# Patient Record
Sex: Male | Born: 1965
Health system: Southern US, Community
[De-identification: ages and names within clinical notes are randomized; demographics above are authoritative.]

## PROBLEM LIST (undated history)

## (undated) DIAGNOSIS — I10 Essential (primary) hypertension: Secondary | ICD-10-CM

## (undated) DIAGNOSIS — E119 Type 2 diabetes mellitus without complications: Secondary | ICD-10-CM

## (undated) DIAGNOSIS — I4891 Unspecified atrial fibrillation: Secondary | ICD-10-CM

## (undated) DIAGNOSIS — E785 Hyperlipidemia, unspecified: Secondary | ICD-10-CM

## (undated) DIAGNOSIS — G4733 Obstructive sleep apnea (adult) (pediatric): Secondary | ICD-10-CM

## (undated) HISTORY — PX: TONSILLECTOMY AND ADENOIDECTOMY: SUR1326

## (undated) HISTORY — DX: Essential (primary) hypertension: I10

## (undated) HISTORY — DX: Obstructive sleep apnea (adult) (pediatric): G47.33

## (undated) HISTORY — PX: TOTAL KNEE ARTHROPLASTY: SHX125

## (undated) HISTORY — PX: APPENDECTOMY: SHX54

---

## 1992-04-29 HISTORY — PX: VASECTOMY: SHX75

## 2006-01-15 ENCOUNTER — Ambulatory Visit (HOSPITAL_BASED_OUTPATIENT_CLINIC_OR_DEPARTMENT_OTHER): Admission: RE | Admit: 2006-01-15 | Discharge: 2006-01-15 | Payer: Self-pay | Admitting: Orthopedic Surgery

## 2007-04-30 HISTORY — PX: PALATE / UVULA BIOPSY / EXCISION: SUR128

## 2009-05-05 ENCOUNTER — Ambulatory Visit: Payer: Self-pay | Admitting: Cardiovascular Disease

## 2009-05-23 ENCOUNTER — Telehealth: Payer: Self-pay | Admitting: Cardiovascular Disease

## 2009-05-29 ENCOUNTER — Ambulatory Visit: Payer: Self-pay | Admitting: Cardiovascular Disease

## 2010-05-29 NOTE — Progress Notes (Signed)
  Phone Note Outgoing Call   Call placed by: Rhett Bannister LPN Call placed to: Patient Summary of Call: LM with daughter...ultrasound of heart was normal per Dr. Zenia Resides.Marland KitchenMarland Kitchen

## 2010-09-11 NOTE — Assessment & Plan Note (Signed)
[facility] OFFICE NOTE   NAME:[name], [name]                          MRN:          [id number]  DATE:05/29/2009                            DOB:          Dec 14, 1965    PROBLEM LIST:  1. Hypertension.  2. Obstructive sleep apnea.  3. Family history of premature coronary artery disease.  4. History of tobacco use, now quit for 1 year.   INTERVAL HISTORY:  The patient states since his last visit, he has not  had anymore episodes of the fleeting chest pain and his arm discomfort.  He remains very active at work and tolerates the activity very well.  He  also denies any dyspnea on exertion.  He states during a stress test, he  exercised for as long as he could and stopped secondary to burning in  his legs and some dyspnea.  The patient states that he is very motivated  to lose weight as he has done so in the past and he has felt more  psychologically equipped since he has regained employment.   PHYSICAL EXAMINATION:  VITAL SIGNS:  His blood pressure is 133/79, his  pulse is 71, he is sating 98% on room air, and he weighs 289 pounds.  GENERAL:  He is in no acute distress.  HEENT:  Normocephalic, atraumatic.  NECK:  Supple.  HEART:  Regular rate and rhythm with distant heart sounds, but no  murmur.  LUNGS:  Clear bilaterally.  ABDOMEN:  Soft, nontender, but obese.  EXTREMITIES:  Without edema.  SKIN:  Warm and dry.  PSYCHIATRIC:  The patient is appropriate with normal levels of insight.   Review of the patient's echocardiogram dated January 18, his ejection  fraction 65-70%.  There was mild concentric left ventricular hypertrophy  and no significant valvular abnormalities.  Review of the patient's  stress test, the EKG portion of the stress test indicates that he  exercised for only a minute and half.  However, he states that it was  end-stage III at a Bruce protocol and greater than 7 METs.  I suspect  that  the time of exercise is a typo.  There were no EKG changes  suggestive of ischemia and he did not have any chest discomfort during  the test.  The nuclear portion of the stress test indicated an ejection  fraction of 49% and perhaps some ventricular dilatation.  However, the  echocardiogram showed a normal-sized ventricle.  Of note, the stress  test did not speak of transient ischemic dilatation, simply referred to  a somewhat dilated ventricle.  The nuclear portion also indicated that  there may be some ischemia apically.   EKG taken today in clinic independently interpreted by myself  demonstrates normal sinus rhythm with T-wave inversion in the anterior  and lateral leads.  There is borderline criteria for left ventricular  hypertrophy   ASSESSMENT:  45 year old white male with hypertension, family history of  premature coronary artery disease, and history of tobacco use who  appears to be asymptomatic from a cardiovascular standpoint.  His EKG  abnormalities may  be secondary to left ventricular hypertrophy, although  his stress test did note a low normal ejection fraction in the  echocardiogram which would be more accurate in this regard showed his EF  and left ventricular chamber size to be completely within normal limits.  The possible apical ischemia is likely to be false positive as there is  usually dropout near the apex and the patient's obese abdomen may be  contributing to this.  Most importantly the patient is not having any  symptoms currently.   PLAN:  We will need to obtain an old EKG to see if these T-wave  abnormalities are new or chronic.  I will also ask for the EKG portion  the stress test to be sent to the office for my review to clarify the  actual amount of time that he exercised into the Bruce protocol.  Should  the EKG changes be chronic and if he exercised a reasonable amount of  time, I feel that we can stop the ischemia evaluation now.  However, if  the  EKG changes are completely normal and his exercise capacity was  suboptimal, we would likely proceed with a left heart catheterization.  For now, he should continue on current antihypertensive therapy which is  showing his blood pressure very well.  He should also continue on baby  aspirin.  His hemoglobin A1c was elevated 6.4 and he is told that  exercise, dietary modification, and weight loss are necessary and would  likely correct his impaired fasting glucose.  We will contact the  patient later in a week once we have old EKGs and a copy of the stress  test.  He is given a refill for losartan today.      Arlee Muslim, MD  Electronically Signed    SGA/MedQ  DD: 05/29/2009  DT: 05/29/2009  Job #: [id number]

## 2010-09-11 NOTE — Letter (Signed)
May 05, 2009    Ann Held, M.D.  [address]  [place], [place]  [address]   RE:  RODRICUS, DEDRICK  MRN:  [id number]  /  DOB:  13-Sep-1965   Dear Dr. Nicki Reaper:   I saw Prentice Docker in clinic this morning.  As you know, he is a 45-year-  old gentleman with obstructive sleep apnea and hypertension, who on a  recent EKG had criteria consistent with left ventricular hypertrophy and  also had some ST-T wave abnormalities.  These are likely repolarization  abnormalities secondary to left ventricular hypertrophy.  The patient  conveys symptoms of chest discomfort which, albeit are atypical, warrant  an ischemia evaluation, especially in light of his cardiovascular risk  factors of tobacco use, hypertension, and family history of premature  coronary disease.   We will proceed with an exercise Cardiolite and a transthoracic  echocardiogram.  I have also started the patient on Maxzide 37.5/25 mg  today, in addition to losartan that you had put him on late last month.  His blood pressure was 158/99 today.   He was counseled at length regarding the need for lifestyle modification  and understands this and is willing to be an active participant in the  process.   Thank you for the referral of this patient and I look forward to  following him along with you.    Sincerely,      Arlee Muslim, MD  Electronically Signed    SGA/MedQ  DD: 05/05/2009  DT: 05/05/2009  Job #: [id number]

## 2010-09-11 NOTE — Assessment & Plan Note (Signed)
[facility] OFFICE NOTE   NAME:[name] [name], [name] [name]                          MRN:          [id number]  DATE:05/05/2009                            DOB:          [birth year]    CHIEF COMPLAINT:  Abnormal EKG.   HISTORY OF PRESENT ILLNESS:  [name] [name] is a 45 year old white male with  past medical history significant for obstructive sleep apnea,  hypertension, gastroesophageal reflux disease, who is presenting for  evaluation of some atypical chest discomfort and an abnormal EKG.  The  patient has had a cardiovascular workup about 3-5 years ago including a  stress test and an echo that was within normal limits.  Recently, his  primary care physician, Dr. Nicki Reaper, noted an EKG that indicated probable  left ventricular hypertrophy and abnormal T-waves in ST segments.  Today, the patient states that over the past few months he has had  occasional sharp, fleeting chest, and left arm discomfort.  There are no  associated symptoms and they are not necessarily related to exertion.  The patient also noticed increased dyspnea on exertion, however, he has  recently gained approximately 20 pounds.  He was laid off from work and  became depressed.  Since that time, he has regained employment and has  felt better psychologically over the past 4 months.  The patient also  states that if he stands up too quickly he feels pulsations throughout  his body and then sometimes gets lightheaded.   PAST MEDICAL HISTORY:  As above in the HPI.  It should be noted the  patient uses CPAP regularly for obstructive sleep apnea and has greatly  improved his symptoms.   SOCIAL HISTORY:  The patient stopped smoking in May after some  hyperplasia was noticed on his tongue.  He does not drink significant  amounts of alcohol.   FAMILY HISTORY:  He was told that his father had a myocardial infarction  at the age of 2 and subsequently had several other  heart attacks.   ALLERGIES:  No known drug allergies.   MEDICATIONS:  Aspirin 81 mg daily and losartan 100 mg daily both of  which were started at the end of December by Dr. Nicki Reaper.   REVIEW OF SYSTEMS:  As in the HPI.  The patient also endorses some  discoloration on his lower extremities and some occasional tingling in  his toes and feet.  He has arthritis in his knees, occasional reflux,  migraine headaches.  Other systems as in HPI, otherwise negative.   PHYSICAL EXAMINATION:  VITAL SIGNS:  Blood pressure in the right arm  158/99, left arm 155/91, pulse 69, weighs 288 pounds, and he is sating  96% on room air.  GENERAL:  No acute distress.  HEENT:  Normocephalic, atraumatic.  NECK:  Large but supple.  There is 2+ carotid pulses.  There are no  carotid bruits or JVD.  HEART:  Regular rate and rhythm without murmur, rub, or gallop.  LUNGS:  Clear bilaterally.  ABDOMEN:  Soft, nontender, nondistended.  EXTREMITIES:  Without edema.  SKIN:  Warm and  dry.  NEUROLOGIC:  Nonfocal.  MUSCULOSKELETAL:  5/5 bilateral upper and lower extremity strength.  Pulses 2+ bilateral, carotid, radial, and dorsalis pedis pulses.   EKG from today independently interrupted by myself indicates normal  sinus rhythm with borderline criteria for left ventricular hypertrophy  with diffuse T-wave inversions that are likely secondary to  repolarization abnormality.  However, ischemia cannot be ruled out.  Review of the patient's labs from April 20, 2009, total cholesterol  is 171, HDL 31, triglyceride 221, LDL 96, white count 8, hemoglobin 15,  hematocrit 45, platelet count 207, sodium 142, potassium 3.3, chloride  103, CO2 28, BUN 12, creatinine 0.8, AST 76, ALT 59, alk phos 86, and  albumin 4.3.   ASSESSMENT AND PLAN:  1. Abnormal EKG.  The ST and T-wave abnormalities are likely secondary      to left ventricular hypertrophy; however, ischemia remains a      possibility.  The patient is having   atypical chest discomfort that      is likely not from coronary artery disease.  However, it is      reasonable to proceed with an exercise Cardiolite.  This is also      reasonable as the patient has significant risk factors for coronary      artery disease and is expressing some interest in starting an      exercise program.  2. Dizziness.  This may be secondary to his blood pressure.  We will      proceed with an echocardiogram in order to evaluate his cardiac      structure and function is also be helpful in confirming left      ventricular hypertrophy.  3. Obesity.  The patient was counseled at length regarding the need      for weight loss.  He states in the past he was able to lose 30      pounds and is aware of things he must get out of his diet and      exercise he must do.  He will await the results of his exercise      Cardiolite before starting an exercise routine.  4. Lipids.  The above lipid profile was not exactly fasting as the      patient had a sugary drinks prior.  The fact that his fasting      glucose was elevated and triglycerides were elevated; however, it      does indicate some degree of prediabetes.  5. Hypokalemia.  The patient's potassium was 3.3, we will recheck a      BMP.  6. Hypertension.  The patient is on losartan 100 mg daily and is not      at goal.  We will start Maxzide 37.5/25 mg daily today and recheck      a BMP.  We will see the patient back in clinic after results of his      echo and stress test are obtained.      Arlee Muslim, MD  Electronically Signed    SGA/MedQ  DD: 05/05/2009  DT: 05/06/2009  Job #: [id number]

## 2010-09-14 NOTE — Letter (Signed)
June 02, 2009    Dr. Ann Held  [address]  [place], [place]  [address]   RE:  [name] Long, [name] Long  MRN:  [id number]  /  DOB:  01/23/1966   Dear Dr. Nicki Reaper:   I am writing to update you on the evaluation of your patient, [name] Long.  As you know, he is a 45 year old gentleman who presented with  atypical chest discomfort and an abnormal EKG.  The patient's  echocardiogram demonstrated a normal ejection fraction, normal left  ventricular chamber size, and mild concentric hypertrophy.  The patient  also had an exercise nuclear stress test which I reviewed.  He was able  to exercise to 7.5 METS without chest discomfort or EKG changes.  His  perfusion images showed no clear signs of ischemia.  I saw the patient  back in clinic and his blood pressure was improved with the addition of  Maxzide which I started at his first visit.  He will follow up with you  for continued risk factor management.   Please contact my office if I can be of further assistance.    Sincerely,      Arlee Muslim, MD  Electronically Signed    SGA/MedQ  DD: 06/02/2009  DT: 06/02/2009  Job #: [id number]

## 2010-09-14 NOTE — Op Note (Signed)
NAME:  LEBERT, VUKOVICH                 ACCOUNT NO.:  [id number]   MEDICAL RECORD NO.:  [id number]          PATIENT TYPE:  AMB   LOCATION:  [place]                          FACILITY:  [facility]   PHYSICIAN:  Estill Bamberg. Ronnie Derby, M.D. DATE OF BIRTH:  May 13, 1965   DATE OF PROCEDURE:  01/15/2006  DATE OF DISCHARGE:                                 OPERATIVE REPORT   SURGEON:  Lucey.   ASSISTANT:  None.   ANESTHESIA:  General.   PREOPERATIVE DIAGNOSIS:  Right knee osteoarthritis, medial meniscus tear.   POSTOPERATIVE DIAGNOSIS:  Right knee osteoarthritis, medial meniscus tear  plus loose bodies.   INDICATIONS FOR PROCEDURE:  The patient is a 45 year old with mechanical  symptoms, MRI evidence and radiograph evidence of degenerative changes.  Informed consent was obtained.   DESCRIPTION OF PROCEDURE:  The patient was taken to the operating room and  laid in a supine position.  Administered MAC anesthesia.  He was prepped and  draped in the usual sterile fashion.  Inferolateral and inferomedial portals  were created with a #11 blade, blunt trocar and cannula.  Diagnostic  arthroscopy revealed grade 3 and 4 chondromalacia to the depths of the  trochlea.  This was debrided and chondroplasty performed with a [employer].  The lateral side of the patella also had some grade 3  chondromalacia, so this was debrided as well.  In the medial compartment,  most of the medial femoral condyle was covered with grade 3 and some areas  with grade 4 chondromalacia, and this was debrided and chondroplasty  performed.  Medial meniscus actually looked pretty good.  Then went into the  notch.  The ACL and PCL were normal.  Then went into the lateral  compartment.  There an anterior horn meniscus tear.  This was debrided with  a [employer].  There was minimal chondromalacia in the lateral  compartment.  I then lavaged and closed with 4-0 nylon suture, dressed with  Xeroform dressing, sponge, sterile  Webril and Ace wrap.   COMPLICATIONS:  None.   DRAINS:  None.           ______________________________  Estill Bamberg. Ronnie Derby, M.D.     SDL/MEDQ  D:  01/15/2006  T:  01/15/2006  Job:  [id number]

## 2010-10-23 ENCOUNTER — Encounter: Payer: Self-pay | Admitting: Cardiovascular Disease

## 2011-01-03 ENCOUNTER — Encounter: Payer: Self-pay | Admitting: Cardiovascular Disease

## 2011-01-18 ENCOUNTER — Encounter: Payer: Self-pay | Admitting: Cardiovascular Disease

## 2011-02-27 ENCOUNTER — Encounter: Payer: Self-pay | Admitting: Cardiovascular Disease

## 2015-10-13 DIAGNOSIS — M1712 Unilateral primary osteoarthritis, left knee: Secondary | ICD-10-CM | POA: Diagnosis not present

## 2015-12-07 DIAGNOSIS — E119 Type 2 diabetes mellitus without complications: Secondary | ICD-10-CM | POA: Diagnosis not present

## 2015-12-07 DIAGNOSIS — L03032 Cellulitis of left toe: Secondary | ICD-10-CM | POA: Diagnosis not present

## 2016-01-02 DIAGNOSIS — R42 Dizziness and giddiness: Secondary | ICD-10-CM | POA: Diagnosis not present

## 2016-01-02 DIAGNOSIS — R531 Weakness: Secondary | ICD-10-CM | POA: Diagnosis not present

## 2016-01-02 DIAGNOSIS — R002 Palpitations: Secondary | ICD-10-CM | POA: Diagnosis not present

## 2016-01-02 DIAGNOSIS — I10 Essential (primary) hypertension: Secondary | ICD-10-CM | POA: Diagnosis not present

## 2016-01-08 DIAGNOSIS — Z6841 Body Mass Index (BMI) 40.0 and over, adult: Secondary | ICD-10-CM | POA: Diagnosis not present

## 2016-01-08 DIAGNOSIS — E876 Hypokalemia: Secondary | ICD-10-CM | POA: Diagnosis not present

## 2016-01-08 DIAGNOSIS — R9431 Abnormal electrocardiogram [ECG] [EKG]: Secondary | ICD-10-CM | POA: Diagnosis not present

## 2016-01-08 DIAGNOSIS — R002 Palpitations: Secondary | ICD-10-CM | POA: Diagnosis not present

## 2016-01-15 DIAGNOSIS — R002 Palpitations: Secondary | ICD-10-CM | POA: Diagnosis not present

## 2016-01-29 DIAGNOSIS — R9431 Abnormal electrocardiogram [ECG] [EKG]: Secondary | ICD-10-CM | POA: Diagnosis not present

## 2016-01-29 DIAGNOSIS — R931 Abnormal findings on diagnostic imaging of heart and coronary circulation: Secondary | ICD-10-CM | POA: Diagnosis not present

## 2016-01-29 DIAGNOSIS — R002 Palpitations: Secondary | ICD-10-CM | POA: Diagnosis not present

## 2016-01-30 DIAGNOSIS — R931 Abnormal findings on diagnostic imaging of heart and coronary circulation: Secondary | ICD-10-CM | POA: Diagnosis not present

## 2016-01-30 DIAGNOSIS — R9431 Abnormal electrocardiogram [ECG] [EKG]: Secondary | ICD-10-CM | POA: Diagnosis not present

## 2016-01-30 DIAGNOSIS — R002 Palpitations: Secondary | ICD-10-CM | POA: Diagnosis not present

## 2016-01-30 DIAGNOSIS — R079 Chest pain, unspecified: Secondary | ICD-10-CM | POA: Diagnosis not present

## 2016-02-08 DIAGNOSIS — E1165 Type 2 diabetes mellitus with hyperglycemia: Secondary | ICD-10-CM | POA: Diagnosis not present

## 2016-02-16 DIAGNOSIS — Z23 Encounter for immunization: Secondary | ICD-10-CM | POA: Diagnosis not present

## 2016-02-16 DIAGNOSIS — M1712 Unilateral primary osteoarthritis, left knee: Secondary | ICD-10-CM | POA: Diagnosis not present

## 2016-02-16 DIAGNOSIS — E1165 Type 2 diabetes mellitus with hyperglycemia: Secondary | ICD-10-CM | POA: Diagnosis not present

## 2016-02-26 DIAGNOSIS — J22 Unspecified acute lower respiratory infection: Secondary | ICD-10-CM | POA: Diagnosis not present

## 2016-03-11 DIAGNOSIS — J22 Unspecified acute lower respiratory infection: Secondary | ICD-10-CM | POA: Diagnosis not present

## 2016-07-11 DIAGNOSIS — M1712 Unilateral primary osteoarthritis, left knee: Secondary | ICD-10-CM | POA: Diagnosis not present

## 2016-10-17 DIAGNOSIS — H6121 Impacted cerumen, right ear: Secondary | ICD-10-CM | POA: Diagnosis not present

## 2016-10-17 DIAGNOSIS — E1165 Type 2 diabetes mellitus with hyperglycemia: Secondary | ICD-10-CM | POA: Diagnosis not present

## 2016-10-17 DIAGNOSIS — J0100 Acute maxillary sinusitis, unspecified: Secondary | ICD-10-CM | POA: Diagnosis not present

## 2016-11-28 DIAGNOSIS — M1712 Unilateral primary osteoarthritis, left knee: Secondary | ICD-10-CM | POA: Diagnosis not present

## 2017-03-14 DIAGNOSIS — Z1331 Encounter for screening for depression: Secondary | ICD-10-CM | POA: Diagnosis not present

## 2017-03-14 DIAGNOSIS — E1165 Type 2 diabetes mellitus with hyperglycemia: Secondary | ICD-10-CM | POA: Diagnosis not present

## 2017-03-14 DIAGNOSIS — Z23 Encounter for immunization: Secondary | ICD-10-CM | POA: Diagnosis not present

## 2017-03-14 DIAGNOSIS — Z6841 Body Mass Index (BMI) 40.0 and over, adult: Secondary | ICD-10-CM | POA: Diagnosis not present

## 2017-03-14 DIAGNOSIS — Z1339 Encounter for screening examination for other mental health and behavioral disorders: Secondary | ICD-10-CM | POA: Diagnosis not present

## 2017-04-25 DIAGNOSIS — E1165 Type 2 diabetes mellitus with hyperglycemia: Secondary | ICD-10-CM | POA: Diagnosis not present

## 2017-04-28 DIAGNOSIS — Z0000 Encounter for general adult medical examination without abnormal findings: Secondary | ICD-10-CM | POA: Diagnosis not present

## 2017-04-28 DIAGNOSIS — Z1211 Encounter for screening for malignant neoplasm of colon: Secondary | ICD-10-CM | POA: Diagnosis not present

## 2017-04-28 DIAGNOSIS — Z6841 Body Mass Index (BMI) 40.0 and over, adult: Secondary | ICD-10-CM | POA: Diagnosis not present

## 2017-05-05 DIAGNOSIS — J0100 Acute maxillary sinusitis, unspecified: Secondary | ICD-10-CM | POA: Diagnosis not present

## 2017-05-05 DIAGNOSIS — Z6841 Body Mass Index (BMI) 40.0 and over, adult: Secondary | ICD-10-CM | POA: Diagnosis not present

## 2017-06-20 DIAGNOSIS — M1712 Unilateral primary osteoarthritis, left knee: Secondary | ICD-10-CM | POA: Diagnosis not present

## 2017-07-14 DIAGNOSIS — E1165 Type 2 diabetes mellitus with hyperglycemia: Secondary | ICD-10-CM | POA: Diagnosis not present

## 2017-07-23 DIAGNOSIS — E1165 Type 2 diabetes mellitus with hyperglycemia: Secondary | ICD-10-CM | POA: Diagnosis not present

## 2017-07-23 DIAGNOSIS — J0100 Acute maxillary sinusitis, unspecified: Secondary | ICD-10-CM | POA: Diagnosis not present

## 2017-07-23 DIAGNOSIS — Z6841 Body Mass Index (BMI) 40.0 and over, adult: Secondary | ICD-10-CM | POA: Diagnosis not present

## 2017-07-23 DIAGNOSIS — N529 Male erectile dysfunction, unspecified: Secondary | ICD-10-CM | POA: Diagnosis not present

## 2017-10-22 DIAGNOSIS — Z87891 Personal history of nicotine dependence: Secondary | ICD-10-CM | POA: Diagnosis not present

## 2017-10-22 DIAGNOSIS — R42 Dizziness and giddiness: Secondary | ICD-10-CM | POA: Diagnosis not present

## 2017-10-22 DIAGNOSIS — R202 Paresthesia of skin: Secondary | ICD-10-CM | POA: Diagnosis not present

## 2017-10-22 DIAGNOSIS — Z7984 Long term (current) use of oral hypoglycemic drugs: Secondary | ICD-10-CM | POA: Diagnosis not present

## 2017-10-22 DIAGNOSIS — R002 Palpitations: Secondary | ICD-10-CM | POA: Diagnosis not present

## 2017-10-22 DIAGNOSIS — G4733 Obstructive sleep apnea (adult) (pediatric): Secondary | ICD-10-CM | POA: Diagnosis not present

## 2017-10-22 DIAGNOSIS — R51 Headache: Secondary | ICD-10-CM | POA: Diagnosis not present

## 2017-10-22 DIAGNOSIS — Z79899 Other long term (current) drug therapy: Secondary | ICD-10-CM | POA: Diagnosis not present

## 2017-10-22 DIAGNOSIS — K219 Gastro-esophageal reflux disease without esophagitis: Secondary | ICD-10-CM | POA: Diagnosis not present

## 2017-10-22 DIAGNOSIS — I10 Essential (primary) hypertension: Secondary | ICD-10-CM | POA: Diagnosis not present

## 2017-10-22 DIAGNOSIS — R079 Chest pain, unspecified: Secondary | ICD-10-CM | POA: Diagnosis not present

## 2017-10-22 DIAGNOSIS — E119 Type 2 diabetes mellitus without complications: Secondary | ICD-10-CM | POA: Diagnosis not present

## 2017-10-23 DIAGNOSIS — I10 Essential (primary) hypertension: Secondary | ICD-10-CM | POA: Diagnosis not present

## 2017-10-23 DIAGNOSIS — R002 Palpitations: Secondary | ICD-10-CM | POA: Diagnosis not present

## 2017-10-23 DIAGNOSIS — I160 Hypertensive urgency: Secondary | ICD-10-CM | POA: Diagnosis not present

## 2017-10-23 DIAGNOSIS — E119 Type 2 diabetes mellitus without complications: Secondary | ICD-10-CM | POA: Diagnosis not present

## 2017-10-23 DIAGNOSIS — G4733 Obstructive sleep apnea (adult) (pediatric): Secondary | ICD-10-CM | POA: Diagnosis not present

## 2017-10-24 DIAGNOSIS — R748 Abnormal levels of other serum enzymes: Secondary | ICD-10-CM | POA: Diagnosis not present

## 2017-10-24 DIAGNOSIS — I10 Essential (primary) hypertension: Secondary | ICD-10-CM | POA: Diagnosis not present

## 2017-10-24 DIAGNOSIS — G4733 Obstructive sleep apnea (adult) (pediatric): Secondary | ICD-10-CM | POA: Diagnosis not present

## 2017-10-24 DIAGNOSIS — E002 Congenital iodine-deficiency syndrome, mixed type: Secondary | ICD-10-CM | POA: Diagnosis not present

## 2017-10-24 DIAGNOSIS — I160 Hypertensive urgency: Secondary | ICD-10-CM | POA: Diagnosis not present

## 2017-10-24 DIAGNOSIS — R002 Palpitations: Secondary | ICD-10-CM | POA: Diagnosis not present

## 2017-10-24 DIAGNOSIS — Z9989 Dependence on other enabling machines and devices: Secondary | ICD-10-CM | POA: Diagnosis not present

## 2017-10-31 DIAGNOSIS — R252 Cramp and spasm: Secondary | ICD-10-CM | POA: Diagnosis not present

## 2017-10-31 DIAGNOSIS — Z6841 Body Mass Index (BMI) 40.0 and over, adult: Secondary | ICD-10-CM | POA: Diagnosis not present

## 2017-10-31 DIAGNOSIS — E119 Type 2 diabetes mellitus without complications: Secondary | ICD-10-CM | POA: Diagnosis not present

## 2017-10-31 DIAGNOSIS — I10 Essential (primary) hypertension: Secondary | ICD-10-CM | POA: Diagnosis not present

## 2017-11-06 DIAGNOSIS — Z6841 Body Mass Index (BMI) 40.0 and over, adult: Secondary | ICD-10-CM | POA: Diagnosis not present

## 2017-11-06 DIAGNOSIS — Z23 Encounter for immunization: Secondary | ICD-10-CM | POA: Diagnosis not present

## 2017-11-06 DIAGNOSIS — S81802A Unspecified open wound, left lower leg, initial encounter: Secondary | ICD-10-CM | POA: Diagnosis not present

## 2017-12-08 DIAGNOSIS — M1712 Unilateral primary osteoarthritis, left knee: Secondary | ICD-10-CM | POA: Diagnosis not present

## 2018-02-09 DIAGNOSIS — Z6841 Body Mass Index (BMI) 40.0 and over, adult: Secondary | ICD-10-CM | POA: Diagnosis not present

## 2018-02-09 DIAGNOSIS — L03119 Cellulitis of unspecified part of limb: Secondary | ICD-10-CM | POA: Diagnosis not present

## 2018-04-27 DIAGNOSIS — Z13220 Encounter for screening for lipoid disorders: Secondary | ICD-10-CM | POA: Diagnosis not present

## 2018-04-27 DIAGNOSIS — J40 Bronchitis, not specified as acute or chronic: Secondary | ICD-10-CM | POA: Diagnosis not present

## 2018-04-27 DIAGNOSIS — Z0000 Encounter for general adult medical examination without abnormal findings: Secondary | ICD-10-CM | POA: Diagnosis not present

## 2018-04-27 DIAGNOSIS — J329 Chronic sinusitis, unspecified: Secondary | ICD-10-CM | POA: Diagnosis not present

## 2018-04-27 DIAGNOSIS — Z125 Encounter for screening for malignant neoplasm of prostate: Secondary | ICD-10-CM | POA: Diagnosis not present

## 2018-04-27 DIAGNOSIS — Z131 Encounter for screening for diabetes mellitus: Secondary | ICD-10-CM | POA: Diagnosis not present

## 2018-05-12 DIAGNOSIS — M1712 Unilateral primary osteoarthritis, left knee: Secondary | ICD-10-CM | POA: Diagnosis not present

## 2018-06-03 DIAGNOSIS — J40 Bronchitis, not specified as acute or chronic: Secondary | ICD-10-CM | POA: Diagnosis not present

## 2018-06-03 DIAGNOSIS — J329 Chronic sinusitis, unspecified: Secondary | ICD-10-CM | POA: Diagnosis not present

## 2018-06-03 DIAGNOSIS — S81802A Unspecified open wound, left lower leg, initial encounter: Secondary | ICD-10-CM | POA: Diagnosis not present

## 2018-06-03 DIAGNOSIS — R05 Cough: Secondary | ICD-10-CM | POA: Diagnosis not present

## 2018-06-03 DIAGNOSIS — E1165 Type 2 diabetes mellitus with hyperglycemia: Secondary | ICD-10-CM | POA: Diagnosis not present

## 2018-07-27 DIAGNOSIS — R509 Fever, unspecified: Secondary | ICD-10-CM | POA: Diagnosis not present

## 2018-07-29 ENCOUNTER — Encounter (HOSPITAL_COMMUNITY): Payer: Self-pay | Admitting: Internal Medicine

## 2018-07-29 ENCOUNTER — Other Ambulatory Visit: Payer: Self-pay

## 2018-07-29 ENCOUNTER — Inpatient Hospital Stay (HOSPITAL_COMMUNITY)
Admission: AD | Admit: 2018-07-29 | Discharge: 2018-08-28 | DRG: 207 | Disposition: E | Payer: BLUE CROSS/BLUE SHIELD | Source: Other Acute Inpatient Hospital | Attending: Pulmonary Disease | Admitting: Pulmonary Disease

## 2018-07-29 DIAGNOSIS — R918 Other nonspecific abnormal finding of lung field: Secondary | ICD-10-CM | POA: Diagnosis not present

## 2018-07-29 DIAGNOSIS — J80 Acute respiratory distress syndrome: Secondary | ICD-10-CM | POA: Diagnosis not present

## 2018-07-29 DIAGNOSIS — G9341 Metabolic encephalopathy: Secondary | ICD-10-CM | POA: Diagnosis not present

## 2018-07-29 DIAGNOSIS — G4733 Obstructive sleep apnea (adult) (pediatric): Secondary | ICD-10-CM | POA: Diagnosis present

## 2018-07-29 DIAGNOSIS — L8990 Pressure ulcer of unspecified site, unspecified stage: Secondary | ICD-10-CM

## 2018-07-29 DIAGNOSIS — D62 Acute posthemorrhagic anemia: Secondary | ICD-10-CM | POA: Diagnosis not present

## 2018-07-29 DIAGNOSIS — R509 Fever, unspecified: Secondary | ICD-10-CM | POA: Diagnosis not present

## 2018-07-29 DIAGNOSIS — R1030 Lower abdominal pain, unspecified: Secondary | ICD-10-CM | POA: Diagnosis not present

## 2018-07-29 DIAGNOSIS — E874 Mixed disorder of acid-base balance: Secondary | ICD-10-CM | POA: Diagnosis not present

## 2018-07-29 DIAGNOSIS — R402363 Coma scale, best motor response, obeys commands, at hospital admission: Secondary | ICD-10-CM | POA: Diagnosis present

## 2018-07-29 DIAGNOSIS — D6859 Other primary thrombophilia: Secondary | ICD-10-CM | POA: Diagnosis not present

## 2018-07-29 DIAGNOSIS — K760 Fatty (change of) liver, not elsewhere classified: Secondary | ICD-10-CM | POA: Diagnosis present

## 2018-07-29 DIAGNOSIS — I10 Essential (primary) hypertension: Secondary | ICD-10-CM | POA: Diagnosis not present

## 2018-07-29 DIAGNOSIS — E1122 Type 2 diabetes mellitus with diabetic chronic kidney disease: Secondary | ICD-10-CM | POA: Diagnosis present

## 2018-07-29 DIAGNOSIS — I1311 Hypertensive heart and chronic kidney disease without heart failure, with stage 5 chronic kidney disease, or end stage renal disease: Secondary | ICD-10-CM | POA: Diagnosis present

## 2018-07-29 DIAGNOSIS — D696 Thrombocytopenia, unspecified: Secondary | ICD-10-CM | POA: Diagnosis present

## 2018-07-29 DIAGNOSIS — J069 Acute upper respiratory infection, unspecified: Secondary | ICD-10-CM | POA: Diagnosis not present

## 2018-07-29 DIAGNOSIS — Z87891 Personal history of nicotine dependence: Secondary | ICD-10-CM

## 2018-07-29 DIAGNOSIS — Z96653 Presence of artificial knee joint, bilateral: Secondary | ICD-10-CM | POA: Diagnosis present

## 2018-07-29 DIAGNOSIS — A4189 Other specified sepsis: Secondary | ICD-10-CM | POA: Diagnosis not present

## 2018-07-29 DIAGNOSIS — J9600 Acute respiratory failure, unspecified whether with hypoxia or hypercapnia: Secondary | ICD-10-CM

## 2018-07-29 DIAGNOSIS — L89150 Pressure ulcer of sacral region, unstageable: Secondary | ICD-10-CM | POA: Diagnosis not present

## 2018-07-29 DIAGNOSIS — Z7982 Long term (current) use of aspirin: Secondary | ICD-10-CM

## 2018-07-29 DIAGNOSIS — M1990 Unspecified osteoarthritis, unspecified site: Secondary | ICD-10-CM | POA: Diagnosis not present

## 2018-07-29 DIAGNOSIS — E8770 Fluid overload, unspecified: Secondary | ICD-10-CM | POA: Diagnosis not present

## 2018-07-29 DIAGNOSIS — N179 Acute kidney failure, unspecified: Secondary | ICD-10-CM | POA: Diagnosis not present

## 2018-07-29 DIAGNOSIS — Z515 Encounter for palliative care: Secondary | ICD-10-CM | POA: Diagnosis not present

## 2018-07-29 DIAGNOSIS — D703 Neutropenia due to infection: Secondary | ICD-10-CM | POA: Diagnosis not present

## 2018-07-29 DIAGNOSIS — E785 Hyperlipidemia, unspecified: Secondary | ICD-10-CM | POA: Diagnosis present

## 2018-07-29 DIAGNOSIS — Z66 Do not resuscitate: Secondary | ICD-10-CM | POA: Diagnosis not present

## 2018-07-29 DIAGNOSIS — Z978 Presence of other specified devices: Secondary | ICD-10-CM

## 2018-07-29 DIAGNOSIS — K659 Peritonitis, unspecified: Secondary | ICD-10-CM | POA: Diagnosis not present

## 2018-07-29 DIAGNOSIS — Z6841 Body Mass Index (BMI) 40.0 and over, adult: Secondary | ICD-10-CM

## 2018-07-29 DIAGNOSIS — R402133 Coma scale, eyes open, to sound, at hospital admission: Secondary | ICD-10-CM | POA: Diagnosis present

## 2018-07-29 DIAGNOSIS — K5732 Diverticulitis of large intestine without perforation or abscess without bleeding: Secondary | ICD-10-CM | POA: Diagnosis not present

## 2018-07-29 DIAGNOSIS — E872 Acidosis: Secondary | ICD-10-CM | POA: Diagnosis not present

## 2018-07-29 DIAGNOSIS — N186 End stage renal disease: Secondary | ICD-10-CM | POA: Diagnosis present

## 2018-07-29 DIAGNOSIS — R05 Cough: Secondary | ICD-10-CM | POA: Diagnosis not present

## 2018-07-29 DIAGNOSIS — R161 Splenomegaly, not elsewhere classified: Secondary | ICD-10-CM | POA: Diagnosis present

## 2018-07-29 DIAGNOSIS — T82818A Embolism of vascular prosthetic devices, implants and grafts, initial encounter: Secondary | ICD-10-CM | POA: Diagnosis not present

## 2018-07-29 DIAGNOSIS — N170 Acute kidney failure with tubular necrosis: Secondary | ICD-10-CM | POA: Diagnosis not present

## 2018-07-29 DIAGNOSIS — R140 Abdominal distension (gaseous): Secondary | ICD-10-CM

## 2018-07-29 DIAGNOSIS — Z809 Family history of malignant neoplasm, unspecified: Secondary | ICD-10-CM

## 2018-07-29 DIAGNOSIS — R001 Bradycardia, unspecified: Secondary | ICD-10-CM | POA: Diagnosis not present

## 2018-07-29 DIAGNOSIS — E119 Type 2 diabetes mellitus without complications: Secondary | ICD-10-CM

## 2018-07-29 DIAGNOSIS — R579 Shock, unspecified: Secondary | ICD-10-CM | POA: Diagnosis not present

## 2018-07-29 DIAGNOSIS — Z8249 Family history of ischemic heart disease and other diseases of the circulatory system: Secondary | ICD-10-CM

## 2018-07-29 DIAGNOSIS — R6521 Severe sepsis with septic shock: Secondary | ICD-10-CM | POA: Diagnosis not present

## 2018-07-29 DIAGNOSIS — R0603 Acute respiratory distress: Secondary | ICD-10-CM

## 2018-07-29 DIAGNOSIS — J9601 Acute respiratory failure with hypoxia: Secondary | ICD-10-CM

## 2018-07-29 DIAGNOSIS — J189 Pneumonia, unspecified organism: Secondary | ICD-10-CM

## 2018-07-29 DIAGNOSIS — J9690 Respiratory failure, unspecified, unspecified whether with hypoxia or hypercapnia: Secondary | ICD-10-CM

## 2018-07-29 DIAGNOSIS — K5649 Other impaction of intestine: Secondary | ICD-10-CM

## 2018-07-29 DIAGNOSIS — A419 Sepsis, unspecified organism: Secondary | ICD-10-CM | POA: Diagnosis not present

## 2018-07-29 DIAGNOSIS — K5720 Diverticulitis of large intestine with perforation and abscess without bleeding: Secondary | ICD-10-CM | POA: Diagnosis present

## 2018-07-29 DIAGNOSIS — D709 Neutropenia, unspecified: Secondary | ICD-10-CM | POA: Diagnosis present

## 2018-07-29 DIAGNOSIS — E876 Hypokalemia: Secondary | ICD-10-CM | POA: Diagnosis not present

## 2018-07-29 DIAGNOSIS — J1289 Other viral pneumonia: Secondary | ICD-10-CM | POA: Diagnosis not present

## 2018-07-29 DIAGNOSIS — I480 Paroxysmal atrial fibrillation: Secondary | ICD-10-CM | POA: Diagnosis present

## 2018-07-29 DIAGNOSIS — E1129 Type 2 diabetes mellitus with other diabetic kidney complication: Secondary | ICD-10-CM | POA: Diagnosis not present

## 2018-07-29 DIAGNOSIS — Y832 Surgical operation with anastomosis, bypass or graft as the cause of abnormal reaction of the patient, or of later complication, without mention of misadventure at the time of the procedure: Secondary | ICD-10-CM | POA: Diagnosis not present

## 2018-07-29 DIAGNOSIS — Z452 Encounter for adjustment and management of vascular access device: Secondary | ICD-10-CM

## 2018-07-29 DIAGNOSIS — Z9911 Dependence on respirator [ventilator] status: Secondary | ICD-10-CM

## 2018-07-29 DIAGNOSIS — R34 Anuria and oliguria: Secondary | ICD-10-CM | POA: Diagnosis not present

## 2018-07-29 DIAGNOSIS — J1281 Pneumonia due to SARS-associated coronavirus: Secondary | ICD-10-CM | POA: Diagnosis not present

## 2018-07-29 DIAGNOSIS — R402233 Coma scale, best verbal response, inappropriate words, at hospital admission: Secondary | ICD-10-CM | POA: Diagnosis present

## 2018-07-29 DIAGNOSIS — E875 Hyperkalemia: Secondary | ICD-10-CM | POA: Diagnosis not present

## 2018-07-29 DIAGNOSIS — Z01818 Encounter for other preprocedural examination: Secondary | ICD-10-CM

## 2018-07-29 HISTORY — DX: Hyperlipidemia, unspecified: E78.5

## 2018-07-29 HISTORY — DX: Unspecified atrial fibrillation: I48.91

## 2018-07-29 HISTORY — DX: Type 2 diabetes mellitus without complications: E11.9

## 2018-07-29 LAB — GLUCOSE, CAPILLARY: Glucose-Capillary: 132 mg/dL — ABNORMAL HIGH (ref 70–99)

## 2018-07-29 MED ORDER — ALBUTEROL SULFATE HFA 108 (90 BASE) MCG/ACT IN AERS: 2.0000 | INHALATION_SPRAY | RESPIRATORY_TRACT | Status: DC | PRN

## 2018-07-29 MED ORDER — ACETAMINOPHEN 325 MG PO TABS
650.0000 mg | ORAL_TABLET | Freq: Four times a day (QID) | ORAL | Status: DC | PRN
  Administered 2018-07-30 (×2): 650 mg via ORAL
  Filled 2018-07-29 (×2): qty 2

## 2018-07-29 MED ORDER — ENOXAPARIN SODIUM 40 MG/0.4ML ~~LOC~~ SOLN: 40.0000 mg | Freq: Every day | SUBCUTANEOUS | Status: DC

## 2018-07-29 MED ORDER — SODIUM CHLORIDE 0.9 % IV SOLN
INTRAVENOUS | Status: DC
  Administered 2018-07-30: 03:00:00 via INTRAVENOUS

## 2018-07-29 MED ORDER — ONDANSETRON HCL 4 MG/2ML IJ SOLN
4.0000 mg | Freq: Four times a day (QID) | INTRAMUSCULAR | Status: DC | PRN
  Administered 2018-07-30 (×2): 4 mg via INTRAVENOUS
  Filled 2018-07-29 (×2): qty 2

## 2018-07-29 MED ORDER — MORPHINE SULFATE (PF) 4 MG/ML IV SOLN
4.0000 mg | INTRAVENOUS | Status: DC | PRN
  Administered 2018-07-30 (×3): 4 mg via INTRAVENOUS
  Filled 2018-07-29 (×3): qty 1

## 2018-07-29 MED ORDER — INSULIN ASPART 100 UNIT/ML ~~LOC~~ SOLN
0.0000 [IU] | SUBCUTANEOUS | Status: DC
  Administered 2018-07-30 (×3): 1 [IU] via SUBCUTANEOUS
  Administered 2018-07-31: 2 [IU] via SUBCUTANEOUS
  Administered 2018-07-31: 1 [IU] via SUBCUTANEOUS
  Administered 2018-07-31 (×2): 2 [IU] via SUBCUTANEOUS
  Administered 2018-08-01 (×3): 1 [IU] via SUBCUTANEOUS
  Administered 2018-08-02: 01:00:00 2 [IU] via SUBCUTANEOUS
  Administered 2018-08-02: 1 [IU] via SUBCUTANEOUS
  Administered 2018-08-02: 12:00:00 2 [IU] via SUBCUTANEOUS
  Administered 2018-08-02: 21:00:00 1 [IU] via SUBCUTANEOUS
  Administered 2018-08-02: 08:00:00 2 [IU] via SUBCUTANEOUS
  Administered 2018-08-02 (×2): 1 [IU] via SUBCUTANEOUS
  Administered 2018-08-03: 20:00:00 2 [IU] via SUBCUTANEOUS
  Administered 2018-08-03 (×2): 1 [IU] via SUBCUTANEOUS
  Administered 2018-08-03: 2 [IU] via SUBCUTANEOUS
  Administered 2018-08-03 – 2018-08-04 (×2): 1 [IU] via SUBCUTANEOUS
  Administered 2018-08-04: 3 [IU] via SUBCUTANEOUS
  Administered 2018-08-04: 20:00:00 1 [IU] via SUBCUTANEOUS
  Administered 2018-08-04: 2 [IU] via SUBCUTANEOUS
  Administered 2018-08-04: 3 [IU] via SUBCUTANEOUS
  Administered 2018-08-04 – 2018-08-07 (×10): 1 [IU] via SUBCUTANEOUS
  Administered 2018-08-07: 2 [IU] via SUBCUTANEOUS
  Administered 2018-08-07 – 2018-08-08 (×2): 1 [IU] via SUBCUTANEOUS
  Administered 2018-08-08: 2 [IU] via SUBCUTANEOUS
  Administered 2018-08-08 – 2018-08-09 (×6): 1 [IU] via SUBCUTANEOUS
  Administered 2018-08-09 (×3): 2 [IU] via SUBCUTANEOUS
  Administered 2018-08-10 (×2): 1 [IU] via SUBCUTANEOUS
  Administered 2018-08-10 (×2): 2 [IU] via SUBCUTANEOUS
  Administered 2018-08-10: 1 [IU] via SUBCUTANEOUS
  Administered 2018-08-11 (×2): 2 [IU] via SUBCUTANEOUS
  Administered 2018-08-11 (×2): 1 [IU] via SUBCUTANEOUS
  Administered 2018-08-11: 13:00:00 2 [IU] via SUBCUTANEOUS
  Administered 2018-08-11 – 2018-08-12 (×3): 1 [IU] via SUBCUTANEOUS
  Administered 2018-08-12 (×2): 2 [IU] via SUBCUTANEOUS
  Administered 2018-08-12 (×2): 3 [IU] via SUBCUTANEOUS
  Administered 2018-08-13: 1 [IU] via SUBCUTANEOUS
  Administered 2018-08-13 (×2): 2 [IU] via SUBCUTANEOUS
  Administered 2018-08-13: 20:00:00 3 [IU] via SUBCUTANEOUS
  Administered 2018-08-13: 2 [IU] via SUBCUTANEOUS
  Administered 2018-08-14 (×2): 3 [IU] via SUBCUTANEOUS
  Administered 2018-08-14: 2 [IU] via SUBCUTANEOUS
  Administered 2018-08-14: 3 [IU] via SUBCUTANEOUS
  Administered 2018-08-14: 08:00:00 2 [IU] via SUBCUTANEOUS
  Administered 2018-08-14: 3 [IU] via SUBCUTANEOUS
  Administered 2018-08-14 – 2018-08-15 (×2): 2 [IU] via SUBCUTANEOUS
  Administered 2018-08-15: 3 [IU] via SUBCUTANEOUS
  Administered 2018-08-15 (×3): 2 [IU] via SUBCUTANEOUS
  Administered 2018-08-15 – 2018-08-16 (×2): 3 [IU] via SUBCUTANEOUS
  Administered 2018-08-16: 1 [IU] via SUBCUTANEOUS
  Administered 2018-08-16: 2 [IU] via SUBCUTANEOUS
  Administered 2018-08-16: 3 [IU] via SUBCUTANEOUS
  Administered 2018-08-16 (×2): 2 [IU] via SUBCUTANEOUS
  Administered 2018-08-17 (×4): 3 [IU] via SUBCUTANEOUS
  Administered 2018-08-17: 2 [IU] via SUBCUTANEOUS
  Administered 2018-08-17: 5 [IU] via SUBCUTANEOUS
  Administered 2018-08-18 (×4): 2 [IU] via SUBCUTANEOUS
  Administered 2018-08-18: 5 [IU] via SUBCUTANEOUS
  Administered 2018-08-18 – 2018-08-19 (×2): 3 [IU] via SUBCUTANEOUS
  Administered 2018-08-19: 5 [IU] via SUBCUTANEOUS
  Administered 2018-08-19 (×2): 2 [IU] via SUBCUTANEOUS
  Administered 2018-08-19 – 2018-08-20 (×4): 3 [IU] via SUBCUTANEOUS
  Administered 2018-08-20: 08:00:00 2 [IU] via SUBCUTANEOUS
  Administered 2018-08-20: 5 [IU] via SUBCUTANEOUS
  Administered 2018-08-20 (×2): 3 [IU] via SUBCUTANEOUS
  Administered 2018-08-21: 5 [IU] via SUBCUTANEOUS
  Administered 2018-08-21: 3 [IU] via SUBCUTANEOUS
  Administered 2018-08-21 (×2): 5 [IU] via SUBCUTANEOUS
  Administered 2018-08-21 – 2018-08-22 (×4): 3 [IU] via SUBCUTANEOUS
  Administered 2018-08-22: 12:00:00 7 [IU] via SUBCUTANEOUS
  Administered 2018-08-22: 08:00:00 5 [IU] via SUBCUTANEOUS

## 2018-07-29 MED ORDER — ONDANSETRON HCL 4 MG PO TABS: 4.0000 mg | ORAL_TABLET | Freq: Four times a day (QID) | ORAL | Status: DC | PRN

## 2018-07-29 NOTE — H&P (Signed)
History and Physical    [name] Long NID:[id number] DOB: 04/06/1966 DOA: 08/17/2018  PCP: Patient, No Pcp Per  Patient coming from: [facility] ED  I have personally briefly reviewed patient's old medical records in [place]  Chief Complaint: COVID-19, and abd pain  HPI: [name] Long is a 53 y.o. male with medical history significant of DM2, HTN, OSA on CPAP.  Patient has been having lower abd pain since Sunday.  Patient was experiencing fever, nausea, dry heaves, loss of appetite, diarrhea.  Tested for COVID-19 on Monday.  Trying tylenol for abd pain without much relief.  Of note, patient reports not taking any of his home PO meds for several days due to illness.   ED Course: CTA chest showed findings c/w "acute atypical infection.  In particular viral pneumonia (including COVID-19 should be considered..."  CT abd/pelvis showed: Perforated diverticulitis, localized extraluminal air, no abscess.  Hepatic steatosis, possible component of cirrhosis, and splenomegaly.  Splenomegaly and CTA chest findings are readily explained when during the ED visit, his PCP is called and it turns out that is COVID-19 is POSITIVE!  Transferred to [facility] because apparently [facility] isnt admitting anyone with possible COVID right now.  Procalcitonin 0.46, CRP elevated, WBC 3.2k.  lDH slightly elevated at 745, Trop 0.08 (their assay is positive > 0.40).   Review of Systems: As per HPI otherwise 10 point review of systems negative.   Past Medical History:  Diagnosis Date  . A-fib ([place])   . DM2 (diabetes mellitus, type 2) ([place])   . HTN (hypertension)   . OSA (obstructive sleep apnea)     Past Surgical History:  Procedure Laterality Date  . APPENDECTOMY  age 59  . PALATE / UVULA BIOPSY / EXCISION  2009  . TONSILLECTOMY AND ADENOIDECTOMY  age 29  . TOTAL KNEE ARTHROPLASTY  1996 / 2008   bilateral  . VASECTOMY  1994     reports that he quit smoking about 9 years ago. He does not have any smokeless tobacco history  on file. He reports current alcohol use. He reports that he does not use drugs.  No Known Allergies  Family History  Problem Relation Age of Onset  . Heart disease Other   . Heart disease Father   . Cancer Mother   . Heart disease Mother   . Hypertension Mother      Prior to Admission medications   Medication Sig Start Date End Date Taking? Authorizing Provider  aspirin 81 MG tablet Take 81 mg by mouth daily.      [provider]  fish oil-omega-3 fatty acids 1000 MG capsule Take 2 g by mouth daily.      [provider]  losartan (COZAAR) 100 MG tablet Take 100 mg by mouth daily.      [provider]  triamterene-hydrochlorothiazide (MAXZIDE-25) 37.5-25 MG per tablet Take 1 tablet by mouth daily.      [provider]    Physical Exam: Vitals:   08/08/2018 2147  BP: (!) 155/78  Pulse: (!) 57  Resp: (!) 21  Temp: 100.1 F (37.8 C)  TempSrc: Oral  SpO2: 93%  Weight: 133.8 kg  Height: 5\' 10"  (1.778 m)    Constitutional: NAD, calm, comfortable Eyes: PERRL, lids and conjunctivae normal ENMT: Mucous membranes are moist. Posterior pharynx clear of any exudate or lesions.Normal dentition.  Neck: normal, supple, no masses, no thyromegaly Respiratory: clear to auscultation bilaterally, no wheezing, no crackles. Normal respiratory effort. No accessory muscle use.  Cardiovascular: Regular rate and rhythm, no murmurs / rubs / gallops. No extremity edema. 2+ pedal pulses. No carotid bruits.  Abdomen: TTP LLQ Musculoskeletal: no clubbing / cyanosis. No joint deformity upper and lower extremities. Good ROM, no contractures. Normal muscle tone.  Skin: no rashes, lesions, ulcers. No induration Neurologic: CN 2-12 grossly intact. Sensation intact, DTR normal. Strength 5/5 in all 4.  Psychiatric: Normal judgment and insight. Alert and oriented x 3. Normal mood.    Labs on Admission: I have personally reviewed following labs and imaging studies  CBC: No  results for input(s): WBC, NEUTROABS, HGB, HCT, MCV, PLT in the last 168 hours. Basic Metabolic Panel: No results for input(s): NA, K, CL, CO2, GLUCOSE, BUN, CREATININE, CALCIUM, MG, PHOS in the last 168 hours. GFR: CrCl cannot be calculated (No successful lab value found.). Liver Function Tests: No results for input(s): AST, ALT, ALKPHOS, BILITOT, PROT, ALBUMIN in the last 168 hours. No results for input(s): LIPASE, AMYLASE in the last 168 hours. No results for input(s): AMMONIA in the last 168 hours. Coagulation Profile: No results for input(s): INR, PROTIME in the last 168 hours. Cardiac Enzymes: No results for input(s): CKTOTAL, CKMB, CKMBINDEX, TROPONINI in the last 168 hours. BNP (last 3 results) No results for input(s): PROBNP in the last 8760 hours. HbA1C: No results for input(s): HGBA1C in the last 72 hours. CBG: Recent Labs  Lab 08/18/2018 2300  GLUCAP 132*   Lipid Profile: No results for input(s): CHOL, HDL, LDLCALC, TRIG, CHOLHDL, LDLDIRECT in the last 72 hours. Thyroid Function Tests: No results for input(s): TSH, T4TOTAL, FREET4, T3FREE, THYROIDAB in the last 72 hours. Anemia Panel: No results for input(s): VITAMINB12, FOLATE, FERRITIN, TIBC, IRON, RETICCTPCT in the last 72 hours. Urine analysis: No results found for: COLORURINE, APPEARANCEUR, LABSPEC, PHURINE, GLUCOSEU, HGBUR, BILIRUBINUR, KETONESUR, PROTEINUR, UROBILINOGEN, NITRITE, LEUKOCYTESUR  Radiological Exams on Admission: No results found.  EKG: Independently reviewed.  Assessment/Plan Principal Problem:   Diverticulitis of colon with perforation Active Problems:   COVID-19 virus infection   HTN (hypertension)   DM2 (diabetes mellitus, type 2) (HCC)   Neutropenia (HCC)   Thrombocytopenia ([place])    1. Diverticulitis of colon with perf - localized air but no abscess 1. Sips and ice chips only 2. IVF: NS at 125 3. Denies any blood in stool (in contrast to the EDP diagnosis note). 4. Repeat CBC, CMP  in AM 5. Zosyn per pharm for now 6. Call gen surg in AM, but doubt this will be immediately surgical 2. COVID-19 - 1. PCP test came back positive today 2. Also has PNA findings on CT chest for this 3. As well as splenomegaly which can be associated 4. Daily CBC / CMP 5. IVF as above 6. Given ? History of A.Fib, use of other ABx meds, etc, as well as low grade lung disease for the moment (only needing 2L O2), will hold off on empiric hydroxychloroquine for now. 3. Neutropenia and thrombocytopenia - 1. Repeat CBC in AM 2. Neutropenia known to be seen with COVID 3. Thrombocytopenia - COVID vs ITP? Was 160 in June of 2019 4. HTN - 1. Resume home amlodipine 2. Will continue to hold home HCTZ and ARB for the moment (since he hasnt been taking these in several days anyhow). 5. DM2 -  1. Hold metformin 2. Sensitive scale SSI Q4H  DVT prophylaxis: SCDs due to thrombocytopenia Code Status: Full Family Communication: No family in room Disposition Plan: Home after admit Consults called: None Admission status:  Admit to inpatient  Severity of Illness: The appropriate patient status for this patient is INPATIENT. Inpatient status is judged to be reasonable and necessary in order to provide the required intensity of service to ensure the patient's safety. The patient's presenting symptoms, physical exam findings, and initial radiographic and laboratory data in the context of their chronic comorbidities is felt to place them at high risk for further clinical deterioration. Furthermore, it is not anticipated that the patient will be medically stable for discharge from the hospital within 2 midnights of admission. The following factors support the patient status of inpatient.   IP status for: 1) diverticulitis complicated by perforation 2) COVID-19 with new O2 requirement   * I certify that at the point of admission it is my clinical judgment that the patient will require inpatient hospital care  spanning beyond 2 midnights from the point of admission due to high intensity of service, high risk for further deterioration and high frequency of surveillance required.*    ,  M. DO [facility]  How to contact the [facility] Attending or Consulting provider [place] or covering provider during after hours [place], for this patient?  1. Check the care team in [facility] and look for a) attending/consulting [facility] provider listed and b) the [facility] team listed 2. Log into [email or web address]  Amion Physician Scheduling and messaging for groups and whole hospitals  On call and physician scheduling software for group practices, residents, hospitalists and other medical providers for call, clinic, rotation and shift schedules. OnCall [employer] is a hospital-wide system for scheduling doctors and paging doctors on call. EasyPlot is for scientific plotting and data analysis.  [email or web address]  and use [place]'s universal password to access. If you do not have the password, please contact the hospital operator.  3. Locate the [facility] provider you are looking for under [facility] and page to a number that you can be directly reached. 4. If you still have difficulty reaching the provider, please page the [facility] (Director on Call) for the Hospitalists listed on amion for assistance.  08/12/2018, 11:33 PM

## 2018-07-30 ENCOUNTER — Inpatient Hospital Stay (HOSPITAL_COMMUNITY): Payer: BLUE CROSS/BLUE SHIELD

## 2018-07-30 ENCOUNTER — Inpatient Hospital Stay: Payer: Self-pay

## 2018-07-30 ENCOUNTER — Encounter (HOSPITAL_COMMUNITY): Payer: Self-pay

## 2018-07-30 DIAGNOSIS — D696 Thrombocytopenia, unspecified: Secondary | ICD-10-CM | POA: Diagnosis present

## 2018-07-30 DIAGNOSIS — J069 Acute upper respiratory infection, unspecified: Secondary | ICD-10-CM

## 2018-07-30 DIAGNOSIS — J80 Acute respiratory distress syndrome: Secondary | ICD-10-CM

## 2018-07-30 DIAGNOSIS — D709 Neutropenia, unspecified: Secondary | ICD-10-CM | POA: Diagnosis present

## 2018-07-30 LAB — BLOOD GAS, ARTERIAL
Acid-base deficit: 4.9 mmol/L — ABNORMAL HIGH (ref 0.0–2.0)
Acid-base deficit: 4.3 mmol/L — ABNORMAL HIGH (ref 0.0–2.0)
Bicarbonate: 24.4 mmol/L (ref 20.0–28.0)
Bicarbonate: 25.1 mmol/L (ref 20.0–28.0)
Drawn by: 295031
Drawn by: 308601
FIO2: 100
FIO2: 100
MECHVT: 440 mL
MECHVT: 440 mL
O2 Saturation: 91.7 %
O2 Saturation: 98.7 %
PEEP: 15 cmH2O
PEEP: 15 cmH2O
Patient temperature: 98.6
Patient temperature: 97.8
RATE: 30 resp/min
RATE: 30 resp/min
pCO2 arterial: 62.6 mmHg — ABNORMAL HIGH (ref 32.0–48.0)
pCO2 arterial: 62.8 mmHg — ABNORMAL HIGH (ref 32.0–48.0)
pH, Arterial: 7.215 — ABNORMAL LOW (ref 7.350–7.450)
pH, Arterial: 7.223 — ABNORMAL LOW (ref 7.350–7.450)
pO2, Arterial: 77.1 mmHg — ABNORMAL LOW (ref 83.0–108.0)
pO2, Arterial: 178 mmHg — ABNORMAL HIGH (ref 83.0–108.0)

## 2018-07-30 LAB — HEPATIC FUNCTION PANEL
ALT: 34 U/L (ref 0–44)
AST: 60 U/L — ABNORMAL HIGH (ref 15–41)
Albumin: 2.9 g/dL — ABNORMAL LOW (ref 3.5–5.0)
Alkaline Phosphatase: 57 U/L (ref 38–126)
Bilirubin, Direct: 0.6 mg/dL — ABNORMAL HIGH (ref 0.0–0.2)
Indirect Bilirubin: 0.6 mg/dL (ref 0.3–0.9)
Total Bilirubin: 1.2 mg/dL (ref 0.3–1.2)
Total Protein: 6.3 g/dL — ABNORMAL LOW (ref 6.5–8.1)

## 2018-07-30 LAB — GLUCOSE, CAPILLARY
Glucose-Capillary: 125 mg/dL — ABNORMAL HIGH (ref 70–99)
Glucose-Capillary: 96 mg/dL (ref 70–99)
Glucose-Capillary: 105 mg/dL — ABNORMAL HIGH (ref 70–99)
Glucose-Capillary: 149 mg/dL — ABNORMAL HIGH (ref 70–99)
Glucose-Capillary: 147 mg/dL — ABNORMAL HIGH (ref 70–99)

## 2018-07-30 LAB — COMPREHENSIVE METABOLIC PANEL
ALT: 36 U/L (ref 0–44)
AST: 61 U/L — ABNORMAL HIGH (ref 15–41)
Albumin: 3.2 g/dL — ABNORMAL LOW (ref 3.5–5.0)
Alkaline Phosphatase: 56 U/L (ref 38–126)
Anion gap: 12 (ref 5–15)
BUN: 18 mg/dL (ref 6–20)
CO2: 23 mmol/L (ref 22–32)
Calcium: 8.1 mg/dL — ABNORMAL LOW (ref 8.9–10.3)
Chloride: 102 mmol/L (ref 98–111)
Creatinine, Ser: 1.22 mg/dL (ref 0.61–1.24)
GFR calc Af Amer: >60 mL/min (ref >60)
GFR calc non Af Amer: >60 mL/min (ref >60)
Glucose, Bld: 124 mg/dL — ABNORMAL HIGH (ref 70–99)
Potassium: 3.2 mmol/L — ABNORMAL LOW (ref 3.5–5.1)
Sodium: 137 mmol/L (ref 135–145)
Total Bilirubin: 1.2 mg/dL (ref 0.3–1.2)
Total Protein: 6.8 g/dL (ref 6.5–8.1)

## 2018-07-30 LAB — CBC
HCT: 51 % (ref 39.0–52.0)
Hemoglobin: 16.5 g/dL (ref 13.0–17.0)
MCH: 28.6 pg (ref 26.0–34.0)
MCHC: 32.4 g/dL (ref 30.0–36.0)
MCV: 88.5 fL (ref 80.0–100.0)
Platelets: 70 10*3/uL — ABNORMAL LOW (ref 150–400)
RBC: 5.76 MIL/uL (ref 4.22–5.81)
RDW: 15.6 % — ABNORMAL HIGH (ref 11.5–15.5)
WBC: 2.5 10*3/uL — ABNORMAL LOW (ref 4.0–10.5)
nRBC: 0 % (ref 0.0–0.2)

## 2018-07-30 LAB — MAGNESIUM: Magnesium: 2.1 mg/dL (ref 1.7–2.4)

## 2018-07-30 LAB — TRIGLYCERIDES: Triglycerides: 363 mg/dL — ABNORMAL HIGH (ref <150)

## 2018-07-30 LAB — PHOSPHORUS: Phosphorus: 5.6 mg/dL — ABNORMAL HIGH (ref 2.5–4.6)

## 2018-07-30 LAB — MRSA PCR SCREENING: MRSA by PCR: NEGATIVE

## 2018-07-30 MED ORDER — VECURONIUM BOLUS VIA INFUSION
20.0000 mg | Freq: Once | INTRAVENOUS | Status: AC
  Administered 2018-07-30: 20 mg via INTRAVENOUS

## 2018-07-30 MED ORDER — POTASSIUM CHLORIDE 20 MEQ/15ML (10%) PO SOLN
40.0000 meq | Freq: Once | ORAL | Status: AC
  Administered 2018-07-30: 17:00:00 40 meq via ORAL
  Filled 2018-07-30: qty 30

## 2018-07-30 MED ORDER — FENTANYL BOLUS VIA INFUSION
50.0000 ug | INTRAVENOUS | Status: DC | PRN
  Filled 2018-07-30: qty 50

## 2018-07-30 MED ORDER — AZITHROMYCIN 200 MG/5ML PO SUSR
500.0000 mg | Freq: Every day | ORAL | Status: AC
  Administered 2018-07-30 – 2018-08-03 (×5): 500 mg via ORAL
  Filled 2018-07-30 (×6): qty 12.5

## 2018-07-30 MED ORDER — HYDROXYCHLOROQUINE SULFATE 200 MG PO TABS: 200.0000 mg | ORAL_TABLET | Freq: Two times a day (BID) | ORAL | Status: DC

## 2018-07-30 MED ORDER — PANTOPRAZOLE SODIUM 40 MG IV SOLR
40.0000 mg | INTRAVENOUS | Status: DC
  Administered 2018-07-30 – 2018-08-21 (×23): 40 mg via INTRAVENOUS
  Filled 2018-07-30 (×23): qty 40

## 2018-07-30 MED ORDER — FENTANYL CITRATE (PF) 100 MCG/2ML IJ SOLN: 100.0000 ug | INTRAMUSCULAR | Status: DC | PRN

## 2018-07-30 MED ORDER — ONDANSETRON HCL 4 MG/2ML IJ SOLN: 4.0000 mg | Freq: Four times a day (QID) | INTRAMUSCULAR | Status: DC | PRN

## 2018-07-30 MED ORDER — SODIUM CHLORIDE 0.9% FLUSH
10.0000 mL | INTRAVENOUS | Status: DC | PRN
  Administered 2018-08-03: 20 mL
  Administered 2018-08-04 (×2): 10 mL
  Administered 2018-08-08: 20 mL
  Filled 2018-07-30 (×4): qty 40

## 2018-07-30 MED ORDER — HYDROXYCHLOROQUINE SULFATE 200 MG PO TABS
200.0000 mg | ORAL_TABLET | Freq: Two times a day (BID) | ORAL | Status: AC
  Administered 2018-07-30 – 2018-08-03 (×8): 200 mg
  Filled 2018-07-30 (×8): qty 1

## 2018-07-30 MED ORDER — FENTANYL 2500MCG IN NS 250ML (10MCG/ML) PREMIX INFUSION
INTRAVENOUS | Status: AC
  Filled 2018-07-30: qty 250

## 2018-07-30 MED ORDER — PROPOFOL 1000 MG/100ML IV EMUL
0.0000 ug/kg/min | INTRAVENOUS | Status: AC
  Administered 2018-07-30: 13:00:00 5 ug/kg/min via INTRAVENOUS
  Administered 2018-07-31 (×3): 30 ug/kg/min via INTRAVENOUS
  Administered 2018-08-01: 06:00:00 25 ug/kg/min via INTRAVENOUS
  Administered 2018-08-02: 22:00:00 30 ug/kg/min via INTRAVENOUS
  Administered 2018-08-02: 18:00:00 25 ug/kg/min via INTRAVENOUS
  Administered 2018-08-02 – 2018-08-03 (×4): 30 ug/kg/min via INTRAVENOUS
  Filled 2018-07-30 (×3): qty 100
  Filled 2018-07-30: qty 200
  Filled 2018-07-30 (×2): qty 100
  Filled 2018-07-30: qty 200
  Filled 2018-07-30 (×2): qty 100
  Filled 2018-07-30: qty 200
  Filled 2018-07-30 (×13): qty 100

## 2018-07-30 MED ORDER — MIDAZOLAM HCL 2 MG/2ML IJ SOLN
2.0000 mg | INTRAMUSCULAR | Status: DC | PRN
  Administered 2018-07-30: 21:00:00 2 mg via INTRAVENOUS
  Filled 2018-07-30 (×2): qty 2

## 2018-07-30 MED ORDER — PIPERACILLIN-TAZOBACTAM 3.375 G IVPB 30 MIN
3.3750 g | INTRAVENOUS | Status: AC
  Administered 2018-07-30: 03:00:00 3.375 g via INTRAVENOUS
  Filled 2018-07-30 (×2): qty 50

## 2018-07-30 MED ORDER — ARTIFICIAL TEARS OPHTHALMIC OINT
1.0000 "application " | TOPICAL_OINTMENT | Freq: Three times a day (TID) | OPHTHALMIC | Status: DC
  Administered 2018-07-31 – 2018-08-03 (×11): 1 via OPHTHALMIC
  Filled 2018-07-30 (×2): qty 3.5

## 2018-07-30 MED ORDER — HYDROXYCHLOROQUINE SULFATE 200 MG PO TABS: 400.0000 mg | ORAL_TABLET | Freq: Two times a day (BID) | ORAL | Status: DC

## 2018-07-30 MED ORDER — FENTANYL CITRATE (PF) 100 MCG/2ML IJ SOLN
INTRAMUSCULAR | Status: AC
  Administered 2018-07-30: 14:00:00
  Filled 2018-07-30: qty 4

## 2018-07-30 MED ORDER — LIP MEDEX EX OINT: 1.0000 "application " | TOPICAL_OINTMENT | CUTANEOUS | Status: DC | PRN

## 2018-07-30 MED ORDER — HEPARIN SODIUM (PORCINE) 5000 UNIT/ML IJ SOLN: 5000.0000 [IU] | Freq: Three times a day (TID) | INTRAMUSCULAR | Status: DC

## 2018-07-30 MED ORDER — ENOXAPARIN SODIUM 40 MG/0.4ML ~~LOC~~ SOLN
40.0000 mg | Freq: Every day | SUBCUTANEOUS | Status: DC
  Administered 2018-07-30 – 2018-08-07 (×9): 40 mg via SUBCUTANEOUS
  Filled 2018-07-30 (×8): qty 0.4

## 2018-07-30 MED ORDER — CHLORHEXIDINE GLUCONATE CLOTH 2 % EX PADS
6.0000 | MEDICATED_PAD | Freq: Every day | CUTANEOUS | Status: DC
  Administered 2018-07-30 – 2018-08-08 (×10): 6 via TOPICAL

## 2018-07-30 MED ORDER — AMLODIPINE BESYLATE 10 MG PO TABS
10.0000 mg | ORAL_TABLET | Freq: Every day | ORAL | Status: DC
  Administered 2018-07-30: 10:00:00 10 mg via ORAL
  Filled 2018-07-30: qty 1

## 2018-07-30 MED ORDER — HYDRALAZINE HCL 20 MG/ML IJ SOLN
10.0000 mg | Freq: Four times a day (QID) | INTRAMUSCULAR | Status: DC | PRN
  Administered 2018-07-30 – 2018-08-12 (×3): 10 mg via INTRAVENOUS
  Filled 2018-07-30 (×6): qty 1

## 2018-07-30 MED ORDER — AZITHROMYCIN 250 MG PO TABS: 500.0000 mg | ORAL_TABLET | Freq: Every day | ORAL | Status: DC

## 2018-07-30 MED ORDER — AMLODIPINE BESYLATE 10 MG PO TABS
10.0000 mg | ORAL_TABLET | Freq: Every day | ORAL | Status: DC
  Administered 2018-07-30 – 2018-08-05 (×3): 10 mg
  Filled 2018-07-30 (×7): qty 1

## 2018-07-30 MED ORDER — CHLORHEXIDINE GLUCONATE CLOTH 2 % EX PADS
6.0000 | MEDICATED_PAD | Freq: Every day | CUTANEOUS | Status: DC
  Administered 2018-07-30 – 2018-08-12 (×14): 6 via TOPICAL

## 2018-07-30 MED ORDER — SODIUM CHLORIDE 0.9% FLUSH
10.0000 mL | Freq: Two times a day (BID) | INTRAVENOUS | Status: DC
  Administered 2018-07-30 – 2018-07-31 (×3): 10 mL
  Administered 2018-08-01: 30 mL
  Administered 2018-08-01 – 2018-08-03 (×5): 10 mL
  Administered 2018-08-04: 11:00:00 30 mL
  Administered 2018-08-04 – 2018-08-06 (×4): 10 mL
  Administered 2018-08-07: 40 mL
  Administered 2018-08-07: 09:00:00 10 mL
  Administered 2018-08-08: 40 mL
  Administered 2018-08-08: 10 mL
  Administered 2018-08-09: 10:00:00 20 mL
  Administered 2018-08-10 – 2018-08-11 (×3): 10 mL
  Administered 2018-08-12: 20 mL
  Administered 2018-08-12 – 2018-08-22 (×13): 10 mL

## 2018-07-30 MED ORDER — FENTANYL CITRATE (PF) 100 MCG/2ML IJ SOLN: 100.0000 ug | Freq: Once | INTRAMUSCULAR | Status: DC | PRN

## 2018-07-30 MED ORDER — PANTOPRAZOLE SODIUM 40 MG PO PACK
40.0000 mg | PACK | Freq: Every day | ORAL | Status: DC
  Filled 2018-07-30: qty 20

## 2018-07-30 MED ORDER — PIPERACILLIN-TAZOBACTAM 3.375 G IVPB
3.3750 g | Freq: Three times a day (TID) | INTRAVENOUS | Status: DC
  Administered 2018-07-30 – 2018-08-02 (×10): 3.375 g via INTRAVENOUS
  Filled 2018-07-30 (×10): qty 50

## 2018-07-30 MED ORDER — ORAL CARE MOUTH RINSE
15.0000 mL | Freq: Two times a day (BID) | OROMUCOSAL | Status: DC
  Administered 2018-07-30: 09:00:00 15 mL via OROMUCOSAL

## 2018-07-30 MED ORDER — PANTOPRAZOLE SODIUM 40 MG PO TBEC: 40.0000 mg | DELAYED_RELEASE_TABLET | Freq: Every day | ORAL | Status: DC

## 2018-07-30 MED ORDER — VECURONIUM BROMIDE 10 MG IV SOLR
20.0000 mg | Freq: Once | INTRAVENOUS | Status: AC
  Administered 2018-07-30: 20 mg via INTRAVENOUS

## 2018-07-30 MED ORDER — FENTANYL CITRATE (PF) 100 MCG/2ML IJ SOLN
100.0000 ug | INTRAMUSCULAR | Status: DC | PRN
  Administered 2018-07-30: 100 ug via INTRAVENOUS
  Filled 2018-07-30: qty 2

## 2018-07-30 MED ORDER — VECURONIUM BROMIDE 10 MG IV SOLR: 0.8000 ug/kg/min | INTRAVENOUS | Status: DC

## 2018-07-30 MED ORDER — VITAL HIGH PROTEIN PO LIQD
1000.0000 mL | ORAL | Status: DC
  Administered 2018-07-30: 17:00:00 1000 mL

## 2018-07-30 MED ORDER — FENTANYL CITRATE (PF) 100 MCG/2ML IJ SOLN
100.0000 ug | Freq: Once | INTRAMUSCULAR | Status: AC
  Administered 2018-07-30: 100 ug via INTRAVENOUS

## 2018-07-30 MED ORDER — VECURONIUM BROMIDE 10 MG IV SOLR: 40.0000 mg | Freq: Once | INTRAVENOUS | Status: DC

## 2018-07-30 MED ORDER — ONDANSETRON HCL 4 MG PO TABS: 4.0000 mg | ORAL_TABLET | Freq: Four times a day (QID) | ORAL | Status: DC | PRN

## 2018-07-30 MED ORDER — HYDROXYCHLOROQUINE SULFATE 200 MG PO TABS
400.0000 mg | ORAL_TABLET | Freq: Two times a day (BID) | ORAL | Status: AC
  Administered 2018-07-30: 21:00:00 400 mg
  Filled 2018-07-30 (×2): qty 2

## 2018-07-30 MED ORDER — SODIUM CHLORIDE 0.9 % IV SOLN
INTRAVENOUS | Status: DC | PRN
  Administered 2018-08-08 – 2018-08-09 (×2): via INTRA_ARTERIAL

## 2018-07-30 MED ORDER — PROMETHAZINE HCL 25 MG/ML IJ SOLN
12.5000 mg | Freq: Once | INTRAMUSCULAR | Status: AC
  Administered 2018-07-30: 12.5 mg via INTRAVENOUS
  Filled 2018-07-30: qty 1

## 2018-07-30 MED ORDER — FENTANYL 2500MCG IN NS 250ML (10MCG/ML) PREMIX INFUSION
25.0000 ug/h | INTRAVENOUS | Status: DC
  Administered 2018-07-30: 25 ug/h via INTRAVENOUS
  Administered 2018-07-31 – 2018-08-01 (×3): 400 ug/h via INTRAVENOUS
  Filled 2018-07-30 (×10): qty 250

## 2018-07-30 MED ORDER — VECURONIUM BROMIDE 10 MG IV SOLR
0.8000 ug/kg/min | INTRAVENOUS | Status: DC
  Administered 2018-07-30: 1 ug/kg/min via INTRAVENOUS
  Administered 2018-07-31: 0.503 ug/kg/min via INTRAVENOUS
  Filled 2018-07-30 (×4): qty 100

## 2018-07-30 MED ORDER — LIP MEDEX EX OINT
TOPICAL_OINTMENT | CUTANEOUS | Status: AC
  Administered 2018-07-30: 10:00:00
  Filled 2018-07-30: qty 7

## 2018-07-30 MED ORDER — PRO-STAT SUGAR FREE PO LIQD
30.0000 mL | Freq: Two times a day (BID) | ORAL | Status: DC
  Filled 2018-07-30 (×2): qty 30

## 2018-07-30 MED ORDER — FENTANYL CITRATE (PF) 100 MCG/2ML IJ SOLN
50.0000 ug | Freq: Once | INTRAMUSCULAR | Status: AC
  Administered 2018-07-30: 50 ug via INTRAVENOUS

## 2018-07-30 MED ORDER — ACETAMINOPHEN 160 MG/5ML PO SOLN
650.0000 mg | Freq: Four times a day (QID) | ORAL | Status: DC | PRN
  Administered 2018-07-30 – 2018-08-08 (×3): 650 mg via ORAL
  Filled 2018-07-30 (×6): qty 20.3

## 2018-07-30 MED ORDER — MIDAZOLAM HCL 2 MG/2ML IJ SOLN
INTRAMUSCULAR | Status: AC
  Filled 2018-07-30: qty 4

## 2018-07-30 MED ORDER — MIDAZOLAM HCL 2 MG/2ML IJ SOLN
2.0000 mg | INTRAMUSCULAR | Status: AC | PRN
  Administered 2018-07-30 (×3): 2 mg via INTRAVENOUS
  Filled 2018-07-30 (×2): qty 2

## 2018-07-30 NOTE — Progress Notes (Signed)
Pharmacy Antibiotic Note  [name] Long is a 53 y.o. male admitted on 08/09/2018 with intra-abdominal infection.  Pharmacy has been consulted for zosyn dosing. COVID-19 positive (tested outpt)  Plan: Zosyn 3.375g IV q8h (4 hour infusion).  F/u scr/cultures  Height: 5\' 10"  (177.8 cm) Weight: 294 lb 15.6 oz (133.8 kg) IBW/kg (Calculated) : 73  Temp (24hrs), Avg:100.1 F (37.8 C), Min:100.1 F (37.8 C), Max:100.1 F (37.8 C)  No results for input(s): WBC, CREATININE, LATICACIDVEN, VANCOTROUGH, VANCOPEAK, VANCORANDOM, GENTTROUGH, GENTPEAK, GENTRANDOM, TOBRATROUGH, TOBRAPEAK, TOBRARND, AMIKACINPEAK, AMIKACINTROU, AMIKACIN in the last 168 hours.  CrCl cannot be calculated (No successful lab value found.).    No Known Allergies  Antimicrobials this admission: 4/2 zosyn >>    >>   Dose adjustments this admission:   Microbiology results:  BCx:   UCx:    Sputum:    MRSA PCR:   Thank you for allowing pharmacy to be a part of this patient's care.  Dorrene German 07/30/2018 12:48 AM

## 2018-07-30 NOTE — Progress Notes (Signed)
MD Loanne Drilling ordered for patient to receive initial 20mg  bolus of IV vecuronium to prone patient. Patient was a difficult intubation earlier and was still moving after when rocuronium was given. Patient was asynchronous with ventilator early during the day and vecuronium 20mg  was used. During patient prone procedure, patient required additional 20mg  of vecuronium. Patient is in ARDS, and positive for Covid-19.

## 2018-07-30 NOTE — Progress Notes (Signed)
NAME:  [name] Long, MRN:  [id number], DOB:  Oct 30, 1965, LOS: 1 ADMISSION DATE:  08/02/2018, CONSULTATION DATE:  07/30/2018 REFERRING MD:  07/30/2018, CHIEF COMPLAINT:   Brief History   53 y/o male admitted on 4/1 with abdominal pain which had been persistent for several days.  COVID POSITIVE.  PCCM consulted 4/2 for worsening hypoxemia.    He was noted to be COVID positive by a PCP check earlier in the week.  He went to [facility] with abdominal pain, noted to have diverticulitis, was trasnferred to [facility] for further evaluation.    History of present illness   53 y/o male admitted on 4/1 with abdominal pain which had been persistent for several days.  COVID POSITIVE.  PCCM consulted 4/2 for worsening hypoxemia.    He was noted to be COVID positive by a PCP check earlier in the week.  He went to [facility] with abdominal pain, noted to have diverticulitis, was trasnferred to [facility] for further evaluation.  Overnight he has developed worsening hypoxemia and dyspnea.    His wife is a Marine scientist and noted symptoms for several days.   Past Medical History  DM2, HTN, Former smoker, OSA  Significant Hospital Events   4/1 admission for diverticulitis 4/2 PCCM consulted for worsening dyspnea, hypoxemia  Consults:  4/2 PCCM  Procedures:  4/2 ETT >   Significant Diagnostic Tests:    Micro Data:  3/30 COVID 19 test positive (outside PCP)  Antimicrobials:  4/1 zosyn >   Interim history/subjective:  As above  Objective   Blood pressure (!) 167/93, pulse (!) 112, temperature 99.8 F (37.7 C), temperature source Oral, resp. rate 19, height 5\' 10"  (1.778 m), weight 132.5 kg, SpO2 90 %.        Intake/Output Summary (Last 24 hours) at 07/30/2018 1156 Last data filed at 07/30/2018 1151 Gross per 24 hour  Intake 346.17 ml  Output 525 ml  Net -178.83 ml   Filed Weights   08/20/2018 2147 07/30/18 0226  Weight: 133.8 kg 132.5 kg    Examination:  General:  Resting comfortably in bed  HENT: NCAT OP clear PULM: CTA B, normal effort CV: RRR, no mgr GI: BS+, soft, nontender MSK: normal bulk and tone Neuro: awake, alert, no distress, MAEW   Resolved Hospital Problem list     Assessment & Plan:  Diverticulitis: > continue zosyn  Acute respiratory failure with hypoxemia: rapidly progressive overnight > continue full vent support > VAP prevention > PAD protocol > ARDS protocol for mechanical ventilation  SARS COV Pneumonia > start plaquenil > start azithromycin > can't do tocilizumab given  > not at risk for TB given occupation (discussed with wife)  DM2 > SSI q4  OSA > intubate   Best practice:  Diet: start tube feeding Pain/Anxiety/Delirium protocol (if indicated): yes, RASS -2 VAP protocol (if indicated): yes DVT prophylaxis: start sub q heparin GI prophylaxis: pantoprazole Glucose control: SSI Mobility: bed rest Code Status: full Family Communication: updated wife by phone Disposition:   Labs   CBC: Recent Labs  Lab 07/30/18 0247  WBC 2.5*  HGB 16.5  HCT 51.0  MCV 88.5  PLT 70*    Basic Metabolic Panel: Recent Labs  Lab 07/30/18 0247  NA 137  K 3.2*  CL 102  CO2 23  GLUCOSE 124*  BUN 18  CREATININE 1.22  CALCIUM 8.1*   GFR: Estimated Creatinine Clearance: 97 mL/min (by C-G formula based on SCr of 1.22 mg/dL). Recent Labs  Lab 07/30/18  0247  WBC 2.5*    Liver Function Tests: Recent Labs  Lab 07/30/18 0247  AST 61*  ALT 36  ALKPHOS 56  BILITOT 1.2  PROT 6.8  ALBUMIN 3.2*   No results for input(s): LIPASE, AMYLASE in the last 168 hours. No results for input(s): AMMONIA in the last 168 hours.  ABG No results found for: PHART, PCO2ART, PO2ART, HCO3, TCO2, ACIDBASEDEF, O2SAT   Coagulation Profile: No results for input(s): INR, PROTIME in the last 168 hours.  Cardiac Enzymes: No results for input(s): CKTOTAL, CKMB, CKMBINDEX, TROPONINI in the last 168 hours.  HbA1C: No results found for: HGBA1C  CBG:  Recent Labs  Lab 07/30/2018 2300 07/30/18 0342 07/30/18 0815  GLUCAP 132* 125* 96    Review of Systems:   Cannot obtain due to dyspnea  Past Medical History  He,  has a past medical history of A-fib ([place]), DM2 (diabetes mellitus, type 2) ([place]), HLD (hyperlipidemia), HTN (hypertension), and OSA (obstructive sleep apnea).   Surgical History    Past Surgical History:  Procedure Laterality Date  . APPENDECTOMY  age 10  . PALATE / UVULA BIOPSY / EXCISION  2009  . TONSILLECTOMY AND ADENOIDECTOMY  age 50  . TOTAL KNEE ARTHROPLASTY  1996 / 2008   bilateral  . Thermal History   reports that he quit smoking about 9 years ago. His smoking use included cigarettes. He has never used smokeless tobacco. He reports current alcohol use. He reports that he does not use drugs.   Family History   His family history includes Cancer in his mother; Heart disease in his father, mother, and another family member; Hypertension in his mother.   Allergies No Known Allergies   Home Medications  Prior to Admission medications   Medication Sig Start Date End Date Taking? Authorizing Provider  aspirin 81 MG tablet Take 81 mg by mouth daily.     Yes [provider]  atorvastatin (LIPITOR) 40 MG tablet Take 40 mg by mouth daily. 10/24/17  Yes [provider]  buPROPion (WELLBUTRIN SR) 200 MG 12 hr tablet Take 200 mg by mouth 2 (two) times daily.   Yes [provider]  co-enzyme Q-10 30 MG capsule Take 30 mg by mouth daily.   Yes [provider]  empagliflozin (JARDIANCE) 25 MG TABS tablet Take 25 mg by mouth daily.   Yes [provider]  Glucos-Chond-Hyal Ac-Ca Fructo (MOVE FREE JOINT HEALTH ADVANCE PO) Take 1 tablet by mouth daily.   Yes [provider]  metFORMIN (GLUCOPHAGE-XR) 500 MG 24 hr tablet Take 1,000 mg by mouth daily.   Yes [provider]  metoprolol tartrate (LOPRESSOR) 25 MG tablet Take 25 mg by mouth 2 (two)  times daily.   Yes [provider]  spironolactone (ALDACTONE) 25 MG tablet Take 25 mg by mouth daily. 11/22/15  Yes [provider]     Critical care time: 35 minutes    Roselie Awkward, MD [place] P[facility]M Pager: [phone] Cell: [phone] If no response, call [phone]

## 2018-07-30 NOTE — Assessment & Plan Note (Signed)
COVID positive

## 2018-07-30 NOTE — Progress Notes (Signed)
Called to bedside by charge RN. Worsening oxygenation. Increased PEEP with immediate improvement of sats to 88-89%. Given trial of vecuronium. Will continue to reassess to consider if prolonged course of paralytics indicated +/- proning.  Rodman Pickle, M.D. [facility] Pulmonary/Critical Care Medicine Pager: [phone] After hours pager: [phone]

## 2018-07-30 NOTE — Progress Notes (Signed)
Cpap not placed on pt due to positive COVID19.

## 2018-07-30 NOTE — Progress Notes (Signed)
O2 requirements have increased from 7L HFNC to 10L HFNC this shift. O2 Sats now 91-92% 10L HFNC. MD Broadus John and pulmonology NP aware. Pt able to speak in sentences and appears comfortable despite O2 requirements. Pt says SOB is increasing some, but denies chest tightness. BP also very high- MD aware. Stated she will place new orders. Will continue to monitor closely and follow all new orders.

## 2018-07-30 NOTE — Procedures (Signed)
Intubation Procedure Note Nikalas Bramel [id number] June 24, 1965  Procedure: Intubation Indications: Respiratory insufficiency  Procedure Details Consent: Risks of procedure as well as the alternatives and risks of each were explained to the (patient/caregiver).  Consent for procedure obtained. Time Out: Verified patient identification, verified procedure, site/side was marked, verified correct patient position, special equipment/implants available, medications/allergies/relevent history reviewed, required imaging and test results available.  Performed  Drugs Etomidate 20mg  IV, versed 2mg  IV, fentanyl 46mcg IV Rocuronium 100mg  IV DL x 1 with GS4 blade Grade 1 view 7.5 ET tube passed through cords under direct visualization Placement confirmed with bilateral breath sounds, positive EtCO2 change and smoke in tube   Evaluation Hemodynamic Status: BP stable throughout; O2 sats: stable throughout and transiently fell during during procedure Patient's Current Condition: stable Complications: No apparent complications Patient did tolerate procedure well. Chest X-ray ordered to verify placement.  CXR: pending.   Simonne Maffucci 07/30/2018

## 2018-07-30 NOTE — Progress Notes (Signed)
Peripherally Inserted Central Catheter/Midline Placement  The IV Nurse has discussed with the patient and/or persons authorized to consent for the patient, the purpose of this procedure and the potential benefits and risks involved with this procedure.  The benefits include less needle sticks, lab draws from the catheter, and the patient may be discharged home with the catheter. Risks include, but not limited to, infection, bleeding, blood clot (thrombus formation), and puncture of an artery; nerve damage and irregular heartbeat and possibility to perform a PICC exchange if needed/ordered by physician.  Alternatives to this procedure were also discussed.  Bard Power PICC patient education guide, fact sheet on infection prevention and patient information card has been provided to patient /or left at bedside.    PICC/Midline Placement Documentation  PICC Triple Lumen 79/15/05 PICC Basilic 45 cm (Active)  Indication for Insertion or Continuance of Line Prolonged intravenous therapies 07/30/2018  5:00 PM  Site Assessment Clean;Dry;Intact 07/30/2018  5:00 PM  Lumen #1 Status Flushed;Blood return noted 07/30/2018  5:00 PM  Lumen #2 Status Flushed;Blood return noted 07/30/2018  5:00 PM  Lumen #3 Status Flushed;Blood return noted 07/30/2018  5:00 PM  Dressing Type Transparent 07/30/2018  5:00 PM  Dressing Status Clean;Dry;Intact;Antimicrobial disc in place 07/30/2018  5:00 PM  Dressing Change Due 08/06/18 07/30/2018  5:00 PM       Jule Economy Horton 07/30/2018, 5:10 PM

## 2018-07-30 NOTE — Progress Notes (Signed)
PROGRESS NOTE    [name] Long  ZOX:[id number] DOB: Jan 05, 1966 DOA: 08/14/2018 PCP: Patient, No Pcp Per  Brief Narrative: 53 year old male history of type 2 diabetes mellitus, hypertension, OSA on C Pap, was tested for covid on Monday through his PCP's office, due to recurrent fevers, in addition on Sundays for having lower abdominal pain associated with nausea, dry heaves and some diarrhea. -He presented to [facility] emergency room yesterday had a CTA chest performed which was consistent with viral pneumonia, also had a CT abdomen pelvis performed which noted perforated diverticulitis with localized extraluminal air and no abscess He was noted to have mild hypoxemia on admission last eye which unfortunately has worsened  Assessment & Plan:   Covid 19 with acute hypoxic respiratory failure -currently on 10 L high flow oxygen with O2 sats around 90%, if worsens on this will transition to nonrebreather mask and then to mechanical ventilation -CT chest from outside hospital noted viral pneumonia last night -[facility] M consulted this am -patient has a positive Covid 19 PCR results from Monday prior to admission -discussed CODE STATUS with the patient again he wishes to be full code  Acute diverticulitis with perforation,  -CT from [facility] noted localized extraluminal air, no abscess -Discussed with general surgery Dr.Newman, recommendations for bowel rest for few days, agreed with IV Zosyn, will need repeat imaging down the road. -Recommended to touch base with general surgery before initiating enteral feeds -if he worsens with enlarging abscess may need a percutaneous drain  Neutropenia/thrombocytopenia -Likely secondary to code and this sepsis from diverticulitis is likely contributing to worsening thrombocytopenia  Diabetes mellitus -Hold metformin, sliding scale insulin every 4 hours for now  Obesity  OSA -Avoid C Pap due to risk of aerosolization  DVT prophylaxis: SCDs due to  thrombocytopenia Code Status: full code Family Communication:no family at bedside Disposition Plan: remain in ICU  Consultants:   PCCM  Discussed with general surgery Dr. Lucia Gaskins   Procedures:   Antimicrobials:    Subjective: -mild lower abdominal discomfort and nausea -Also reports dry cough, slight worsening shortness of breath compared to last night  Objective: Vitals:   07/30/18 0800 07/30/18 0842 07/30/18 0900 07/30/18 1020  BP: (!) 165/101  (!) 193/79   Pulse: 98  (!) 54   Resp: 19  (!) 31 (!) 24  Temp:    (!) 100.9 F (38.3 C)  TempSrc:    Oral  SpO2: 91% 92% 96% 91%  Weight:      Height:        Intake/Output Summary (Last 24 hours) at 07/30/2018 1114 Last data filed at 07/30/2018 0654 Gross per 24 hour  Intake 346.17 ml  Output 225 ml  Net 121.17 ml   Filed Weights   08/25/2018 2147 07/30/18 0226  Weight: 133.8 kg 132.5 kg    Examination:  General exam: obese chronically ill appearing maleAppears calm and comfortable, no distress Respiratory system: good air movement, bronchial breath sounds throughout both lower lobes Cardiovascular system: S1 & S2 heard, RRR  Gastrointestinal system:Obese, soft, mildly distended, left lower quadrant is tender no rebound bowel sounds diminished Central nervous system: Alert and oriented. No focal neurological deficits. Extremities: no edema Skin: No rashes, lesions or ulcers Psychiatry: Judgement and insight appear normal. Mood & affect appropriate.     Data Reviewed:   CBC: Recent Labs  Lab 07/30/18 0247  WBC 2.5*  HGB 16.5  HCT 51.0  MCV 88.5  PLT 70*   Basic Metabolic Panel: Recent Labs  Lab 07/30/18 0247  NA 137  K 3.2*  CL 102  CO2 23  GLUCOSE 124*  BUN 18  CREATININE 1.22  CALCIUM 8.1*   GFR: Estimated Creatinine Clearance: 97 mL/min (by C-G formula based on SCr of 1.22 mg/dL). Liver Function Tests: Recent Labs  Lab 07/30/18 0247  AST 61*  ALT 36  ALKPHOS 56  BILITOT 1.2  PROT 6.8   ALBUMIN 3.2*   No results for input(s): LIPASE, AMYLASE in the last 168 hours. No results for input(s): AMMONIA in the last 168 hours. Coagulation Profile: No results for input(s): INR, PROTIME in the last 168 hours. Cardiac Enzymes: No results for input(s): CKTOTAL, CKMB, CKMBINDEX, TROPONINI in the last 168 hours. BNP (last 3 results) No results for input(s): PROBNP in the last 8760 hours. HbA1C: No results for input(s): HGBA1C in the last 72 hours. CBG: Recent Labs  Lab 08/19/2018 2300  GLUCAP 132*   Lipid Profile: No results for input(s): CHOL, HDL, LDLCALC, TRIG, CHOLHDL, LDLDIRECT in the last 72 hours. Thyroid Function Tests: No results for input(s): TSH, T4TOTAL, FREET4, T3FREE, THYROIDAB in the last 72 hours. Anemia Panel: No results for input(s): VITAMINB12, FOLATE, FERRITIN, TIBC, IRON, RETICCTPCT in the last 72 hours. Urine analysis: No results found for: COLORURINE, APPEARANCEUR, LABSPEC, PHURINE, GLUCOSEU, HGBUR, BILIRUBINUR, KETONESUR, PROTEINUR, UROBILINOGEN, NITRITE, LEUKOCYTESUR Sepsis Labs: @LABRCNTIP (procalcitonin:4,lacticidven:4)  ) Recent Results (from the past 240 hour(s))  MRSA PCR Screening     Status: None   Collection Time: 07/30/18  2:30 AM  Result Value Ref Range Status   MRSA by PCR NEGATIVE NEGATIVE Final    Comment:        The GeneXpert MRSA Assay (FDA approved for NASAL specimens only), is one component of a comprehensive MRSA colonization surveillance program. It is not intended to diagnose MRSA infection nor to guide or monitor treatment for MRSA infections. Performed at [facility], [place] [address] [place], [place] [address]          Radiology Studies: No results found.      Scheduled Meds: . amLODipine  10 mg Oral Daily  . insulin aspart  0-9 Units Subcutaneous Q4H  . mouth rinse  15 mL Mouth Rinse BID   Continuous Infusions: . sodium chloride 10 mL/hr at 07/30/18 0914  . piperacillin-tazobactam  (ZOSYN)  IV 3.375 g (07/30/18 0939)     LOS: 1 day    Time spent: 38min    Domenic Polite, MD [facility]  07/30/2018, 11:14 AM

## 2018-07-30 NOTE — Progress Notes (Signed)
Nocturnal CPAP discontinued due to positive results for novel coronavirus.

## 2018-07-31 LAB — GLUCOSE, CAPILLARY
Glucose-Capillary: 109 mg/dL — ABNORMAL HIGH (ref 70–99)
Glucose-Capillary: 125 mg/dL — ABNORMAL HIGH (ref 70–99)
Glucose-Capillary: 170 mg/dL — ABNORMAL HIGH (ref 70–99)
Glucose-Capillary: 181 mg/dL — ABNORMAL HIGH (ref 70–99)
Glucose-Capillary: 187 mg/dL — ABNORMAL HIGH (ref 70–99)
Glucose-Capillary: 58 mg/dL — ABNORMAL LOW (ref 70–99)

## 2018-07-31 LAB — BASIC METABOLIC PANEL
Anion gap: 12 (ref 5–15)
BUN: 31 mg/dL — ABNORMAL HIGH (ref 6–20)
CO2: 23 mmol/L (ref 22–32)
Calcium: 7.9 mg/dL — ABNORMAL LOW (ref 8.9–10.3)
Chloride: 104 mmol/L (ref 98–111)
Creatinine, Ser: 2.27 mg/dL — ABNORMAL HIGH (ref 0.61–1.24)
GFR calc Af Amer: 37 mL/min — ABNORMAL LOW (ref >60)
GFR calc non Af Amer: 32 mL/min — ABNORMAL LOW (ref >60)
Glucose, Bld: 155 mg/dL — ABNORMAL HIGH (ref 70–99)
Potassium: 4.1 mmol/L (ref 3.5–5.1)
Sodium: 139 mmol/L (ref 135–145)

## 2018-07-31 LAB — BLOOD GAS, ARTERIAL
Acid-base deficit: 4.8 mmol/L — ABNORMAL HIGH (ref 0.0–2.0)
Bicarbonate: 24.4 mmol/L (ref 20.0–28.0)
Drawn by: 257881
FIO2: 50
MECHVT: 440 mL
O2 Saturation: 93.4 %
PEEP: 15 cmH2O
Patient temperature: 37
RATE: 30 resp/min
pCO2 arterial: 63.3 mmHg — ABNORMAL HIGH (ref 32.0–48.0)
pH, Arterial: 7.21 — ABNORMAL LOW (ref 7.350–7.450)
pO2, Arterial: 75.7 mmHg — ABNORMAL LOW (ref 83.0–108.0)

## 2018-07-31 LAB — TRIGLYCERIDES: Triglycerides: 552 mg/dL — ABNORMAL HIGH (ref <150)

## 2018-07-31 LAB — PHOSPHORUS: Phosphorus: 6.6 mg/dL — ABNORMAL HIGH (ref 2.5–4.6)

## 2018-07-31 LAB — MAGNESIUM: Magnesium: 2.5 mg/dL — ABNORMAL HIGH (ref 1.7–2.4)

## 2018-07-31 MED ORDER — DEXTROSE 50 % IV SOLN
12.5000 g | INTRAVENOUS | Status: AC
  Administered 2018-07-31: 20:00:00 12.5 g via INTRAVENOUS

## 2018-07-31 MED ORDER — ORAL CARE MOUTH RINSE
15.0000 mL | OROMUCOSAL | Status: DC
  Administered 2018-07-31 – 2018-08-22 (×209): 15 mL via OROMUCOSAL

## 2018-07-31 MED ORDER — VITAL HIGH PROTEIN PO LIQD: 1000.0000 mL | ORAL | Status: DC

## 2018-07-31 MED ORDER — CHLORHEXIDINE GLUCONATE 0.12% ORAL RINSE (MEDLINE KIT)
15.0000 mL | Freq: Two times a day (BID) | OROMUCOSAL | Status: DC
  Administered 2018-07-31 – 2018-08-22 (×43): 15 mL via OROMUCOSAL

## 2018-07-31 MED ORDER — PRO-STAT SUGAR FREE PO LIQD: 60.0000 mL | Freq: Every day | ORAL | Status: DC

## 2018-07-31 NOTE — Progress Notes (Addendum)
Initial Nutrition Assessment  RD working remotely.   DOCUMENTATION CODES:   Morbid obesity  INTERVENTION:  - if feasible, recommend small bore post-pyloric tube placement given patient being proned. - will adjust TF regimen: Vital High Protein @ 15 ml/hr with 60 ml prostat x5/day (10 packets/day) - this regimen + kcal from current propofol rate will provide 2041 kcal (1360 kcal without propofol), 181 grams protein (96% estimated need), 788 mg Phos, and 301 ml free water.  - will continue to monitor propofol rate and changes and adjust TF regimen as needed.  - free water flush, if desired, to be per MD/NP.    NUTRITION DIAGNOSIS:   Inadequate oral intake related to inability to eat as evidenced by NPO status.  GOAL:   Provide needs based on ASPEN/SCCM guidelines  MONITOR:   Vent status, TF tolerance, Weight trends, Labs  REASON FOR ASSESSMENT:   Ventilator, Consult Enteral/tube feeding initiation and management  ASSESSMENT:   53 year-old male admitted on 4/1 with abdominal pain which had been persistent for several days; noted to have diverticulitis at [facility] and was transferred to [facility] for further evaluation. Patient found to be COVID POSITIVE at PCP office earlier in the week. PCCM consulted 4/2 for worsening hypoxemia.   Patient was intubated yesterday at ~1:20 PM. No NGT/OGT indicated on LDA avatar, but per CXR yesterday at 3:30 PM, "nasal/orogastric tube passes below the diaphragm into the stomach and below the included field of view." Order currently in place per TF protocol: Vital High Protein @ 40 ml/hr with 30 ml prostat BID. This regimen provides 1160 kcal, 114 grams of protein, 868 mg Phos, and 802 ml free water.  Per review of RN flow sheet, weaning propofol down d/t hypertriglyceridemia. Patient is being proned. Patient flipped back to supine today at 5:45 AM. Will continue to monitor positioning.   Per Dr. Anastasia Pall note yesterday: COVID-19  positive, ARDS, OSA. Notes indicate patient was a difficult intubation.   Per chart review, current weight is 295 lb and weight on 10/24/17 at [facility] was 293 lb. No other recent weight information available in the chart.    Patient is currently intubated on ventilator support MV: 13 L/min as of 8 AM Temp (24hrs), Avg:99.6 F (37.6 C), Min:98.2 F (36.8 C), Max:100.9 F (38.3 C) Propofol: 23.9 ml/hr (as of 8:17 AM; 631 kcal)  Medications reviewed; sliding scale novolog, 40 mEq KCl per OGT x1 dose 4/2. Labs reviewed; BUN: 31 mg/dl, creatinine: 2.27 mg/dl, Ca: 7.9 mg/dl, Phos: 6.6 mg/dl, Mg: 2.5 mg/dl, GFR: 32 ml/min, triglycerides @ 552 mg/dl. IVF; NS @ 75 ml/hr. Drips; fentanyl @ 400 mcg/hr, propofol @ 30 mcg/kg/min.     NUTRITION - FOCUSED PHYSICAL EXAM:  Unable to complete at this time.   Diet Order:   Diet Order            Diet NPO time specified Except for: Ice Chips, Sips with Meds  Diet effective now              EDUCATION NEEDS:   No education needs have been identified at this time  Skin:  Skin Assessment: Reviewed RN Assessment  Last BM:  4/1  Height:   Ht Readings from Last 1 Encounters:  07/30/18 5\' 10"  (1.778 m)    Weight:   Wt Readings from Last 1 Encounters:  07/31/18 135.1 kg    Ideal Body Weight:  75.45 kg  BMI:  Body mass index is 42.74 kg/m.  Estimated Nutritional  Needs:   Kcal:  4158-3094 kcal  Protein:  >/= 189 grams  Fluid:  >/= 2.2 L/day     Jarome Matin, MS, RD, LDN, [facility] Inpatient Clinical Dietitian Pager # [phone] After hours/weekend pager # [phone]

## 2018-07-31 NOTE — Progress Notes (Signed)
Hypoglycemic Event  CBG: 58  Treatment: 12.5g d50  Symptoms: none  Follow-up CBG: Time:2040 CBG Result:178  Possible Reasons for Event :npo  Comments/MD notified:protocol    Amo Kuffour, Trilby Drummer

## 2018-07-31 NOTE — Progress Notes (Signed)
NUTRITION NOTE RD working remotely.   Received secure chat from RN. TF not to be infusing d/t perforated diverticulitis. Will d/c order for TF and will continue to monitor for plan concerning nutrition support.     Jarome Matin, MS, RD, LDN, [facility] Inpatient Clinical Dietitian Pager # [phone] After hours/weekend pager # [phone]

## 2018-07-31 NOTE — Progress Notes (Addendum)
NAME:  [name] Long, MRN:  [id number], DOB:  Apr 21, 1966, LOS: 2 ADMISSION DATE:  08/10/2018, CONSULTATION DATE:  07/30/2018 REFERRING MD:  07/30/2018, CHIEF COMPLAINT:   Brief History   53 y/o male admitted on 4/1 with abdominal pain which had been persistent for several days.  COVID POSITIVE.  PCCM consulted 4/2 for worsening hypoxemia.    He was noted to be COVID positive by a PCP check earlier in the week.  He went to [facility] with abdominal pain, noted to have diverticulitis, was trasnferred to [facility] for further evaluation.    Past Medical History  DM2, HTN, Former smoker, OSA  Significant Hospital Events   4/1 admission for diverticulitis 4/2 PCCM consulted for worsening dyspnea, hypoxemia 4/2 Proned overnight 4/3 Proned overnight for 2nd night  Consults:  4/2 PCCM  Procedures:  4/2 ETT >   Significant Diagnostic Tests:    Micro Data:  3/30 COVID 19 test positive (outside PCP)  Antimicrobials:  4/1 zosyn >   Interim history/subjective:  Tolerated proning overnight. FIO2 reduced to 50%  Objective   Blood pressure (!) 110/45, pulse (!) 106, temperature 98.9 F (37.2 C), temperature source Oral, resp. rate (!) 30, height 5\' 10"  (1.778 m), weight 135.1 kg, SpO2 94 %.    Vent Mode: PRVC FiO2 (%):  [50 %-100 %] 50 % Set Rate:  [30 bmp] 30 bmp Vt Set:  [440 mL] 440 mL PEEP:  [12 cmH20-15 cmH20] 12 cmH20 Plateau Pressure:  [23 cmH20-28 cmH20] 23 cmH20   Intake/Output Summary (Last 24 hours) at 07/31/2018 1909 Last data filed at 07/31/2018 1800 Gross per 24 hour  Intake 2330.2 ml  Output 745 ml  Net 1585.2 ml   Filed Weights   08/01/2018 2147 07/30/18 0226 07/31/18 0300  Weight: 133.8 kg 132.5 kg 135.1 kg   Physical Exam: General: Critically ill-appearing, no acute distress HENT: [place], AT, ETT in place Eyes: EOMI, no scleral icterus Respiratory: Diminished breath sounds, vented breathing, non-labored Cardiovascular: RRR, -M/R/G, no JVD GI:  soft, nontender  Extremities:-Edema,-tenderness Neuro: Sedated Skin: Intact, no rashes or bruising  Resolved Hospital Problem list     Assessment & Plan:  Diverticulitis: > continue Zosyn  Acute respiratory failure with hypoxemia: rapidly progressive overnight > Continue full vent support > VAP prevention > PAD protocol > ARDS protocol for mechanical ventilation > Paralyzed and proned on 4/2. Will prone again this evening  SARS COV Pneumonia > Continue plaquenil > Continue azithromycin > Unable to give tocilizumab due to diverticulitis > not at risk for TB given occupation (discussed with wife)  DM2 > SSI q4  OSA > on mechanical vent   Best practice:  Diet:  tube feeding Pain/Anxiety/Delirium protocol (if indicated): yes, RASS -4 VAP protocol (if indicated): yes DVT prophylaxis: sub q heparin GI prophylaxis: pantoprazole Glucose control: SSI Mobility: bed rest Code Status: full Family Communication: Called and updated wife on 4/3 Disposition: ICU  Labs   CBC: Recent Labs  Lab 07/30/18 0247  WBC 2.5*  HGB 16.5  HCT 51.0  MCV 88.5  PLT 70*    Basic Metabolic Panel: Recent Labs  Lab 07/30/18 0247 07/30/18 1845 07/31/18 0333  NA 137  --  139  K 3.2*  --  4.1  CL 102  --  104  CO2 23  --  23  GLUCOSE 124*  --  155*  BUN 18  --  31*  CREATININE 1.22  --  2.27*  CALCIUM 8.1*  --  7.9*  MG  --  2.1 2.5*  PHOS  --  5.6* 6.6*   GFR: Estimated Creatinine Clearance: 52.7 mL/min (A) (by C-G formula based on SCr of 2.27 mg/dL (H)). Recent Labs  Lab 07/30/18 0247  WBC 2.5*    Liver Function Tests: Recent Labs  Lab 07/30/18 0247 07/30/18 1845  AST 61* 60*  ALT 36 34  ALKPHOS 56 57  BILITOT 1.2 1.2  PROT 6.8 6.3*  ALBUMIN 3.2* 2.9*   No results for input(s): LIPASE, AMYLASE in the last 168 hours. No results for input(s): AMMONIA in the last 168 hours.  ABG    Component Value Date/Time   PHART 7.210 (L) 07/31/2018 0755   PCO2ART 63.3 (H) 07/31/2018  0755   PO2ART 75.7 (L) 07/31/2018 0755   HCO3 24.4 07/31/2018 0755   ACIDBASEDEF 4.8 (H) 07/31/2018 0755   O2SAT 93.4 07/31/2018 0755     Coagulation Profile: No results for input(s): INR, PROTIME in the last 168 hours.  Cardiac Enzymes: No results for input(s): CKTOTAL, CKMB, CKMBINDEX, TROPONINI in the last 168 hours.  HbA1C: No results found for: HGBA1C  CBG: Recent Labs  Lab 07/31/18 0011 07/31/18 0255 07/31/18 0914 07/31/18 1334 07/31/18 1703  GLUCAP 181* 170* 187* 125* 109*    Critical care time: 33 minutes    Rodman Pickle, M.D. [facility] Pulmonary/Critical Care Medicine Pager: [phone] After hours pager: [phone]

## 2018-07-31 NOTE — Progress Notes (Signed)
Pt. placed back to supine position at ~ 0545 after FI02 being able to be weaned down to 64% with no complications.

## 2018-07-31 NOTE — Progress Notes (Signed)
Patient proned without complication. Tolerated well. RT will continue to monitor patient.

## 2018-08-01 LAB — GLUCOSE, CAPILLARY
Glucose-Capillary: 105 mg/dL — ABNORMAL HIGH (ref 70–99)
Glucose-Capillary: 112 mg/dL — ABNORMAL HIGH (ref 70–99)
Glucose-Capillary: 116 mg/dL — ABNORMAL HIGH (ref 70–99)
Glucose-Capillary: 121 mg/dL — ABNORMAL HIGH (ref 70–99)
Glucose-Capillary: 126 mg/dL — ABNORMAL HIGH (ref 70–99)
Glucose-Capillary: 127 mg/dL — ABNORMAL HIGH (ref 70–99)
Glucose-Capillary: 178 mg/dL — ABNORMAL HIGH (ref 70–99)

## 2018-08-01 LAB — BLOOD GAS, ARTERIAL
Acid-base deficit: 7.8 mmol/L — ABNORMAL HIGH (ref 0.0–2.0)
Acid-base deficit: 8.4 mmol/L — ABNORMAL HIGH (ref 0.0–2.0)
Bicarbonate: 21.9 mmol/L (ref 20.0–28.0)
Bicarbonate: 22 mmol/L (ref 20.0–28.0)
FIO2: 0.7
FIO2: 70
MECHVT: 440 mL
MECHVT: 440 mL
O2 Saturation: 96.8 %
O2 Saturation: 95.5 %
PEEP: 15 cmH2O
PEEP: 15 cmH2O
Patient temperature: 100.5
Patient temperature: 98.6
RATE: 30 resp/min
RATE: 30 resp/min
pCO2 arterial: 66.9 mmHg (ref 32.0–48.0)
pCO2 arterial: 66.6 mmHg (ref 32.0–48.0)
pH, Arterial: 7.149 — CL (ref 7.350–7.450)
pH, Arterial: 7.146 — CL (ref 7.350–7.450)
pO2, Arterial: 105 mmHg (ref 83.0–108.0)
pO2, Arterial: 89.2 mmHg (ref 83.0–108.0)

## 2018-08-01 LAB — CBC
HCT: 47.6 % (ref 39.0–52.0)
Hemoglobin: 14.8 g/dL (ref 13.0–17.0)
MCH: 29.3 pg (ref 26.0–34.0)
MCHC: 31.1 g/dL (ref 30.0–36.0)
MCV: 94.3 fL (ref 80.0–100.0)
Platelets: 123 10*3/uL — ABNORMAL LOW (ref 150–400)
RBC: 5.05 MIL/uL (ref 4.22–5.81)
RDW: 17.1 % — ABNORMAL HIGH (ref 11.5–15.5)
WBC: 3 10*3/uL — ABNORMAL LOW (ref 4.0–10.5)
nRBC: 0 % (ref 0.0–0.2)

## 2018-08-01 LAB — TRIGLYCERIDES: Triglycerides: 639 mg/dL — ABNORMAL HIGH (ref <150)

## 2018-08-01 LAB — HEPATITIS PANEL, ACUTE
HCV Ab: <0.1 s/co ratio (ref 0.0–0.9)
Hep A IgM: NEGATIVE
Hep B C IgM: NEGATIVE
Hepatitis B Surface Ag: NEGATIVE

## 2018-08-01 LAB — BASIC METABOLIC PANEL
Anion gap: 12 (ref 5–15)
BUN: 54 mg/dL — ABNORMAL HIGH (ref 6–20)
CO2: 22 mmol/L (ref 22–32)
Calcium: 7.6 mg/dL — ABNORMAL LOW (ref 8.9–10.3)
Chloride: 106 mmol/L (ref 98–111)
Creatinine, Ser: 3.64 mg/dL — ABNORMAL HIGH (ref 0.61–1.24)
GFR calc Af Amer: 21 mL/min — ABNORMAL LOW (ref >60)
GFR calc non Af Amer: 18 mL/min — ABNORMAL LOW (ref >60)
Glucose, Bld: 122 mg/dL — ABNORMAL HIGH (ref 70–99)
Potassium: 4.4 mmol/L (ref 3.5–5.1)
Sodium: 140 mmol/L (ref 135–145)

## 2018-08-01 LAB — PHOSPHORUS: Phosphorus: 6.3 mg/dL — ABNORMAL HIGH (ref 2.5–4.6)

## 2018-08-01 LAB — MAGNESIUM: Magnesium: 2.5 mg/dL — ABNORMAL HIGH (ref 1.7–2.4)

## 2018-08-01 NOTE — Progress Notes (Signed)
NAME:  [name] Long, MRN:  [id number], DOB:  07/30/1965, LOS: 3 ADMISSION DATE:  08/12/2018, CONSULTATION DATE:  07/30/2018 REFERRING MD:  07/30/2018, CHIEF COMPLAINT:   Brief History   53 y/o male admitted on 4/1 with abdominal pain which had been persistent for several days.  COVID POSITIVE.  PCCM consulted 4/2 for worsening hypoxemia.    He was noted to be COVID positive by a PCP check earlier in the week.  He went to [facility] with abdominal pain, noted to have diverticulitis, was trasnferred to [facility] for further evaluation.    Past Medical History  DM2, HTN, Former smoker, OSA  Significant Hospital Events   4/1 admission for diverticulitis 4/2 PCCM consulted for worsening dyspnea, hypoxemia 4/2 Proned  4/3 Proned   Consults:  4/2 PCCM  Procedures:  4/2 ETT >   Significant Diagnostic Tests:    Micro Data:  3/30 COVID 19 test positive (outside PCP)  Antimicrobials:  4/1 zosyn >   Interim history/subjective:  Proned overnight  Objective   Blood pressure (!) 110/45, pulse (!) 110, temperature (!) 100.5 F (38.1 C), temperature source Rectal, resp. rate (!) 30, height 5\' 10"  (1.778 m), weight (!) 137 kg, SpO2 95 %.    Vent Mode: PRVC FiO2 (%):  [50 %-100 %] 80 % Set Rate:  [30 bmp] 30 bmp Vt Set:  [440 mL] 440 mL PEEP:  [12 cmH20-15 cmH20] 15 cmH20 Plateau Pressure:  [23 cmH20-26 cmH20] 26 cmH20   Intake/Output Summary (Last 24 hours) at 08/01/2018 0850 Last data filed at 08/01/2018 0700 Gross per 24 hour  Intake 3715.41 ml  Output 725 ml  Net 2990.41 ml   Filed Weights   07/30/18 0226 07/31/18 0300 08/01/18 0600  Weight: 132.5 kg 135.1 kg (!) 137 kg   Physical Exam: General: Critically ill-appearing, facial edema HENT: [place], AT, ETT in place Respiratory: Vented breath sounds, non-labored Cardiovascular: RRR, no JVD GI: soft, nontender Extremities:-Edema,-tenderness Neuro: Sedated Skin: Intact, no rashes or bruising GU: Foley in place  Resolved Hospital  Problem list     Assessment & Plan:  Diverticulitis: > continue Zosyn  Acute respiratory failure with hypoxemia: rapidly progressive overnight > Continue full vent support > VAP prevention > PAD protocol > ARDS protocol for mechanical ventilation > Paralyzed and proned since 4/2 > ABG now  SARS COV Pneumonia > Continue plaquenil > Continue azithromycin > Check QTC > Unable to give tocilizumab due to diverticulitis > not at risk for TB given occupation (discussed with wife)  DM2 > SSI q4  OSA > on mechanical vent   Best practice:  Diet:  tube feeding Pain/Anxiety/Delirium protocol (if indicated): yes, RASS -4 VAP protocol (if indicated): yes DVT prophylaxis: sub q heparin GI prophylaxis: pantoprazole Glucose control: SSI Mobility: bed rest Code Status: full Family Communication: RN will contact wife on 4/4 Disposition: ICU  Labs   CBC: Recent Labs  Lab 07/30/18 0247 08/01/18 0430  WBC 2.5* 3.0*  HGB 16.5 14.8  HCT 51.0 47.6  MCV 88.5 94.3  PLT 70* 123*    Basic Metabolic Panel: Recent Labs  Lab 07/30/18 0247 07/30/18 1845 07/31/18 0333 08/01/18 0430  NA 137  --  139 140  K 3.2*  --  4.1 4.4  CL 102  --  104 106  CO2 23  --  23 22  GLUCOSE 124*  --  155* 122*  BUN 18  --  31* 54*  CREATININE 1.22  --  2.27* 3.64*  CALCIUM 8.1*  --  7.9* 7.6*  MG  --  2.1 2.5* 2.5*  PHOS  --  5.6* 6.6* 6.3*   GFR: Estimated Creatinine Clearance: 33.1 mL/min (A) (by C-G formula based on SCr of 3.64 mg/dL (H)). Recent Labs  Lab 07/30/18 0247 08/01/18 0430  WBC 2.5* 3.0*    Liver Function Tests: Recent Labs  Lab 07/30/18 0247 07/30/18 1845  AST 61* 60*  ALT 36 34  ALKPHOS 56 57  BILITOT 1.2 1.2  PROT 6.8 6.3*  ALBUMIN 3.2* 2.9*   No results for input(s): LIPASE, AMYLASE in the last 168 hours. No results for input(s): AMMONIA in the last 168 hours.  ABG    Component Value Date/Time   PHART 7.210 (L) 07/31/2018 0755   PCO2ART 63.3 (H)  07/31/2018 0755   PO2ART 75.7 (L) 07/31/2018 0755   HCO3 24.4 07/31/2018 0755   ACIDBASEDEF 4.8 (H) 07/31/2018 0755   O2SAT 93.4 07/31/2018 0755     Coagulation Profile: No results for input(s): INR, PROTIME in the last 168 hours.  Cardiac Enzymes: No results for input(s): CKTOTAL, CKMB, CKMBINDEX, TROPONINI in the last 168 hours.  HbA1C: No results found for: HGBA1C  CBG: Recent Labs  Lab 07/31/18 1703 07/31/18 2011 07/31/18 2046 08/01/18 0017 08/01/18 0357  GLUCAP 109* 58* 178* 105* 112*    Critical care time: 41 minutes    Rodman Pickle, M.D. [facility] Pulmonary/Critical Care Medicine Pager: [phone] After hours pager: [phone]

## 2018-08-02 ENCOUNTER — Encounter (HOSPITAL_COMMUNITY): Payer: Self-pay | Admitting: Nephrology

## 2018-08-02 ENCOUNTER — Inpatient Hospital Stay (HOSPITAL_COMMUNITY): Payer: BLUE CROSS/BLUE SHIELD

## 2018-08-02 LAB — BASIC METABOLIC PANEL
Anion gap: 13 (ref 5–15)
BUN: 77 mg/dL — ABNORMAL HIGH (ref 6–20)
CO2: 19 mmol/L — ABNORMAL LOW (ref 22–32)
Calcium: 7.6 mg/dL — ABNORMAL LOW (ref 8.9–10.3)
Chloride: 105 mmol/L (ref 98–111)
Creatinine, Ser: 6.02 mg/dL — ABNORMAL HIGH (ref 0.61–1.24)
GFR calc Af Amer: 11 mL/min — ABNORMAL LOW (ref >60)
GFR calc non Af Amer: 10 mL/min — ABNORMAL LOW (ref >60)
Glucose, Bld: 162 mg/dL — ABNORMAL HIGH (ref 70–99)
Potassium: 5.3 mmol/L — ABNORMAL HIGH (ref 3.5–5.1)
Sodium: 137 mmol/L (ref 135–145)

## 2018-08-02 LAB — BLOOD GAS, ARTERIAL
Acid-base deficit: 9.2 mmol/L — ABNORMAL HIGH (ref 0.0–2.0)
Acid-base deficit: 10 mmol/L — ABNORMAL HIGH (ref 0.0–2.0)
Acid-base deficit: 10 mmol/L — ABNORMAL HIGH (ref 0.0–2.0)
Bicarbonate: 20.2 mmol/L (ref 20.0–28.0)
Bicarbonate: 18.8 mmol/L — ABNORMAL LOW (ref 20.0–28.0)
Bicarbonate: 18.7 mmol/L — ABNORMAL LOW (ref 20.0–28.0)
Drawn by: 257701
Drawn by: 257701
Drawn by: 270211
FIO2: 60
FIO2: 50
FIO2: 50
MECHVT: 510 mL
MECHVT: 510 mL
MECHVT: 510 mL
O2 Saturation: 97 %
O2 Saturation: 97.1 %
O2 Saturation: 97.4 %
PEEP: 15 cmH2O
PEEP: 15 cmH2O
PEEP: 15 cmH2O
Patient temperature: 99
Patient temperature: 99
Patient temperature: 98.6
RATE: 35 resp/min
RATE: 35 resp/min
RATE: 35 resp/min
pCO2 arterial: 57.5 mmHg — ABNORMAL HIGH (ref 32.0–48.0)
pCO2 arterial: 52.6 mmHg — ABNORMAL HIGH (ref 32.0–48.0)
pCO2 arterial: 51.6 mmHg — ABNORMAL HIGH (ref 32.0–48.0)
pH, Arterial: 7.173 — CL (ref 7.350–7.450)
pH, Arterial: 7.181 — CL (ref 7.350–7.450)
pH, Arterial: 7.183 — CL (ref 7.350–7.450)
pO2, Arterial: 104 mmHg (ref 83.0–108.0)
pO2, Arterial: 109 mmHg — ABNORMAL HIGH (ref 83.0–108.0)
pO2, Arterial: 114 mmHg — ABNORMAL HIGH (ref 83.0–108.0)

## 2018-08-02 LAB — URINALYSIS, ROUTINE W REFLEX MICROSCOPIC
Bilirubin Urine: NEGATIVE
Glucose, UA: NEGATIVE mg/dL
Ketones, ur: NEGATIVE mg/dL
Leukocytes,Ua: NEGATIVE
Nitrite: NEGATIVE
Protein, ur: 30 mg/dL — AB
Specific Gravity, Urine: 1.021 (ref 1.005–1.030)
pH: 5 (ref 5.0–8.0)

## 2018-08-02 LAB — RENAL FUNCTION PANEL
Albumin: 2.2 g/dL — ABNORMAL LOW (ref 3.5–5.0)
Anion gap: 16 — ABNORMAL HIGH (ref 5–15)
BUN: 86 mg/dL — ABNORMAL HIGH (ref 6–20)
CO2: 18 mmol/L — ABNORMAL LOW (ref 22–32)
Calcium: 7.5 mg/dL — ABNORMAL LOW (ref 8.9–10.3)
Chloride: 104 mmol/L (ref 98–111)
Creatinine, Ser: 7.21 mg/dL — ABNORMAL HIGH (ref 0.61–1.24)
GFR calc Af Amer: 9 mL/min — ABNORMAL LOW (ref >60)
GFR calc non Af Amer: 8 mL/min — ABNORMAL LOW (ref >60)
Glucose, Bld: 125 mg/dL — ABNORMAL HIGH (ref 70–99)
Phosphorus: 9.3 mg/dL — ABNORMAL HIGH (ref 2.5–4.6)
Potassium: 5.3 mmol/L — ABNORMAL HIGH (ref 3.5–5.1)
Sodium: 138 mmol/L (ref 135–145)

## 2018-08-02 LAB — GLUCOSE, CAPILLARY
Glucose-Capillary: 126 mg/dL — ABNORMAL HIGH (ref 70–99)
Glucose-Capillary: 139 mg/dL — ABNORMAL HIGH (ref 70–99)
Glucose-Capillary: 152 mg/dL — ABNORMAL HIGH (ref 70–99)
Glucose-Capillary: 152 mg/dL — ABNORMAL HIGH (ref 70–99)
Glucose-Capillary: 165 mg/dL — ABNORMAL HIGH (ref 70–99)

## 2018-08-02 LAB — SODIUM, URINE, RANDOM: Sodium, Ur: 17 mmol/L

## 2018-08-02 LAB — CREATININE, URINE, RANDOM: Creatinine, Urine: 197.03 mg/dL

## 2018-08-02 MED ORDER — SODIUM BICARBONATE 8.4 % IV SOLN
INTRAVENOUS | Status: DC
  Administered 2018-08-02: 16:00:00 via INTRAVENOUS
  Filled 2018-08-02: qty 150

## 2018-08-02 MED ORDER — PRISMASOL BGK 4/2.5 32-4-2.5 MEQ/L REPLACEMENT SOLN
Status: DC
  Filled 2018-08-02 (×2): qty 5000

## 2018-08-02 MED ORDER — SODIUM CHLORIDE 0.9 % IV SOLN
25.0000 ug/h | INTRAVENOUS | Status: DC
  Administered 2018-08-03 (×2): 400 ug/h via INTRAVENOUS
  Filled 2018-08-02 (×3): qty 50

## 2018-08-02 MED ORDER — HEPARIN SODIUM (PORCINE) 1000 UNIT/ML IJ SOLN
2000.0000 [IU] | Freq: Once | INTRAMUSCULAR | Status: DC
  Filled 2018-08-02: qty 2

## 2018-08-02 MED ORDER — SODIUM CHLORIDE 0.9 % FOR CRRT
INTRAVENOUS_CENTRAL | Status: DC | PRN
  Filled 2018-08-02: qty 1000

## 2018-08-02 MED ORDER — ALTEPLASE 2 MG IJ SOLR
2.0000 mg | Freq: Once | INTRAMUSCULAR | Status: AC | PRN
  Administered 2018-08-03: 2 mg
  Filled 2018-08-02: qty 2

## 2018-08-02 MED ORDER — HEPARIN SODIUM (PORCINE) 1000 UNIT/ML DIALYSIS
1000.0000 [IU] | INTRAMUSCULAR | Status: DC | PRN
  Filled 2018-08-02: qty 6

## 2018-08-02 MED ORDER — PRISMASOL BGK 4/2.5 32-4-2.5 MEQ/L IV SOLN
INTRAVENOUS | Status: DC
  Administered 2018-08-02 – 2018-08-04 (×10): via INTRAVENOUS_CENTRAL
  Filled 2018-08-02 (×21): qty 5000

## 2018-08-02 MED ORDER — PIPERACILLIN-TAZOBACTAM IN DEX 2-0.25 GM/50ML IV SOLN
2.2500 g | Freq: Three times a day (TID) | INTRAVENOUS | Status: DC
  Administered 2018-08-02 – 2018-08-03 (×2): 2.25 g via INTRAVENOUS
  Filled 2018-08-02 (×5): qty 50

## 2018-08-02 MED ORDER — PRISMASOL BGK 4/2.5 32-4-2.5 MEQ/L REPLACEMENT SOLN
Status: DC
  Administered 2018-08-03: 06:00:00 via INTRAVENOUS_CENTRAL
  Filled 2018-08-02 (×5): qty 5000

## 2018-08-02 NOTE — Progress Notes (Signed)
NAME:  [name] Long, MRN:  [id number], DOB:  [birth year], LOS: 4 ADMISSION DATE:  08/05/2018, CONSULTATION DATE:  07/30/2018 REFERRING MD:  07/30/2018, CHIEF COMPLAINT:   Brief History   53 y/o male admitted on 4/1 with abdominal pain which had been persistent for several days.  COVID POSITIVE.  PCCM consulted 4/2 for worsening hypoxemia.    He was noted to be COVID positive by a PCP check earlier in the week.  He went to [facility] with abdominal pain, noted to have diverticulitis, was trasnferred to [facility] for further evaluation.    Past Medical History  DM2, HTN, Former smoker, OSA  Significant Hospital Events   4/1 admission for diverticulitis 4/2 PCCM consulted for worsening dyspnea, hypoxemia 4/2 Proned  4/3 Proned   Consults:  4/2 PCCM  Procedures:  4/2 ETT >   Significant Diagnostic Tests:    Micro Data:  3/30 COVID 19 test positive (outside PCP)  Antimicrobials:  4/1 zosyn >   Interim history/subjective:  Improved oxygenation however ABG with respiratory and metabolic acidosis. Poor UOP.  Objective   Blood pressure (!) 117/50, pulse (!) 108, temperature 100.2 F (37.9 C), temperature source Rectal, resp. rate (!) 35, height 5\' 10"  (1.778 m), weight (!) 141.4 kg, SpO2 97 %.    Vent Mode: PRVC FiO2 (%):  [50 %-70 %] 50 % Set Rate:  [30 bmp-35 bmp] 35 bmp Vt Set:  [440 mL-510 mL] 510 mL PEEP:  [15 cmH20] 15 cmH20 Plateau Pressure:  [26 cmH20-32 cmH20] 32 cmH20   Intake/Output Summary (Last 24 hours) at 08/02/2018 1248 Last data filed at 08/02/2018 1200 Gross per 24 hour  Intake 1363.25 ml  Output 400 ml  Net 963.25 ml   Filed Weights   07/31/18 0300 08/01/18 0600 08/02/18 0423  Weight: 135.1 kg (!) 137 kg (!) 141.4 kg   Physical Exam: General: Critically ill-appearing, morbidly obese, facial edema HENT: [place], AT, ETT in place Respiratory: Non-labored, vented breath sounds Cardiovascular: RRR, -M/R/G, no JVD GI: soft, nontender  Extremities:-Edema,-tenderness Neuro: Sedated GU: Foley in place   Resolved Hospital Problem list     Assessment & Plan:  Diverticulitis: > continue Zosyn  Acute respiratory failure with hypoxemia: rapidly progressive overnight > Full vent support > VAP prevention > PAD protocol > ARDS protocol for mechanical ventilation > Paralyzed and proned since 4/2 > Recheck ABG at 1 and 4 hours post-supine and 1 hour post-prone > Titrate PEEP and FIO2 for goal SpO2 55-80 > Zosyn x 7 days (end 4/7) > Unable to diurese due to renal failure. Plan for CRRT  SARS COV Pneumonia > Continue plaquenil (end date 4/6) > Continue azithromycin (end date 4/6) > Check QTC. Today 0.49 > Unable to give tocilizumab due to diverticulitis > not at risk for TB given occupation (discussed with wife)  AKI with metabolic acidosis > Nephrology consulted  > Plan for CRRT for acidosis and volume removal  DM2 > SSI q4  OSA > on mechanical vent   Best practice:  Diet:  tube feeding Pain/Anxiety/Delirium protocol (if indicated): yes, RASS -4 VAP protocol (if indicated): yes DVT prophylaxis: sub q heparin GI prophylaxis: pantoprazole Glucose control: SSI Mobility: bed rest Code Status: full Family Communication: RN will contact wife on 4/4 Disposition: ICU  Labs   CBC: Recent Labs  Lab 07/30/18 0247 08/01/18 0430  WBC 2.5* 3.0*  HGB 16.5 14.8  HCT 51.0 47.6  MCV 88.5 94.3  PLT 70* 123*    Basic Metabolic Panel: Recent  Labs  Lab 07/30/18 0247 07/30/18 1845 07/31/18 0333 08/01/18 0430 08/02/18 0400  NA 137  --  139 140 137  K 3.2*  --  4.1 4.4 5.3*  CL 102  --  104 106 105  CO2 23  --  23 22 19*  GLUCOSE 124*  --  155* 122* 162*  BUN 18  --  31* 54* 77*  CREATININE 1.22  --  2.27* 3.64* 6.02*  CALCIUM 8.1*  --  7.9* 7.6* 7.6*  MG  --  2.1 2.5* 2.5*  --   PHOS  --  5.6* 6.6* 6.3*  --    GFR: Estimated Creatinine Clearance: 20.4 mL/min (A) (by C-G formula based on SCr of 6.02  mg/dL (H)). Recent Labs  Lab 07/30/18 0247 08/01/18 0430  WBC 2.5* 3.0*    Liver Function Tests: Recent Labs  Lab 07/30/18 0247 07/30/18 1845  AST 61* 60*  ALT 36 34  ALKPHOS 56 57  BILITOT 1.2 1.2  PROT 6.8 6.3*  ALBUMIN 3.2* 2.9*   No results for input(s): LIPASE, AMYLASE in the last 168 hours. No results for input(s): AMMONIA in the last 168 hours.  ABG    Component Value Date/Time   PHART 7.181 (LL) 08/02/2018 1200   PCO2ART 52.6 (H) 08/02/2018 1200   PO2ART 109 (H) 08/02/2018 1200   HCO3 18.8 (L) 08/02/2018 1200   ACIDBASEDEF 10.0 (H) 08/02/2018 1200   O2SAT 97.1 08/02/2018 1200     Coagulation Profile: No results for input(s): INR, PROTIME in the last 168 hours.  Cardiac Enzymes: No results for input(s): CKTOTAL, CKMB, CKMBINDEX, TROPONINI in the last 168 hours.  HbA1C: No results found for: HGBA1C  CBG: Recent Labs  Lab 08/01/18 2152 08/02/18 0043 08/02/18 0412 08/02/18 0824 08/02/18 1146  GLUCAP 127* 165* 139* 152* 152*    Critical care time: 45 min    Rodman Pickle, M.D. [facility] Pulmonary/Critical Care Medicine Pager: [phone] After hours pager: [phone]

## 2018-08-02 NOTE — Progress Notes (Signed)
Pharmacy Antibiotic Note  [name] Long is a 53 y.o. male with diverticulitis of colon with perforation and positive COVID-19 currently on zosyn for intra-abdominal infection.  Today, 08/02/2018: - Tmax 99.6, wbc low on 4/4 - scr increased from 3.64 to 6.02 (crcl~14 N)   Plan: - change zosyn to 2.25 gm IV q8h (infuse over 30 min) - monitor renal function  __________________________________________  Height: 5\' 10"  (177.8 cm) Weight: (!) 311 lb 11.7 oz (141.4 kg) IBW/kg (Calculated) : 73  Temp (24hrs), Avg:98.4 F (36.9 C), Min:97.6 F (36.4 C), Max:99 F (37.2 C)  Recent Labs  Lab 07/30/18 0247 07/31/18 0333 08/01/18 0430 08/02/18 0400  WBC 2.5*  --  3.0*  --   CREATININE 1.22 2.27* 3.64* 6.02*    Estimated Creatinine Clearance: 20.4 mL/min (A) (by C-G formula based on SCr of 6.02 mg/dL (H)).    No Known Allergies  Antimicrobials this admission: 4/2 zosyn >>  4/2 azithromycin >> 4/2  HCQ >> (4/6)  Dose adjustments this admission: --  Microbiology results: COVID + per H and P note on 4/1 while outpt   4/2 MRSA PCR: negative  4/2 hepatitis panel: neg   Thank you for allowing pharmacy to be a part of this patient's care.  Lynelle Doctor 08/02/2018 11:07 AM

## 2018-08-02 NOTE — Consult Note (Signed)
Renal Service Consult Note [facility]  Abbott Jasinski 08/02/2018 Sol Blazing Requesting Physician:  Dr Loanne Drilling  Reason for Consult:  Acute renal failure HPI: The patient is a 53 y.o. year-old with hx of DM2, HTN, OSA on CPAP presented to ED on 4/1 at [facility] w/ lower abd pain for 3 days. In ED CTA chest showed bilat infiltrates c/w atypical PNA, CTA abd / pelvis showed perforated diverticulitis, no abscess and localized air, poss cirrhosis component w/ splenomegaly. PCP had tested patient on 3/30 for COVID and it returned positive while pt was in ED. Pt was transferred to [facility] and admitted to ICU.  Later on 4/2 the pt's O2 requirement increased rapidly and he required intubation. CCM started ARDS protocol, plaquenil, azithromycin for SARS-2 COV pna.  He was also proned on 4/2. Renal function has worsened over the past 4 days with creat 1.2 >> 6.0 today.  Pt has resp acidosis w/o much metabolic compensation due to renal failure.  Asked to see for CRRT.    Patient got IV contrast with    ROS  n/a   Past Medical History  Past Medical History:  Diagnosis Date  . A-fib ([place])    ?  . DM2 (diabetes mellitus, type 2) ([place])   . HLD (hyperlipidemia)   . HTN (hypertension)   . OSA (obstructive sleep apnea)    Past Surgical History  Past Surgical History:  Procedure Laterality Date  . APPENDECTOMY  age 17  . PALATE / UVULA BIOPSY / EXCISION  2009  . TONSILLECTOMY AND ADENOIDECTOMY  age 61  . TOTAL KNEE ARTHROPLASTY  1996 / 2008   bilateral  . VASECTOMY  1994   Family History  Family History  Problem Relation Age of Onset  . Heart disease Other   . Heart disease Father   . Cancer Mother   . Heart disease Mother   . Hypertension Mother    Social History  reports that he quit smoking about 9 years ago. His smoking use included cigarettes. He has never used smokeless tobacco. He reports current alcohol use. He reports that he does not use drugs. Allergies No Known  Allergies Home medications Prior to Admission medications   Medication Sig Start Date End Date Taking? Authorizing Provider  aspirin 81 MG tablet Take 81 mg by mouth daily.     Yes [provider]  atorvastatin (LIPITOR) 40 MG tablet Take 40 mg by mouth daily. 10/24/17  Yes [provider]  buPROPion (WELLBUTRIN SR) 200 MG 12 hr tablet Take 200 mg by mouth 2 (two) times daily.   Yes [provider]  co-enzyme Q-10 30 MG capsule Take 30 mg by mouth daily.   Yes [provider]  empagliflozin (JARDIANCE) 25 MG TABS tablet Take 25 mg by mouth daily.   Yes [provider]  Glucos-Chond-Hyal Ac-Ca Fructo (MOVE FREE JOINT HEALTH ADVANCE PO) Take 1 tablet by mouth daily.   Yes [provider]  metFORMIN (GLUCOPHAGE-XR) 500 MG 24 hr tablet Take 1,000 mg by mouth daily.   Yes [provider]  metoprolol tartrate (LOPRESSOR) 25 MG tablet Take 25 mg by mouth 2 (two) times daily.   Yes [provider]  spironolactone (ALDACTONE) 25 MG tablet Take 25 mg by mouth daily. 11/22/15  Yes [provider]   Liver Function Tests Recent Labs  Lab 07/30/18 0247 07/30/18 1845  AST 61* 60*  ALT 36 34  ALKPHOS 56 57  BILITOT 1.2 1.2  PROT 6.8 6.3*  ALBUMIN 3.2* 2.9*   No results for input(s): LIPASE, AMYLASE in the last 168 hours. CBC Recent Labs  Lab 07/30/18 0247 08/01/18 0430  WBC 2.5* 3.0*  HGB 16.5 14.8  HCT 51.0 47.6  MCV 88.5 94.3  PLT 70* 364*   Basic Metabolic Panel Recent Labs  Lab 07/30/18 0247 07/30/18 1845 07/31/18 0333 08/01/18 0430 08/02/18 0400  NA 137  --  139 140 137  K 3.2*  --  4.1 4.4 5.3*  CL 102  --  104 106 105  CO2 23  --  23 22 19*  GLUCOSE 124*  --  155* 122* 162*  BUN 18  --  31* 54* 77*  CREATININE 1.22  --  2.27* 3.64* 6.02*  CALCIUM 8.1*  --  7.9* 7.6* 7.6*  PHOS  --  5.6* 6.6* 6.3*  --    Iron/TIBC/Ferritin/ %Sat No results found for: IRON, TIBC, FERRITIN,  IRONPCTSAT  Vitals:   08/02/18 0932 08/02/18 1000 08/02/18 1100 08/02/18 1200  BP:    (!) 117/50  Pulse:  (!) 107 (!) 106 (!) 108  Resp:  (!) 35 (!) 35 (!) 35  Temp:    100.2 F (37.9 C)  TempSrc:    Rectal  SpO2: 94% 97% 98% 97%  Weight:      Height:       Exam  Patient not examined directly given COVID-19 + status, utilizing exam of the primary team CCM.      Home meds:  - aspirin 81/ atorvastatin 40 qd  - metoprolol 25 bid/ spironolactone 25 qd  - metformin 1 gm daily/ empagliflozin 25 qd  - bupropion 200 bid  I/O since admit 7L in and 2L out UOP yest 400 cc  CXR 4/2 - bilat patchy pna  CTA chest (from [facility]) - patchy bilat infiltrates  UA pending   [facility] abd [facility] on 4/1 - R kidney visualized, no hydro noted, probably there is dye in the renal pelvis   Assessment: 1. Acute renal failure - check urine lytes/ UA. Suspect ATN possibly contrast -related and/or hypoperfusion from septic picture.  Worsening resp acidosis, unable to compensate well w/ AKI.  Asked to see for CRRT.  See orders.   2. SARS2 COV PNA - on vent, high support 3. DM2 4. Obesity/ OSA 5. Hx afib    Plan: 1. Will follow.       [facility] 08/02/2018, 3:14 PM

## 2018-08-03 LAB — RENAL FUNCTION PANEL
Albumin: 2.6 g/dL — ABNORMAL LOW (ref 3.5–5.0)
Albumin: 2.4 g/dL — ABNORMAL LOW (ref 3.5–5.0)
Anion gap: 17 — ABNORMAL HIGH (ref 5–15)
Anion gap: 16 — ABNORMAL HIGH (ref 5–15)
BUN: 68 mg/dL — ABNORMAL HIGH (ref 6–20)
BUN: 65 mg/dL — ABNORMAL HIGH (ref 6–20)
CO2: 16 mmol/L — ABNORMAL LOW (ref 22–32)
CO2: 20 mmol/L — ABNORMAL LOW (ref 22–32)
Calcium: 7.9 mg/dL — ABNORMAL LOW (ref 8.9–10.3)
Calcium: 8 mg/dL — ABNORMAL LOW (ref 8.9–10.3)
Chloride: 103 mmol/L (ref 98–111)
Chloride: 101 mmol/L (ref 98–111)
Creatinine, Ser: 5.95 mg/dL — ABNORMAL HIGH (ref 0.61–1.24)
Creatinine, Ser: 5.6 mg/dL — ABNORMAL HIGH (ref 0.61–1.24)
GFR calc Af Amer: 12 mL/min — ABNORMAL LOW (ref >60)
GFR calc Af Amer: 12 mL/min — ABNORMAL LOW (ref >60)
GFR calc non Af Amer: 10 mL/min — ABNORMAL LOW (ref >60)
GFR calc non Af Amer: 11 mL/min — ABNORMAL LOW (ref >60)
Glucose, Bld: 140 mg/dL — ABNORMAL HIGH (ref 70–99)
Glucose, Bld: 196 mg/dL — ABNORMAL HIGH (ref 70–99)
Phosphorus: 7.5 mg/dL — ABNORMAL HIGH (ref 2.5–4.6)
Phosphorus: 9.1 mg/dL — ABNORMAL HIGH (ref 2.5–4.6)
Potassium: 5.1 mmol/L (ref 3.5–5.1)
Potassium: 6 mmol/L — ABNORMAL HIGH (ref 3.5–5.1)
Sodium: 136 mmol/L (ref 135–145)
Sodium: 137 mmol/L (ref 135–145)

## 2018-08-03 LAB — GLUCOSE, CAPILLARY
Glucose-Capillary: 132 mg/dL — ABNORMAL HIGH (ref 70–99)
Glucose-Capillary: 136 mg/dL — ABNORMAL HIGH (ref 70–99)
Glucose-Capillary: 147 mg/dL — ABNORMAL HIGH (ref 70–99)
Glucose-Capillary: 160 mg/dL — ABNORMAL HIGH (ref 70–99)
Glucose-Capillary: 145 mg/dL — ABNORMAL HIGH (ref 70–99)
Glucose-Capillary: 172 mg/dL — ABNORMAL HIGH (ref 70–99)

## 2018-08-03 LAB — MAGNESIUM: Magnesium: 2.8 mg/dL — ABNORMAL HIGH (ref 1.7–2.4)

## 2018-08-03 LAB — APTT: aPTT: 31 seconds (ref 24–36)

## 2018-08-03 LAB — TRIGLYCERIDES: Triglycerides: 796 mg/dL — ABNORMAL HIGH (ref <150)

## 2018-08-03 LAB — BLOOD GAS, ARTERIAL

## 2018-08-03 MED ORDER — HEPARIN BOLUS VIA INFUSION (CRRT)
1000.0000 [IU] | INTRAVENOUS | Status: DC | PRN
  Filled 2018-08-03: qty 1000

## 2018-08-03 MED ORDER — DOCUSATE SODIUM 50 MG/5ML PO LIQD
100.0000 mg | Freq: Two times a day (BID) | ORAL | Status: DC | PRN
  Administered 2018-08-06 – 2018-08-08 (×2): 100 mg
  Filled 2018-08-03 (×2): qty 10

## 2018-08-03 MED ORDER — PIPERACILLIN-TAZOBACTAM IN DEX 2-0.25 GM/50ML IV SOLN
2.2500 g | Freq: Four times a day (QID) | INTRAVENOUS | Status: AC
  Administered 2018-08-03 – 2018-08-04 (×7): 2.25 g via INTRAVENOUS
  Filled 2018-08-03 (×8): qty 50

## 2018-08-03 MED ORDER — FENTANYL 2500MCG IN NS 250ML (10MCG/ML) PREMIX INFUSION
25.0000 ug/h | INTRAVENOUS | Status: DC
  Administered 2018-08-03: 18:00:00 400 ug/h via INTRAVENOUS
  Administered 2018-08-04: 175 ug/h via INTRAVENOUS
  Administered 2018-08-04: 13:00:00 375 ug/h via INTRAVENOUS
  Administered 2018-08-05: 275 ug/h via INTRAVENOUS
  Administered 2018-08-05: 07:00:00 200 ug/h via INTRAVENOUS
  Administered 2018-08-06 – 2018-08-07 (×4): 325 ug/h via INTRAVENOUS
  Administered 2018-08-08 (×3): 300 ug/h via INTRAVENOUS
  Administered 2018-08-09 (×2): 325 ug/h via INTRAVENOUS
  Administered 2018-08-10 (×2): 300 ug/h via INTRAVENOUS
  Administered 2018-08-10: 02:00:00 400 ug/h via INTRAVENOUS
  Administered 2018-08-11: 150 ug/h via INTRAVENOUS
  Administered 2018-08-11 – 2018-08-12 (×2): 300 ug/h via INTRAVENOUS
  Administered 2018-08-12: 05:00:00 250 ug/h via INTRAVENOUS
  Administered 2018-08-14: 200 ug/h via INTRAVENOUS
  Filled 2018-08-03 (×30): qty 250

## 2018-08-03 MED ORDER — SODIUM CHLORIDE 0.9 % IV SOLN
250.0000 [IU]/h | INTRAVENOUS | Status: DC
  Administered 2018-08-04 (×2): 1000 [IU]/h via INTRAVENOUS_CENTRAL
  Filled 2018-08-03 (×2): qty 2

## 2018-08-03 MED ORDER — HEPARIN SODIUM (PORCINE) 1000 UNIT/ML DIALYSIS: 1000.0000 [IU] | INTRAMUSCULAR | Status: DC | PRN

## 2018-08-03 MED ORDER — FENTANYL BOLUS VIA INFUSION
50.0000 ug | INTRAVENOUS | Status: DC | PRN
  Administered 2018-08-05 – 2018-08-16 (×5): 50 ug via INTRAVENOUS
  Filled 2018-08-03: qty 50

## 2018-08-03 MED ORDER — MIDAZOLAM BOLUS VIA INFUSION
1.0000 mg | INTRAVENOUS | Status: DC | PRN
  Administered 2018-08-06: 07:00:00 1 mg via INTRAVENOUS
  Administered 2018-08-11: 2 mg via INTRAVENOUS
  Administered 2018-08-12: 1 mg via INTRAVENOUS
  Administered 2018-08-13: 2 mg via INTRAVENOUS
  Filled 2018-08-03: qty 2

## 2018-08-03 MED ORDER — SODIUM CHLORIDE 0.9 % IV SOLN
0.0000 mg/h | INTRAVENOUS | Status: DC
  Administered 2018-08-03: 15:00:00 2 mg/h via INTRAVENOUS
  Administered 2018-08-04: 3 mg/h via INTRAVENOUS
  Filled 2018-08-03 (×3): qty 10

## 2018-08-03 NOTE — Progress Notes (Signed)
NAME:  [name] Long, MRN:  [id number], DOB:  Sep 19, 1965, LOS: 5 ADMISSION DATE:  08/11/2018, CONSULTATION DATE:  07/30/2018 REFERRING MD:  07/30/2018, CHIEF COMPLAINT:   Brief History   53 y/o male admitted on 4/1 with abdominal pain which had been persistent for several days.  COVID POSITIVE.  PCCM consulted 4/2 for worsening hypoxemia.   He was noted to be COVID positive by a PCP check earlier in the week.  He went to [facility] with abdominal pain, noted to have diverticulitis, was trasnferred to [facility] for further evaluation.    Past Medical History  DM2, HTN, Former smoker, OSA  [place] [facility] Events   4/01 admission for diverticulitis 4/02 PCCM consulted for worsening dyspnea, hypoxemia 4/02 Proned  4/03 Proned  4/05 Improved oxygenation however ABG with respiratory and metabolic acidosis. Poor UOP 4/06 Supine  Consults:  4/2 PCCM  Procedures:  4/2 ETT >   Significant Diagnostic Tests:    Micro Data:  3/30 COVID 19 test positive (outside PCP)  Antimicrobials:  Zosyn 4/1 >>   Interim history/subjective:  RN reports CVVHD continues, goal net even. Afebrile.  Supine.   Objective   Blood pressure 130/71, pulse 94, temperature (!) 96.1 F (35.6 C), temperature source Oral, resp. rate (!) 35, height 5\' 10"  (1.778 m), weight (!) 138.7 kg, SpO2 95 %.    Vent Mode: PRVC FiO2 (%):  [50 %] 50 % Set Rate:  [35 bmp] 35 bmp Vt Set:  [510 mL] 510 mL PEEP:  [15 cmH20] 15 cmH20 Plateau Pressure:  [29 cmH20-34 cmH20] 29 cmH20   Intake/Output Summary (Last 24 hours) at 08/03/2018 1031 Last data filed at 08/03/2018 0254 Gross per 24 hour  Intake 1416.4 ml  Output 1250 ml  Net 166.4 ml   Filed Weights   08/01/18 0600 08/02/18 0423 08/03/18 0412  Weight: (!) 137 kg (!) 141.4 kg (!) 138.7 kg   Physical Exam: General: critically ill appearing adult male lying in bed on vent  HEENT: MM pink/moist, ETT, thick yellow nasal discharge  Neuro: sedate  CV: s1s2 rrr, no m/r/g  PULM: even/non-labored, lungs bilaterally coarse  YH:CWCB, non-tender, bsx4 active  Extremities: warm/dry, trace generalized edema  Skin: no rashes or lesions  Resolved Hospital Problem list     Assessment & Plan:   Diverticulitis P: Continue zosyn, D6/7 (end 4/7)  Acute respiratory failure with hypoxemia  -paralytics + prone 4/2-4/5 P: ARDS protocol for low Vt ventilation  Change sedation to versed + fentanyl given hypertriglyceridemia  ABG now and in am  Follow PaFiO2 ratio, consider prone positioning if <150 Volume removal per CRRT RASS -2 to -3  SARS COV Pneumonia P: Continue plaquenil through 4/6  Azithro to stop 4/6  Not a candidate for tocilizumab with diverticulitis  AKI with metabolic acidosis  P: Appreciate Nephrology input  Continue CVVHD  Trend BMP / urinary output Replace electrolytes as indicated Avoid nephrotoxic agents, ensure adequate renal perfusion  DM2 P: SSI   OSA P: No intervention at this time, intubated Not likely to get NIV QHS given COVID positive status while inpatient    Best practice:  Diet:  tube feeding Pain/Anxiety/Delirium protocol (if indicated): yes, RASS -2 to -3 VAP protocol (if indicated): yes DVT prophylaxis: sub q heparin GI prophylaxis: pantoprazole Glucose control: SSI Mobility: bed rest Code Status: full Family Communication: Wife Butch Penny) updated via phone 4/6.  Reviewed concept of CPR in the event of decline / arrest and would not likely be in his best  interest.  Continue full code for now.  Disposition: ICU  Labs   CBC: Recent Labs  Lab 07/30/18 0247 08/01/18 0430  WBC 2.5* 3.0*  HGB 16.5 14.8  HCT 51.0 47.6  MCV 88.5 94.3  PLT 70* 123*    Basic Metabolic Panel: Recent Labs  Lab 07/30/18 1845 07/31/18 0333 08/01/18 0430 08/02/18 0400 08/02/18 1713 08/03/18 0417  NA  --  139 140 137 138 136  K  --  4.1 4.4 5.3* 5.3* 5.1  CL  --  104 106 105 104 103  CO2  --  23 22 19* 18* 16*  GLUCOSE   --  155* 122* 162* 125* 140*  BUN  --  31* 54* 77* 86* 68*  CREATININE  --  2.27* 3.64* 6.02* 7.21* 5.95*  CALCIUM  --  7.9* 7.6* 7.6* 7.5* 7.9*  MG 2.1 2.5* 2.5*  --   --  2.8*  PHOS 5.6* 6.6* 6.3*  --  9.3* 7.5*   GFR: Estimated Creatinine Clearance: 20.4 mL/min (A) (by C-G formula based on SCr of 5.95 mg/dL (H)). Recent Labs  Lab 07/30/18 0247 08/01/18 0430  WBC 2.5* 3.0*    Liver Function Tests: Recent Labs  Lab 07/30/18 0247 07/30/18 1845 08/02/18 1713 08/03/18 0417  AST 61* 60*  --   --   ALT 36 34  --   --   ALKPHOS 56 57  --   --   BILITOT 1.2 1.2  --   --   PROT 6.8 6.3*  --   --   ALBUMIN 3.2* 2.9* 2.2* 2.6*   No results for input(s): LIPASE, AMYLASE in the last 168 hours. No results for input(s): AMMONIA in the last 168 hours.  ABG    Component Value Date/Time   PHART 7.183 (LL) 08/02/2018 1630   PCO2ART 51.6 (H) 08/02/2018 1630   PO2ART 114 (H) 08/02/2018 1630   HCO3 18.7 (L) 08/02/2018 1630   ACIDBASEDEF 10.0 (H) 08/02/2018 1630   O2SAT 97.4 08/02/2018 1630     Coagulation Profile: No results for input(s): INR, PROTIME in the last 168 hours.  Cardiac Enzymes: No results for input(s): CKTOTAL, CKMB, CKMBINDEX, TROPONINI in the last 168 hours.  HbA1C: No results found for: HGBA1C  CBG: Recent Labs  Lab 08/02/18 1146 08/02/18 1849 08/02/18 2258 08/03/18 0359 08/03/18 0806  GLUCAP 152* 132* 126* 136* 147*    Critical care time: 40 minutes     Noe Gens, NP-C [facility] [facility] Pgr: [phone] or if no answer [phone] 08/03/2018, 10:31 AM

## 2018-08-03 NOTE — Progress Notes (Addendum)
Pharmacy Antibiotic Note  [name] Long is a 53 y.o. male with diverticulitis of colon with perforation and positive COVID-19 currently on zosyn for intra-abdominal infection.  Today, 08/03/2018: - Tmax 100.2, wbc low on 4/4 - CRRT started 4/5 PM  Plan: - change zosyn to 2.25 gm IV q6h (infuse over 30 min) for CRRT - last dose 4/7 PM - monitor renal function  __________________________________________  Height: 5\' 10"  (177.8 cm) Weight: (!) 305 lb 12.5 oz (138.7 kg) IBW/kg (Calculated) : 73  Temp (24hrs), Avg:98.2 F (36.8 C), Min:95.9 F (35.5 C), Max:100.3 F (37.9 C)  Recent Labs  Lab 07/30/18 0247 07/31/18 0333 08/01/18 0430 08/02/18 0400 08/02/18 1713 08/03/18 0417  WBC 2.5*  --  3.0*  --   --   --   CREATININE 1.22 2.27* 3.64* 6.02* 7.21* 5.95*    Estimated Creatinine Clearance: 20.4 mL/min (A) (by C-G formula based on SCr of 5.95 mg/dL (H)).    No Known Allergies  Antimicrobials this admission: 4/2 zosyn >>  4/2 azithromycin >> 4/2  HCQ >> (4/6)  Dose adjustments this admission: --  Microbiology results: COVID + per H and P note on 4/1 while outpt   4/2 MRSA PCR: negative  4/2 hepatitis panel: neg   Thank you for allowing pharmacy to be a part of this patient's care.  Peggyann Juba, PharmD, BCPS Pager: [phone] 08/03/2018 7:01 AM

## 2018-08-03 NOTE — Progress Notes (Signed)
Received call from ICU nurse for frequent CRRT filter clotting. Chart reviewed. -started IV heparin with close monitoring of ACT. -watch for any sign of bleeding and monitor cbc.

## 2018-08-03 NOTE — Progress Notes (Signed)
[facility] ROUNDING NOTE   Subjective:   This is a 53 year old gentleman diabetes hypertension obstructive sleep apnea on CPAP.  Admitted with  COVID-19.  He has been treated by CCM with Plaquenil azithromycin.  CRRT was initiated 08/02/2018 past be tolerating this well  Blood pressure 134/71 pulse 96 temperature 96.1 O2 sats 93% FiO2 50%  Chest x-ray showed improved aeration of the right lung with persistent pneumonia in the left lung 08/02/2018   CT abd from [facility] on 4/1 - R kidney visualized, no hydro noted, probably there is dye in the renal pelvis  Sodium 136 potassium 5.1 chloride 103 CO2 16 BUN 68 creatinine 5.95 glucose 140 calcium 7.9 phosphorus 7.5 magnesium 2.8 albumin 2.6.  Urinalysis showed 11-20 WBCs per high-powered film.  Urine sodium 17.  pH 7.18 PCO2 51 PO2 114 O2 sats 97.4  Zithromax 500 mg daily, Plaquenil 200 mg daily, Zosyn 2.25 mg every 6 hours, amlodipine 10 mg daily, Protonix 40 mg daily   Objective:  Vital signs in last 24 hours:  Temp:  [95.9 F (35.5 C)-100.2 F (37.9 C)] 96.1 F (35.6 C) (04/06 0800) Pulse Rate:  [84-108] 94 (04/06 0903) Resp:  [0-35] 35 (04/06 0903) BP: (111-148)/(50-97) 130/71 (04/06 0903) SpO2:  [93 %-98 %] 95 % (04/06 0903) Arterial Line BP: (93-133)/(52-72) 131/71 (04/06 0800) FiO2 (%):  [50 %] 50 % (04/06 0903) Weight:  [138.7 kg] 138.7 kg (04/06 0412)  Weight change: -2.7 kg Filed Weights   08/01/18 0600 08/02/18 0423 08/03/18 0412  Weight: (!) 137 kg (!) 141.4 kg (!) 138.7 kg    Intake/Output: I/O last 3 completed shifts: In: 2093 [I.V.:1896.3; NG/GT:90; IV Piggyback:106.7] Out: 3559 [Urine:435; Other:1077]   Intake/Output this shift:  Total I/O In: 83.9 [I.V.:63.9; NG/GT:20] Out: 138 [Other:138]  Patient not physically examined in room being positive for covid 19   Basic Metabolic Panel: Recent Labs  Lab 07/30/18 1845 07/31/18 0333 08/01/18 0430 08/02/18 0400 08/02/18 1713 08/03/18 0417  NA   --  139 140 137 138 136  K  --  4.1 4.4 5.3* 5.3* 5.1  CL  --  104 106 105 104 103  CO2  --  23 22 19* 18* 16*  GLUCOSE  --  155* 122* 162* 125* 140*  BUN  --  31* 54* 77* 86* 68*  CREATININE  --  2.27* 3.64* 6.02* 7.21* 5.95*  CALCIUM  --  7.9* 7.6* 7.6* 7.5* 7.9*  MG 2.1 2.5* 2.5*  --   --  2.8*  PHOS 5.6* 6.6* 6.3*  --  9.3* 7.5*    Liver Function Tests: Recent Labs  Lab 07/30/18 0247 07/30/18 1845 08/02/18 1713 08/03/18 0417  AST 61* 60*  --   --   ALT 36 34  --   --   ALKPHOS 56 57  --   --   BILITOT 1.2 1.2  --   --   PROT 6.8 6.3*  --   --   ALBUMIN 3.2* 2.9* 2.2* 2.6*   No results for input(s): LIPASE, AMYLASE in the last 168 hours. No results for input(s): AMMONIA in the last 168 hours.  CBC: Recent Labs  Lab 07/30/18 0247 08/01/18 0430  WBC 2.5* 3.0*  HGB 16.5 14.8  HCT 51.0 47.6  MCV 88.5 94.3  PLT 70* 123*    Cardiac Enzymes: No results for input(s): CKTOTAL, CKMB, CKMBINDEX, TROPONINI in the last 168 hours.  BNP: Invalid input(s): POCBNP  CBG: Recent Labs  Lab 08/02/18 1146  08/02/18 1849 08/02/18 2258 08/03/18 0359 08/03/18 0806  GLUCAP 152* 132* 126* 136* 147*    Microbiology: Results for orders placed or performed during the hospital encounter of 08/06/2018  MRSA PCR Screening     Status: None   Collection Time: 07/30/18  2:30 AM  Result Value Ref Range Status   MRSA by PCR NEGATIVE NEGATIVE Final    Comment:        The GeneXpert MRSA Assay (FDA approved for NASAL specimens only), is one component of a comprehensive MRSA colonization surveillance program. It is not intended to diagnose MRSA infection nor to guide or monitor treatment for MRSA infections. Performed at [facility], [place] [address] [place], [place] [address]     Coagulation Studies: No results for input(s): LABPROT, INR in the last 72 hours.  Urinalysis: Recent Labs    08/02/18 1837  COLORURINE AMBER*  LABSPEC 1.021  PHURINE 5.0   GLUCOSEU NEGATIVE  HGBUR SMALL*  BILIRUBINUR NEGATIVE  KETONESUR NEGATIVE  PROTEINUR 30*  NITRITE NEGATIVE  LEUKOCYTESUR NEGATIVE      Imaging: Dg Chest Port 1 View  Result Date: 08/02/2018 CLINICAL DATA:  Central line placement. EXAM: PORTABLE CHEST 1 VIEW COMPARISON:  07/30/2018 FINDINGS: Left internal jugular central line tip is in the SVC at the azygos level. Endotracheal tube tip 4 cm above the carina. Orogastric or nasogastric tube enters the abdomen. Right lung appears improved with less edema. There is persistent and possibly worsened pneumonia on the left, particularly in the lower lobe. IMPRESSION: Left internal jugular central line tip in the SVC at the azygos level. No pneumothorax. Endotracheal tube and nasogastric or orogastric tube satisfactory. Improved aeration of the right lung. Persistent and possibly worsened pneumonia on the left. Electronically Signed   By: Nelson Chimes M.D.   On: 08/02/2018 16:46     Medications:   .  prismasol BGK 4/2.5 400 mL/hr at 08/03/18 6045  .  prismasol BGK 4/2.5    . sodium chloride    . fentaNYL infusion INTRAVENOUS 400 mcg/hr (08/03/18 0700)  . piperacillin-tazobactam (ZOSYN)  IV    . prismasol BGK 4/2.5 2,000 mL/hr at 08/03/18 0651  . propofol (DIPRIVAN) infusion 30 mcg/kg/min (08/03/18 0700)  . vecuronium (NORCURON) infusion Stopped (08/02/18 1612)   . amLODipine  10 mg Per Tube Daily  . artificial tears  1 application Both Eyes W0J  . chlorhexidine gluconate (MEDLINE KIT)  15 mL Mouth Rinse BID  . Chlorhexidine Gluconate Cloth  6 each Topical Daily  . Chlorhexidine Gluconate Cloth  6 each Topical Daily  . enoxaparin (LOVENOX) injection  40 mg Subcutaneous QHS  . heparin  2,000 Units Intracatheter Once  . heparin  2,000 Units Intracatheter Once  . insulin aspart  0-9 Units Subcutaneous Q4H  . mouth rinse  15 mL Mouth Rinse 10 times per day  . pantoprazole (PROTONIX) IV  40 mg Intravenous Q24H  . sodium chloride flush  10-40  mL Intracatheter Q12H   Place/Maintain arterial line **AND** sodium chloride, acetaminophen (TYLENOL) oral liquid 160 mg/5 mL, albuterol, alteplase, fentaNYL, fentaNYL (SUBLIMAZE) injection, fentaNYL (SUBLIMAZE) injection, fentaNYL (SUBLIMAZE) injection, heparin, hydrALAZINE, lip balm, midazolam, ondansetron **OR** ondansetron (ZOFRAN) IV, sodium chloride, sodium chloride flush  Assessment/ Plan:   Acute respiratory failure.  Suspect ATN possibly contrast related.  Worsening respiratory acidosis and metabolic acidosis CRRT initiated  Pneumonia secondary to COVID-19 on ventilator with high support  Diabetes mellitus per primary team  Obesity and obstructive sleep apnea  History of  atrial fibrillation  Electrolytes appear to be stable  Acid-base.  Combined metabolic and respiratory acidosis  Volume continues to remove 50 cc an hour as tolerated     LOS: [place] '@TODAY' '@11' :00 AM

## 2018-08-03 NOTE — Progress Notes (Signed)
Filter clotted at approximately 1950. Day-shift RN had just replaced the filter at approximately 1800. Filter removed & replaced & dialysis ports flushed repeatedly w/ 32mL syringes of NS. Called MD for heparin orders to infuse through CRRT. While gathering I-stat & ACT supplies, attempted to begin pt again on CRRT to get the machine running. Heparin syringe arrived while starting up the machine. Before heparin syringe was added to CRRT, clots were discovered in the pt's dialysis port. It was also noted that the pt's SBP dropped from 120's to 60's when CRRT was restarted. CRRT stopped- SBP returned to 120's w/in 20mins. Ports were worked on [employer] used to pull back to remove possible clots. It was noted that the blue port had a larger clot removed vs the red port. New filter exchanged. Alteplase was instilled in both ports at approximately 2210. Per Alteplase orders, will remove from ports 2hrs later (@0010 ) and begin CRRT.  Vital signs currently stable & pt resting on sedation.

## 2018-08-04 LAB — GLUCOSE, CAPILLARY
Glucose-Capillary: 120 mg/dL — ABNORMAL HIGH (ref 70–99)
Glucose-Capillary: 130 mg/dL — ABNORMAL HIGH (ref 70–99)
Glucose-Capillary: 175 mg/dL — ABNORMAL HIGH (ref 70–99)
Glucose-Capillary: 203 mg/dL — ABNORMAL HIGH (ref 70–99)
Glucose-Capillary: 225 mg/dL — ABNORMAL HIGH (ref 70–99)

## 2018-08-04 LAB — RENAL FUNCTION PANEL
Albumin: 2.2 g/dL — ABNORMAL LOW (ref 3.5–5.0)
Albumin: 2.5 g/dL — ABNORMAL LOW (ref 3.5–5.0)
Anion gap: 15 (ref 5–15)
Anion gap: 14 (ref 5–15)
BUN: 64 mg/dL — ABNORMAL HIGH (ref 6–20)
BUN: 58 mg/dL — ABNORMAL HIGH (ref 6–20)
CO2: 21 mmol/L — ABNORMAL LOW (ref 22–32)
CO2: 21 mmol/L — ABNORMAL LOW (ref 22–32)
Calcium: 7.7 mg/dL — ABNORMAL LOW (ref 8.9–10.3)
Calcium: 7.8 mg/dL — ABNORMAL LOW (ref 8.9–10.3)
Chloride: 101 mmol/L (ref 98–111)
Chloride: 104 mmol/L (ref 98–111)
Creatinine, Ser: 5.51 mg/dL — ABNORMAL HIGH (ref 0.61–1.24)
Creatinine, Ser: 4.28 mg/dL — ABNORMAL HIGH (ref 0.61–1.24)
GFR calc Af Amer: 13 mL/min — ABNORMAL LOW (ref >60)
GFR calc Af Amer: 17 mL/min — ABNORMAL LOW (ref >60)
GFR calc non Af Amer: 11 mL/min — ABNORMAL LOW (ref >60)
GFR calc non Af Amer: 15 mL/min — ABNORMAL LOW (ref >60)
Glucose, Bld: 220 mg/dL — ABNORMAL HIGH (ref 70–99)
Glucose, Bld: 130 mg/dL — ABNORMAL HIGH (ref 70–99)
Phosphorus: 8.6 mg/dL — ABNORMAL HIGH (ref 2.5–4.6)
Phosphorus: 6.2 mg/dL — ABNORMAL HIGH (ref 2.5–4.6)
Potassium: 6.2 mmol/L — ABNORMAL HIGH (ref 3.5–5.1)
Potassium: 4.8 mmol/L (ref 3.5–5.1)
Sodium: 137 mmol/L (ref 135–145)
Sodium: 139 mmol/L (ref 135–145)

## 2018-08-04 LAB — POCT ACTIVATED CLOTTING TIME
Activated Clotting Time: 131 seconds
Activated Clotting Time: 147 seconds
Activated Clotting Time: 153 seconds
Activated Clotting Time: 158 seconds
Activated Clotting Time: 164 seconds
Activated Clotting Time: 169 seconds
Activated Clotting Time: 169 seconds
Activated Clotting Time: 175 seconds
Activated Clotting Time: 175 seconds
Activated Clotting Time: 164 seconds
Activated Clotting Time: 169 seconds
Activated Clotting Time: 169 seconds
Activated Clotting Time: 169 seconds
Activated Clotting Time: 175 seconds
Activated Clotting Time: 180 seconds
Activated Clotting Time: 180 seconds
Activated Clotting Time: 180 seconds
Activated Clotting Time: 180 seconds
Activated Clotting Time: 191 seconds
Activated Clotting Time: 191 seconds

## 2018-08-04 LAB — CBC
HCT: 43.8 % (ref 39.0–52.0)
Hemoglobin: 13.3 g/dL (ref 13.0–17.0)
MCH: 28.9 pg (ref 26.0–34.0)
MCHC: 30.4 g/dL (ref 30.0–36.0)
MCV: 95 fL (ref 80.0–100.0)
Platelets: 216 10*3/uL (ref 150–400)
RBC: 4.61 MIL/uL (ref 4.22–5.81)
RDW: 17.4 % — ABNORMAL HIGH (ref 11.5–15.5)
WBC: 11.9 10*3/uL — ABNORMAL HIGH (ref 4.0–10.5)
nRBC: 0.5 % — ABNORMAL HIGH (ref 0.0–0.2)

## 2018-08-04 LAB — BLOOD GAS, ARTERIAL
Acid-base deficit: 8.3 mmol/L — ABNORMAL HIGH (ref 0.0–2.0)
Acid-base deficit: 4.9 mmol/L — ABNORMAL HIGH (ref 0.0–2.0)
Bicarbonate: 20.9 mmol/L (ref 20.0–28.0)
Bicarbonate: 22.8 mmol/L (ref 20.0–28.0)
Drawn by: 4224641
Drawn by: 257881
FIO2: 45
FIO2: 45
MECHVT: 510 mL
MECHVT: 510 mL
O2 Saturation: 94.4 %
O2 Saturation: 94.1 %
PEEP: 15 cmH2O
PEEP: 8 cmH2O
Patient temperature: 96.8
Patient temperature: 98.6
RATE: 35 resp/min
RATE: 35 resp/min
pCO2 arterial: 57.4 mmHg — ABNORMAL HIGH (ref 32.0–48.0)
pCO2 arterial: 55.7 mmHg — ABNORMAL HIGH (ref 32.0–48.0)
pH, Arterial: 7.178 — CL (ref 7.350–7.450)
pH, Arterial: 7.236 — ABNORMAL LOW (ref 7.350–7.450)
pO2, Arterial: 70.1 mmHg — ABNORMAL LOW (ref 83.0–108.0)
pO2, Arterial: 73.6 mmHg — ABNORMAL LOW (ref 83.0–108.0)

## 2018-08-04 LAB — MAGNESIUM: Magnesium: 2.9 mg/dL — ABNORMAL HIGH (ref 1.7–2.4)

## 2018-08-04 LAB — APTT: aPTT: 35 seconds (ref 24–36)

## 2018-08-04 MED ORDER — OXYCODONE HCL 5 MG PO TABS
5.0000 mg | ORAL_TABLET | ORAL | Status: DC
  Administered 2018-08-04 – 2018-08-10 (×30): 5 mg
  Filled 2018-08-04 (×32): qty 1

## 2018-08-04 MED ORDER — ALTEPLASE 2 MG IJ SOLR
2.0000 mg | Freq: Once | INTRAMUSCULAR | Status: AC
  Administered 2018-08-04: 14:00:00 2 mg
  Filled 2018-08-04: qty 2

## 2018-08-04 MED ORDER — PRISMASOL BGK 0/2.5 32-2.5 MEQ/L REPLACEMENT SOLN
Status: DC
  Administered 2018-08-04 (×2): via INTRAVENOUS_CENTRAL
  Filled 2018-08-04 (×4): qty 5000

## 2018-08-04 MED ORDER — SODIUM BICARBONATE 8.4 % IV SOLN
100.0000 meq | Freq: Once | INTRAVENOUS | Status: AC
  Administered 2018-08-04: 06:00:00 100 meq via INTRAVENOUS

## 2018-08-04 MED ORDER — PRISMASOL BGK 0/2.5 32-2.5 MEQ/L IV SOLN
INTRAVENOUS | Status: DC
  Administered 2018-08-04 – 2018-08-05 (×8): via INTRAVENOUS_CENTRAL
  Filled 2018-08-04 (×15): qty 5000

## 2018-08-04 MED ORDER — HEPARIN BOLUS VIA INFUSION (CRRT)
1000.0000 [IU] | INTRAVENOUS | Status: DC | PRN
  Administered 2018-08-06 – 2018-08-07 (×3): 1000 [IU] via INTRAVENOUS_CENTRAL
  Filled 2018-08-04 (×2): qty 1000

## 2018-08-04 MED ORDER — SODIUM BICARBONATE 8.4 % IV SOLN
INTRAVENOUS | Status: AC
  Filled 2018-08-04: qty 100

## 2018-08-04 MED ORDER — SODIUM CHLORIDE 0.9 % FOR CRRT
INTRAVENOUS_CENTRAL | Status: DC | PRN
  Filled 2018-08-04: qty 1000

## 2018-08-04 MED ORDER — PHENYLEPHRINE HCL-NACL 10-0.9 MG/250ML-% IV SOLN
0.0000 ug/min | INTRAVENOUS | Status: DC
  Administered 2018-08-04 (×2): 20 ug/min via INTRAVENOUS
  Filled 2018-08-04 (×3): qty 250

## 2018-08-04 MED ORDER — MIDAZOLAM 50MG/50ML (1MG/ML) PREMIX INFUSION
0.0000 mg/h | INTRAVENOUS | Status: DC
  Administered 2018-08-04: 3 mg/h via INTRAVENOUS
  Administered 2018-08-05: 14:00:00 5 mg/h via INTRAVENOUS
  Administered 2018-08-05 – 2018-08-06 (×3): 6 mg/h via INTRAVENOUS
  Administered 2018-08-07: 5 mg/h via INTRAVENOUS
  Administered 2018-08-10 (×2): 4 mg/h via INTRAVENOUS
  Administered 2018-08-10 (×2): 2 mg/h via INTRAVENOUS
  Administered 2018-08-11 – 2018-08-14 (×4): 3 mg/h via INTRAVENOUS
  Filled 2018-08-04 (×14): qty 50

## 2018-08-04 MED ORDER — HEPARIN SODIUM (PORCINE) 1000 UNIT/ML DIALYSIS
1000.0000 [IU] | INTRAMUSCULAR | Status: DC | PRN
  Administered 2018-08-06: 3000 [IU] via INTRAVENOUS_CENTRAL
  Administered 2018-08-07: 1500 [IU] via INTRAVENOUS_CENTRAL
  Administered 2018-08-08: 3000 [IU] via INTRAVENOUS_CENTRAL
  Filled 2018-08-04: qty 6
  Filled 2018-08-04: qty 3
  Filled 2018-08-04: qty 6
  Filled 2018-08-04: qty 4
  Filled 2018-08-04: qty 6
  Filled 2018-08-04: qty 3
  Filled 2018-08-04: qty 6

## 2018-08-04 MED ORDER — SODIUM CHLORIDE 0.9 % IV SOLN
250.0000 [IU]/h | INTRAVENOUS | Status: DC
  Administered 2018-08-04: 10:00:00 1100 [IU]/h via INTRAVENOUS_CENTRAL
  Administered 2018-08-04: 1800 [IU]/h via INTRAVENOUS_CENTRAL
  Administered 2018-08-05: 2100 [IU]/h via INTRAVENOUS_CENTRAL
  Administered 2018-08-05: 01:00:00 1800 [IU]/h via INTRAVENOUS_CENTRAL
  Administered 2018-08-07: 03:00:00 1550 [IU]/h via INTRAVENOUS_CENTRAL
  Administered 2018-08-07: 22:00:00 1950 [IU]/h via INTRAVENOUS_CENTRAL
  Administered 2018-08-07: 09:00:00 1650 [IU]/h via INTRAVENOUS_CENTRAL
  Administered 2018-08-07: 17:00:00 1700 [IU]/h via INTRAVENOUS_CENTRAL
  Administered 2018-08-08: 1850 [IU]/h via INTRAVENOUS_CENTRAL
  Filled 2018-08-04 (×17): qty 2
  Filled 2018-08-04: qty 10000

## 2018-08-04 NOTE — Progress Notes (Signed)
eLink Physician-Brief Progress Note Patient Name: [name] Long DOB: 1966/02/28 MRN: [id number]   Date of Service  08/04/2018  HPI/Events of Note  BP soft = 95/54 with MAP = 66. Nursing going to restart CRRT and concerned about BP.   eICU Interventions  Will order: 1. Monitor CVP now and Q 4 hours.  2. Phenylephrine IV infusion. Titrate to MAP > 65.      Intervention Category Major Interventions: Hypotension - evaluation and management  [name] 08/04/2018, 12:17 AM

## 2018-08-04 NOTE — Progress Notes (Signed)
NAME:  [name] Long, MRN:  [id number], DOB:  09-05-1965, LOS: 6 ADMISSION DATE:  08/24/2018, CONSULTATION DATE:  07/30/2018 REFERRING MD:  07/30/2018, CHIEF COMPLAINT:   Brief History   53 y/o male admitted on 4/1 with abdominal pain which had been persistent for several days.  COVID POSITIVE.  PCCM consulted 4/2 for worsening hypoxemia.   He was noted to be COVID positive by a PCP check earlier in the week.  He went to [facility] with abdominal pain, noted to have diverticulitis, was trasnferred to [facility] for further evaluation.    Past Medical History  DM2, HTN, Former smoker, OSA  [facility] Events   4/01 admission for diverticulitis 4/02 PCCM consulted for worsening dyspnea, hypoxemia 4/02 Proned  4/03 Proned  4/05 Improved oxygenation however ABG with respiratory and metabolic acidosis. Poor UOP 4/06 Supine  Consults:  4/2 PCCM  Procedures:  4/2 ETT >   Significant Diagnostic Tests:    Micro Data:  3/30 COVID 19 test positive (outside PCP)  Antimicrobials:  Zosyn 4/1 >>   Interim history/subjective:  CVVHD filter clotted multiple times overnight > pt essentially did not get HD. Back on neosynephrine.   Objective   Blood pressure (!) 122/59, pulse (!) 108, temperature (!) 96.8 F (36 C), temperature source Axillary, resp. rate (!) 31, height 5\' 10"  (1.778 m), weight (!) 140.2 kg, SpO2 96 %. CVP:  [6 mmHg-8 mmHg] 8 mmHg  Vent Mode: PRVC FiO2 (%):  [40 %-50 %] 50 % Set Rate:  [35 bmp] 35 bmp Vt Set:  [510 mL] 510 mL PEEP:  [15 cmH20] 15 cmH20 Plateau Pressure:  [19 cmH20-32 cmH20] 32 cmH20   Intake/Output Summary (Last 24 hours) at 08/04/2018 1453 Last data filed at 08/04/2018 1400 Gross per 24 hour  Intake 1499.53 ml  Output 1404 ml  Net 95.53 ml   Filed Weights   08/02/18 0423 08/03/18 0412 08/04/18 0437  Weight: (!) 141.4 kg (!) 138.7 kg (!) 140.2 kg   Physical Exam: General: adult male lying in bed, critically ill appearing HEENT: MM  pink/moist, ETT  Neuro: sedate  CV: s1s2 rrr, no m/r/g PULM: even/non-labored, lungs bilaterally coarse  IP:JASN, non-tender, bsx4 active  Extremities: warm/dry, generalized 1-2+  edema  Skin: no rashes or lesions  Resolved Hospital Problem list     Assessment & Plan:   Diverticulitis P: Completed abx 4/7   Acute respiratory failure with hypoxemia  -paralytics + prone 4/2-4/5 Mixed Acidosis  P: ARDS protocol for low Vt ventilation, 7cc / rate 35  Continue fentanyl + versed Add oxycodone 5mg  PT Q4  Reduce fentanyl as able  Continue versed gtt  Volume removal per CRRT   SARS COV Pneumonia P: Plaquenil completed  ABX completed  Not a candidate for tocilizumab with diverticulitis   AKI with metabolic acidosis  P: Appreciate Nephrology  CVVHD ongoing  Trend BMP / urinary output Replace electrolytes as indicated Avoid nephrotoxic agents, ensure adequate renal perfusion  DM2 P: SSI   OSA P: Monitor / intubated   Best practice:  Diet:  tube feeding Pain/Anxiety/Delirium protocol (if indicated): yes, RASS -2 to -3 VAP protocol (if indicated): yes DVT prophylaxis: sub q heparin GI prophylaxis: pantoprazole Glucose control: SSI Mobility: bed rest Code Status: full Family Communication: Butch Penny updated 4/7 via phone.  Limited code blue established / no CPR.  We also reviewed that if no significant improvement in clinical status by 4/8, may need to consider palliative extubation.  Disposition: ICU  Labs  CBC: Recent Labs  Lab 07/30/18 0247 08/01/18 0430 08/04/18 0431  WBC 2.5* 3.0* 11.9*  HGB 16.5 14.8 13.3  HCT 51.0 47.6 43.8  MCV 88.5 94.3 95.0  PLT 70* 123* 202    Basic Metabolic Panel: Recent Labs  Lab 07/30/18 1845 07/31/18 0333 08/01/18 0430 08/02/18 0400 08/02/18 1713 08/03/18 0417 08/03/18 2007 08/04/18 0431  NA  --  139 140 137 138 136 137 137  K  --  4.1 4.4 5.3* 5.3* 5.1 6.0* 6.2*  CL  --  104 106 105 104 103 101 101  CO2  --  23  22 19* 18* 16* 20* 21*  GLUCOSE  --  155* 122* 162* 125* 140* 196* 220*  BUN  --  31* 54* 77* 86* 68* 65* 64*  CREATININE  --  2.27* 3.64* 6.02* 7.21* 5.95* 5.60* 5.51*  CALCIUM  --  7.9* 7.6* 7.6* 7.5* 7.9* 8.0* 7.7*  MG 2.1 2.5* 2.5*  --   --  2.8*  --  2.9*  PHOS 5.6* 6.6* 6.3*  --  9.3* 7.5* 9.1* 8.6*   GFR: Estimated Creatinine Clearance: 22.2 mL/min (A) (by C-G formula based on SCr of 5.51 mg/dL (H)). Recent Labs  Lab 07/30/18 0247 08/01/18 0430 08/04/18 0431  WBC 2.5* 3.0* 11.9*    Liver Function Tests: Recent Labs  Lab 07/30/18 0247 07/30/18 1845 08/02/18 1713 08/03/18 0417 08/03/18 2007 08/04/18 0431  AST 61* 60*  --   --   --   --   ALT 36 34  --   --   --   --   ALKPHOS 56 57  --   --   --   --   BILITOT 1.2 1.2  --   --   --   --   PROT 6.8 6.3*  --   --   --   --   ALBUMIN 3.2* 2.9* 2.2* 2.6* 2.4* 2.2*   No results for input(s): LIPASE, AMYLASE in the last 168 hours. No results for input(s): AMMONIA in the last 168 hours.  ABG    Component Value Date/Time   PHART 7.236 (L) 08/04/2018 0750   PCO2ART 55.7 (H) 08/04/2018 0750   PO2ART 73.6 (L) 08/04/2018 0750   HCO3 22.8 08/04/2018 0750   ACIDBASEDEF 4.9 (H) 08/04/2018 0750   O2SAT 94.1 08/04/2018 0750     Coagulation Profile: No results for input(s): INR, PROTIME in the last 168 hours.  Cardiac Enzymes: No results for input(s): CKTOTAL, CKMB, CKMBINDEX, TROPONINI in the last 168 hours.  HbA1C: No results found for: HGBA1C  CBG: Recent Labs  Lab 08/03/18 2014 08/04/18 0011 08/04/18 0409 08/04/18 0834 08/04/18 1149  GLUCAP 172* 225* 203* 175* 130*    Critical care time: 19 minutes     Noe Gens, NP-C [facility] Pgr: [phone] or if no answer [phone] 08/04/2018, 2:53 PM

## 2018-08-04 NOTE — Progress Notes (Signed)
[facility] Progress Note Patient Name: [name] Long DOB: July 25, 1965 MRN: [id number]   Date of Service  08/04/2018  HPI/Events of Note  ABG on 45%/PRVC 35/TV 510/P 15 = 7.17/57/70/20. No room to increase PRVC rate or TV.  eICU Interventions  Will order: 1. NaHCO3 100 meq IV now. 2. Repeat ABG at 7:30 AM.     Intervention Category Major Interventions: Acid-Base disturbance - evaluation and management;Respiratory failure - evaluation and management  Sommer,Steven Eugene 08/04/2018, 5:27 AM

## 2018-08-04 NOTE — Progress Notes (Signed)
[facility] ROUNDING NOTE   Subjective:   This is a 53 year old gentleman diabetes hypertension obstructive sleep apnea on CPAP.  Admitted with  COVID-19.  He has been treated by CCM with Plaquenil azithromycin.  CRRT was initiated 08/02/2018.  Has had some clotting of the filter and required initiation of heparin with dialysis 08/03/2018.  He has been kept even  Blood pressure 126/66 pulse 90 temperature 96.8 O2 sats 96% 45% FiO2  Chest x-ray showed improved aeration of the right lung with persistent pneumonia in the left lung 08/02/2018   CT abd from [facility] on 4/1 - R kidney visualized, no hydro noted, probably there is dye in the renal pelvis  Sodium 137 potassium 6.2 chloride 101 CO2 21 BUN 64 creatinine 5.51 glucose 220 phosphorus 8.6 magnesium 2.9 albumin 2.2 calcium 7.7  pH 7.236 PO2 73.6 PCO2 55.7  Zithromax 500 mg daily, , Zosyn 2.25 mg every 6 hours, amlodipine 10 mg daily, Protonix 40 mg daily, phenylephrine  Objective:  Vital signs in last 24 hours:  Temp:  [96.8 F (36 C)-98.4 F (36.9 C)] 96.8 F (36 C) (04/07 0400) Pulse Rate:  [79-115] 88 (04/07 0915) Resp:  [17-37] 35 (04/07 0915) BP: (116-173)/(38-82) 147/77 (04/07 0800) SpO2:  [92 %-98 %] 96 % (04/07 0915) Arterial Line BP: (100-143)/(53-77) 127/66 (04/07 0915) FiO2 (%):  [45 %] 45 % (04/07 0759) Weight:  [140.2 kg] 140.2 kg (04/07 0437)  Weight change: 1.5 kg Filed Weights   08/02/18 0423 08/03/18 0412 08/04/18 0437  Weight: (!) 141.4 kg (!) 138.7 kg (!) 140.2 kg    Intake/Output: I/O last 3 completed shifts: In: 2266.9 [I.V.:1851.9; NG/GT:110; IV Piggyback:305] Out: 2397 [Urine:55; Other:2342]   Intake/Output this shift:  Total I/O In: 112.4 [I.V.:100.3; IV Piggyback:12.1] Out: 103 [Other:103]  Patient not physically examined in room being positive for covid 19   Basic Metabolic Panel: Recent Labs  Lab 07/30/18 1845 07/31/18 0333 08/01/18 0430 08/02/18 0400 08/02/18 1713  08/03/18 0417 08/03/18 2007 08/04/18 0431  NA  --  139 140 137 138 136 137 137  K  --  4.1 4.4 5.3* 5.3* 5.1 6.0* 6.2*  CL  --  104 106 105 104 103 101 101  CO2  --  23 22 19* 18* 16* 20* 21*  GLUCOSE  --  155* 122* 162* 125* 140* 196* 220*  BUN  --  31* 54* 77* 86* 68* 65* 64*  CREATININE  --  2.27* 3.64* 6.02* 7.21* 5.95* 5.60* 5.51*  CALCIUM  --  7.9* 7.6* 7.6* 7.5* 7.9* 8.0* 7.7*  MG 2.1 2.5* 2.5*  --   --  2.8*  --  2.9*  PHOS 5.6* 6.6* 6.3*  --  9.3* 7.5* 9.1* 8.6*    Liver Function Tests: Recent Labs  Lab 07/30/18 0247 07/30/18 1845 08/02/18 1713 08/03/18 0417 08/03/18 2007 08/04/18 0431  AST 61* 60*  --   --   --   --   ALT 36 34  --   --   --   --   ALKPHOS 56 57  --   --   --   --   BILITOT 1.2 1.2  --   --   --   --   PROT 6.8 6.3*  --   --   --   --   ALBUMIN 3.2* 2.9* 2.2* 2.6* 2.4* 2.2*   No results for input(s): LIPASE, AMYLASE in the last 168 hours. No results for input(s): AMMONIA in the last 168  hours.  CBC: Recent Labs  Lab 07/30/18 0247 08/01/18 0430 08/04/18 0431  WBC 2.5* 3.0* 11.9*  HGB 16.5 14.8 13.3  HCT 51.0 47.6 43.8  MCV 88.5 94.3 95.0  PLT 70* 123* 216    Cardiac Enzymes: No results for input(s): CKTOTAL, CKMB, CKMBINDEX, TROPONINI in the last 168 hours.  BNP: Invalid input(s): POCBNP  CBG: Recent Labs  Lab 08/03/18 1502 08/03/18 2014 08/04/18 0011 08/04/18 0409 08/04/18 0834  GLUCAP 145* 172* 225* 203* 175*    Microbiology: Results for orders placed or performed during the hospital encounter of 08/13/2018  MRSA PCR Screening     Status: None   Collection Time: 07/30/18  2:30 AM  Result Value Ref Range Status   MRSA by PCR NEGATIVE NEGATIVE Final    Comment:        The GeneXpert MRSA Assay (FDA approved for NASAL specimens only), is one component of a comprehensive MRSA colonization surveillance program. It is not intended to diagnose MRSA infection nor to guide or monitor treatment for MRSA  infections. Performed at [facility], [place] [address] [place], [place] [address]     Coagulation Studies: No results for input(s): LABPROT, INR in the last 72 hours.  Urinalysis: Recent Labs    08/02/18 1837  COLORURINE AMBER*  LABSPEC 1.021  PHURINE 5.0  GLUCOSEU NEGATIVE  HGBUR SMALL*  BILIRUBINUR NEGATIVE  KETONESUR NEGATIVE  PROTEINUR 30*  NITRITE NEGATIVE  LEUKOCYTESUR NEGATIVE      Imaging: Dg Chest Port 1 View  Result Date: 08/02/2018 CLINICAL DATA:  Central line placement. EXAM: PORTABLE CHEST 1 VIEW COMPARISON:  07/30/2018 FINDINGS: Left internal jugular central line tip is in the SVC at the azygos level. Endotracheal tube tip 4 cm above the carina. Orogastric or nasogastric tube enters the abdomen. Right lung appears improved with less edema. There is persistent and possibly worsened pneumonia on the left, particularly in the lower lobe. IMPRESSION: Left internal jugular central line tip in the SVC at the azygos level. No pneumothorax. Endotracheal tube and nasogastric or orogastric tube satisfactory. Improved aeration of the right lung. Persistent and possibly worsened pneumonia on the left. Electronically Signed   By: Nelson Chimes M.D.   On: 08/02/2018 16:46     Medications:   .  prismasol BGK 4/2.5 400 mL/hr at 08/03/18 2229  .  prismasol BGK 4/2.5    . sodium chloride    . fentaNYL infusion INTRAVENOUS 400 mcg/hr (08/03/18 1910)  . heparin 10,000 units/ 20 mL infusion syringe 900 Units/hr (08/04/18 0909)  . midazolam 3 mg/hr (08/04/18 0900)  . phenylephrine (NEO-SYNEPHRINE) Adult infusion Stopped (08/04/18 0610)  . piperacillin-tazobactam (ZOSYN)  IV Stopped (08/04/18 7989)  . prismasol BGK 4/2.5 2,000 mL/hr at 08/04/18 0603   . amLODipine  10 mg Per Tube Daily  . chlorhexidine gluconate (MEDLINE KIT)  15 mL Mouth Rinse BID  . Chlorhexidine Gluconate Cloth  6 each Topical Daily  . Chlorhexidine Gluconate Cloth  6 each Topical Daily  .  enoxaparin (LOVENOX) injection  40 mg Subcutaneous QHS  . insulin aspart  0-9 Units Subcutaneous Q4H  . mouth rinse  15 mL Mouth Rinse 10 times per day  . pantoprazole (PROTONIX) IV  40 mg Intravenous Q24H  . sodium bicarbonate      . sodium chloride flush  10-40 mL Intracatheter Q12H   Place/Maintain arterial line **AND** sodium chloride, acetaminophen (TYLENOL) oral liquid 160 mg/5 mL, albuterol, docusate, fentaNYL, heparin, heparin, hydrALAZINE, lip balm, midazolam, midazolam, ondansetron **  OR** ondansetron (ZOFRAN) IV, sodium chloride, sodium chloride flush  Assessment/ Plan:   Acute respiratory failure.  Suspect ATN possibly contrast related.  Worsening respiratory acidosis and metabolic acidosis CRRT initiated  Hyperkalemia will remove potassium from bath today we will follow potassium  Pneumonia secondary to COVID-19 on ventilator with high support  Diabetes mellitus per primary team  Obesity and obstructive sleep apnea  History of atrial fibrillation  Electrolytes appear to be stable  Acid-base.  Combined metabolic and respiratory acidosis.  Somewhat improved  Volume being kept even at this present time.  Disposition appears a little progress is being made at this point continues to be oligo anuric.  Suggest withdrawal of care if no improvement seen within the next 24 to 48 hours.     LOS: [place] _0 _1 :35 AM

## 2018-08-04 NOTE — Progress Notes (Signed)
Nutrition Follow-up  RD working remotely.   DOCUMENTATION CODES:   Morbid obesity  INTERVENTION:  - when TF appropriate, recommend: Nepro @ 30 ml/hr with 60 ml prostat QID which will provide 2096 kcal, 178 grams protein (94% protein need). - this regimen will also provide 1425 mg K and 918 mg Phos. * although Nepro is not necessary for a patient on CRRT, patient with persistent hyperkalemia and hyperphosphatemia despite CRRT.     NUTRITION DIAGNOSIS:   Inadequate oral intake related to inability to eat as evidenced by NPO status. -ongoing  GOAL:   Provide needs based on ASPEN/SCCM guidelines -unmet  MONITOR:   Vent status, Weight trends, Labs, I & O's, Other (Comment)(initiation of nutrition support)  ASSESSMENT:   53 year-old male admitted on 4/1 with abdominal pain which had been persistent for several days; noted to have diverticulitis at [facility] and was transferred to [facility] for further evaluation. Patient found to be COVID POSITIVE at PCP office earlier in the week. PCCM consulted 4/2 for worsening hypoxemia.   Significant Events: 4/1- admitted 4/2- intubated and OGT placed; proned 4/3- proned 4/5- CRRT initiation 4/6- supine   Weight +6.4 kg/14 lb since admission (4/1). No BM since date of admission. OGT to LIS with last output of 20 ml yesterday at ~9:00 AM. Patient remains intubated and on CRRT. He is now in supine position.   Per Brandi's note yesterday: diverticulitis with zosyn to end today (4/7), ARDS, s/p prone positioning and paralytics during that time, COVID-19 positive, AKI with metabolic acidosis. RN notes indicate frequent clotting of CRRT filters.    Patient is currently intubated on ventilator support MV: 17.5 L/min as of 8 AM Temp (24hrs), Avg:97.6 F (36.4 C), Min:96.8 F (36 C), Max:98.4 F (36.9 C) Propofol: none   Medications reviewed; sliding scale novolog. Labs reviewed; CBGs: 225 and 203 mg/dl today, K: 6.2 mg/dl, Phos:  8.6 mg/dl, Mg: 2.9 mg/dl, BUN: 64 mg/dl, creatinine: 5.51 mg/dl, Ca: 7.7 mg/dl, GFR: 11 ml/min. Drip; versed @ 3 mg/hr     Diet Order:   Diet Order            Diet NPO time specified Except for: Ice Chips, Sips with Meds  Diet effective now              EDUCATION NEEDS:   No education needs have been identified at this time  Skin:  Skin Assessment: Reviewed RN Assessment  Last BM:  4/1  Height:   Ht Readings from Last 1 Encounters:  07/30/18 5\' 10"  (1.778 m)    Weight:   Wt Readings from Last 1 Encounters:  08/04/18 (!) 140.2 kg    Ideal Body Weight:  75.45 kg  BMI:  Body mass index is 44.35 kg/m.  Estimated Nutritional Needs:   Kcal:  8453-6468 kcal  Protein:  >/= 189 grams  Fluid:  >/= 2.2 L/day     Jarome Matin, MS, RD, LDN, [facility] Inpatient Clinical Dietitian Pager # [phone] After hours/weekend pager # [phone]

## 2018-08-05 LAB — MAGNESIUM: Magnesium: 3 mg/dL — ABNORMAL HIGH (ref 1.7–2.4)

## 2018-08-05 LAB — COMPREHENSIVE METABOLIC PANEL
ALT: 53 U/L — ABNORMAL HIGH (ref 0–44)
AST: 114 U/L — ABNORMAL HIGH (ref 15–41)
Albumin: 2.4 g/dL — ABNORMAL LOW (ref 3.5–5.0)
Alkaline Phosphatase: 66 U/L (ref 38–126)
Anion gap: 13 (ref 5–15)
BUN: 55 mg/dL — ABNORMAL HIGH (ref 6–20)
CO2: 24 mmol/L (ref 22–32)
Calcium: 8 mg/dL — ABNORMAL LOW (ref 8.9–10.3)
Chloride: 100 mmol/L (ref 98–111)
Creatinine, Ser: 3.59 mg/dL — ABNORMAL HIGH (ref 0.61–1.24)
GFR calc Af Amer: 21 mL/min — ABNORMAL LOW (ref >60)
GFR calc non Af Amer: 18 mL/min — ABNORMAL LOW (ref >60)
Glucose, Bld: 133 mg/dL — ABNORMAL HIGH (ref 70–99)
Potassium: 3.9 mmol/L (ref 3.5–5.1)
Sodium: 137 mmol/L (ref 135–145)
Total Bilirubin: 5.4 mg/dL — ABNORMAL HIGH (ref 0.3–1.2)
Total Protein: 6.3 g/dL — ABNORMAL LOW (ref 6.5–8.1)

## 2018-08-05 LAB — RENAL FUNCTION PANEL
Albumin: 2.6 g/dL — ABNORMAL LOW (ref 3.5–5.0)
Anion gap: 12 (ref 5–15)
BUN: 51 mg/dL — ABNORMAL HIGH (ref 6–20)
CO2: 24 mmol/L (ref 22–32)
Calcium: 8.1 mg/dL — ABNORMAL LOW (ref 8.9–10.3)
Chloride: 101 mmol/L (ref 98–111)
Creatinine, Ser: 3.23 mg/dL — ABNORMAL HIGH (ref 0.61–1.24)
GFR calc Af Amer: 24 mL/min — ABNORMAL LOW (ref >60)
GFR calc non Af Amer: 21 mL/min — ABNORMAL LOW (ref >60)
Glucose, Bld: 137 mg/dL — ABNORMAL HIGH (ref 70–99)
Phosphorus: 5 mg/dL — ABNORMAL HIGH (ref 2.5–4.6)
Potassium: 3.9 mmol/L (ref 3.5–5.1)
Sodium: 137 mmol/L (ref 135–145)

## 2018-08-05 LAB — CBC
HCT: 37.7 % — ABNORMAL LOW (ref 39.0–52.0)
Hemoglobin: 11.9 g/dL — ABNORMAL LOW (ref 13.0–17.0)
MCH: 29 pg (ref 26.0–34.0)
MCHC: 31.6 g/dL (ref 30.0–36.0)
MCV: 92 fL (ref 80.0–100.0)
Platelets: 174 10*3/uL (ref 150–400)
RBC: 4.1 MIL/uL — ABNORMAL LOW (ref 4.22–5.81)
RDW: 17.5 % — ABNORMAL HIGH (ref 11.5–15.5)
WBC: 8.3 10*3/uL (ref 4.0–10.5)
nRBC: 1.3 % — ABNORMAL HIGH (ref 0.0–0.2)

## 2018-08-05 LAB — GLUCOSE, CAPILLARY
Glucose-Capillary: 112 mg/dL — ABNORMAL HIGH (ref 70–99)
Glucose-Capillary: 115 mg/dL — ABNORMAL HIGH (ref 70–99)
Glucose-Capillary: 119 mg/dL — ABNORMAL HIGH (ref 70–99)
Glucose-Capillary: 121 mg/dL — ABNORMAL HIGH (ref 70–99)
Glucose-Capillary: 130 mg/dL — ABNORMAL HIGH (ref 70–99)
Glucose-Capillary: 144 mg/dL — ABNORMAL HIGH (ref 70–99)
Glucose-Capillary: 144 mg/dL — ABNORMAL HIGH (ref 70–99)
Glucose-Capillary: 146 mg/dL — ABNORMAL HIGH (ref 70–99)

## 2018-08-05 LAB — LACTATE DEHYDROGENASE: LDH: 366 U/L — ABNORMAL HIGH (ref 98–192)

## 2018-08-05 LAB — PHOSPHORUS: Phosphorus: 5.7 mg/dL — ABNORMAL HIGH (ref 2.5–4.6)

## 2018-08-05 LAB — APTT: aPTT: 49 seconds — ABNORMAL HIGH (ref 24–36)

## 2018-08-05 MED ORDER — AMIODARONE LOAD VIA INFUSION
150.0000 mg | Freq: Once | INTRAVENOUS | Status: AC
  Administered 2018-08-05: 150 mg via INTRAVENOUS
  Filled 2018-08-05: qty 83.34

## 2018-08-05 MED ORDER — PRISMASOL BGK 4/2.5 32-4-2.5 MEQ/L REPLACEMENT SOLN
Status: DC
  Administered 2018-08-05 – 2018-08-08 (×6): via INTRAVENOUS_CENTRAL
  Filled 2018-08-05 (×10): qty 5000

## 2018-08-05 MED ORDER — AMIODARONE HCL IN DEXTROSE 360-4.14 MG/200ML-% IV SOLN
30.0000 mg/h | INTRAVENOUS | Status: DC
  Administered 2018-08-05 – 2018-08-22 (×27): 30 mg/h via INTRAVENOUS
  Filled 2018-08-05 (×35): qty 200

## 2018-08-05 MED ORDER — AMIODARONE HCL IN DEXTROSE 360-4.14 MG/200ML-% IV SOLN
60.0000 mg/h | INTRAVENOUS | Status: AC
  Administered 2018-08-05: 15:00:00 60 mg/h via INTRAVENOUS
  Filled 2018-08-05 (×2): qty 200

## 2018-08-05 MED ORDER — PRISMASOL BGK 4/2.5 32-4-2.5 MEQ/L IV SOLN
INTRAVENOUS | Status: DC
  Administered 2018-08-05 – 2018-08-08 (×15): via INTRAVENOUS_CENTRAL
  Filled 2018-08-05 (×31): qty 5000

## 2018-08-05 MED ORDER — PRISMASOL BGK 4/2.5 32-4-2.5 MEQ/L REPLACEMENT SOLN
Status: DC
  Administered 2018-08-05 – 2018-08-08 (×6): via INTRAVENOUS_CENTRAL
  Filled 2018-08-05 (×10): qty 5000

## 2018-08-05 MED ORDER — HEPARIN SODIUM (PORCINE) 1000 UNIT/ML DIALYSIS: 1000.0000 [IU] | INTRAMUSCULAR | Status: DC | PRN

## 2018-08-05 MED ORDER — SODIUM CHLORIDE 0.9 % FOR CRRT: INTRAVENOUS_CENTRAL | Status: DC | PRN

## 2018-08-05 MED ORDER — METOPROLOL TARTRATE 5 MG/5ML IV SOLN
5.0000 mg | Freq: Once | INTRAVENOUS | Status: AC
  Administered 2018-08-05: 12:00:00 5 mg via INTRAVENOUS

## 2018-08-05 MED ORDER — METOPROLOL TARTRATE 5 MG/5ML IV SOLN
INTRAVENOUS | Status: AC
  Filled 2018-08-05: qty 5

## 2018-08-05 MED ORDER — PRISMASOL BGK 4/2.5 32-4-2.5 MEQ/L IV SOLN
INTRAVENOUS | Status: DC
  Administered 2018-08-05: 11:00:00 via INTRAVENOUS_CENTRAL
  Filled 2018-08-05 (×6): qty 5000

## 2018-08-05 NOTE — Progress Notes (Signed)
NAME:  [name] Long, MRN:  [id number], DOB:  Nov 21, 1965, LOS: 7 ADMISSION DATE:  08/02/2018, CONSULTATION DATE:  07/30/2018 REFERRING MD:  07/30/2018, CHIEF COMPLAINT:   Brief History   53 y/o male admitted on 4/1 with abdominal pain which had been persistent for several days.  COVID POSITIVE.  PCCM consulted 4/2 for worsening hypoxemia.   He was noted to be COVID positive by a PCP check earlier in the week.  He went to [facility] with abdominal pain, noted to have diverticulitis, was trasnferred to [facility] for further evaluation.    Past Medical History  DM2, HTN, Former smoker, OSA  [place] [facility] Events   4/01 admission for diverticulitis 4/02 PCCM consulted for worsening dyspnea, hypoxemia 4/02 Proned  4/03 Proned  4/05 Improved oxygenation however ABG with respiratory and metabolic acidosis. Poor UOP 4/06 Supine 4/07 CVVHD, difficulty with filters clotting  4/08 CVVHD ongoing   Consults:  4/2 PCCM  Procedures:  ETT 4/2 >>  L IJ HD 4/6 >> R Rad Aline 4/6 >>   Significant Diagnostic Tests:    Micro Data:  3/30 COVID 19 test positive (outside PCP)  Antimicrobials:  Zosyn 4/1 >>   Interim history/subjective:  CVVHD ongoing.  AFwRVR afternoon of 4/8.    Objective   Blood pressure (!) 170/88, pulse 94, temperature (!) 96.4 F (35.8 C), temperature source Axillary, resp. rate (!) 35, height 5\' 10"  (1.778 m), weight (!) 136.2 kg, SpO2 97 %. CVP:  [4 mmHg-11 mmHg] 8 mmHg  Vent Mode: PRVC FiO2 (%):  [40 %-60 %] 40 % Set Rate:  [35 bmp] 35 bmp Vt Set:  [510 mL] 510 mL PEEP:  [15 cmH20] 15 cmH20 Plateau Pressure:  [27 cmH20-29 cmH20] 27 cmH20   Intake/Output Summary (Last 24 hours) at 08/05/2018 0932 Last data filed at 08/05/2018 0900 Gross per 24 hour  Intake 1192.59 ml  Output 1596 ml  Net -403.41 ml   Filed Weights   08/03/18 0412 08/04/18 0437 08/05/18 0500  Weight: (!) 138.7 kg (!) 140.2 kg (!) 136.2 kg   Physical Exam: General: critically ill appearing  male lying in bed on vent   HEENT: MM pink/moist, ETT, LIJ HD cath Neuro: sedate CV: s1s2 rrr, no m/r/g PULM: even/non-labored, lungs bilaterally diminished  OI:ZTIW, non-tender, bsx4 active  Extremities: warm/dry, generalized edema  Skin: no rashes.  Small abrasions on face.   Resolved Hospital Problem list     Assessment & Plan:   Diverticulitis P: Monitor  Completed ABX  Acute respiratory failure with hypoxemia  -paralytics + prone 4/2-4/5 Mixed Acidosis  P: ARDS protocol, low Vt ventilation > 7cc Continue oxycodone 5mg  Q4  Reduce fentanyl / versed as able  Volume removal per CVVHD  SARS COV Pneumonia -completed plaquenil, abx -not a candidate for tocilizumab due to diverticulitis  P: Supportive care   AF with RVR  P: Amiodarone bolus with gtt  AKI with metabolic acidosis  P: Appreciate Nephrology input  CVVHD for volume removal  Trend BMP / urinary output Replace electrolytes as indicated Avoid nephrotoxic agents, ensure adequate renal perfusion  DM2 P: SSI   OSA P: Monitor / intubated   Best practice:  Diet:  tube feeding Pain/Anxiety/Delirium protocol (if indicated): yes, RASS -2 to -3 VAP protocol (if indicated): yes DVT prophylaxis: sub q heparin GI prophylaxis: pantoprazole Glucose control: SSI Mobility: bed rest Code Status: full Family Communication: Limited code blue established / no CPR.  Reviewed plan of possible stopping HD if no significant improvement.  We reviewed the concept of palliative extubation as well.  Butch Penny indicates understanding, would like to continue support until tomorrow and reassess. Disposition: ICU  Labs   CBC: Recent Labs  Lab 07/30/18 0247 08/01/18 0430 08/04/18 0431 08/05/18 0500  WBC 2.5* 3.0* 11.9* 8.3  HGB 16.5 14.8 13.3 11.9*  HCT 51.0 47.6 43.8 37.7*  MCV 88.5 94.3 95.0 92.0  PLT 70* 123* 216 952    Basic Metabolic Panel: Recent Labs  Lab 07/31/18 0333 08/01/18 0430  08/03/18 0417  08/03/18 2007 08/04/18 0431 08/04/18 1600 08/05/18 0500  NA 139 140   < > 136 137 137 139 137  K 4.1 4.4   < > 5.1 6.0* 6.2* 4.8 3.9  CL 104 106   < > 103 101 101 104 100  CO2 23 22   < > 16* 20* 21* 21* 24  GLUCOSE 155* 122*   < > 140* 196* 220* 130* 133*  BUN 31* 54*   < > 68* 65* 64* 58* 55*  CREATININE 2.27* 3.64*   < > 5.95* 5.60* 5.51* 4.28* 3.59*  CALCIUM 7.9* 7.6*   < > 7.9* 8.0* 7.7* 7.8* 8.0*  MG 2.5* 2.5*  --  2.8*  --  2.9*  --  3.0*  PHOS 6.6* 6.3*   < > 7.5* 9.1* 8.6* 6.2* 5.7*   < > = values in this interval not displayed.   GFR: Estimated Creatinine Clearance: 33.5 mL/min (A) (by C-G formula based on SCr of 3.59 mg/dL (H)). Recent Labs  Lab 07/30/18 0247 08/01/18 0430 08/04/18 0431 08/05/18 0500  WBC 2.5* 3.0* 11.9* 8.3    Liver Function Tests: Recent Labs  Lab 07/30/18 0247 07/30/18 1845  08/03/18 0417 08/03/18 2007 08/04/18 0431 08/04/18 1600 08/05/18 0500  AST 61* 60*  --   --   --   --   --  114*  ALT 36 34  --   --   --   --   --  53*  ALKPHOS 56 57  --   --   --   --   --  66  BILITOT 1.2 1.2  --   --   --   --   --  5.4*  PROT 6.8 6.3*  --   --   --   --   --  6.3*  ALBUMIN 3.2* 2.9*   < > 2.6* 2.4* 2.2* 2.5* 2.4*   < > = values in this interval not displayed.   No results for input(s): LIPASE, AMYLASE in the last 168 hours. No results for input(s): AMMONIA in the last 168 hours.  ABG    Component Value Date/Time   PHART 7.236 (L) 08/04/2018 0750   PCO2ART 55.7 (H) 08/04/2018 0750   PO2ART 73.6 (L) 08/04/2018 0750   HCO3 22.8 08/04/2018 0750   ACIDBASEDEF 4.9 (H) 08/04/2018 0750   O2SAT 94.1 08/04/2018 0750     Coagulation Profile: No results for input(s): INR, PROTIME in the last 168 hours.  Cardiac Enzymes: No results for input(s): CKTOTAL, CKMB, CKMBINDEX, TROPONINI in the last 168 hours.  HbA1C: No results found for: HGBA1C  CBG: Recent Labs  Lab 08/04/18 1149 08/04/18 1552 08/04/18 2011 08/04/18 2307 08/05/18 0455   GLUCAP 130* 120* 146* 144* 121*    Critical care time: 40 minutes     Noe Gens, NP-C [facility] Pgr: [phone] or if no answer [phone] 08/05/2018, 9:32 AM

## 2018-08-05 NOTE — Progress Notes (Signed)
[facility] ROUNDING NOTE   Subjective:   This is a 53 year old gentleman diabetes hypertension obstructive sleep apnea on CPAP.  Admitted with  COVID-19.  He has been treated by CCM with Plaquenil azithromycin.  CRRT was initiated 08/02/2018.  Has had some clotting of the filter and required initiation of heparin with dialysis 08/03/2018.  It appears that he is now off pressors.  Pressure appears to be better he is anasarca with massive volume overload  Blood pressure 170/88 pulse 94 temperature 96.4 O2 sats 93% 40% FiO2  Chest x-ray showed improved aeration of the right lung with persistent pneumonia in the left lung 08/02/2018   CT abd from [facility] on 4/1 - R kidney visualized, no hydro noted, probably there is dye in the renal pelvis  Sodium 137 potassium 3.9 chloride 100 CO2 24 BUN 35 creatinine 3.59 glucose 133 calcium 8.0 phosphorus 5.7 magnesium 3.0 AST 114 ALT 53 bilirubin 5.4 WBC 8.3 hemoglobin 11.9 platelets 174  , , Zosyn 2.25 mg every 6 hours, amlodipine 10 mg daily, Protonix 40 mg daily,   Objective:  Vital signs in last 24 hours:  Temp:  [96 F (35.6 C)-99.2 F (37.3 C)] 96.4 F (35.8 C) (04/08 0800) Pulse Rate:  [79-111] 94 (04/08 0838) Resp:  [0-35] 35 (04/08 0838) BP: (110-170)/(39-88) 170/88 (04/08 0838) SpO2:  [93 %-99 %] 97 % (04/08 0800) Arterial Line BP: (97-173)/(47-86) 163/86 (04/08 0800) FiO2 (%):  [40 %-60 %] 40 % (04/08 0835) Weight:  [136.2 kg] 136.2 kg (04/08 0500)  Weight change: -4 kg Filed Weights   08/03/18 0412 08/04/18 0437 08/05/18 0500  Weight: (!) 138.7 kg (!) 140.2 kg (!) 136.2 kg    Intake/Output: I/O last 3 completed shifts: In: 2001.5 [P.O.:100; I.V.:1426; NG/GT:65; IV Piggyback:410.5] Out: 2083 [Urine:24; ZJQBH:4193]   Intake/Output this shift:  Total I/O In: 126.9 [I.V.:66.9; NG/GT:60] Out: 236 [Other:236]  Patient not physically examined in room being positive for covid 19   Basic Metabolic Panel: Recent Labs  Lab  07/31/18 0333 08/01/18 0430  08/03/18 0417 08/03/18 2007 08/04/18 0431 08/04/18 1600 08/05/18 0500  NA 139 140   < > 136 137 137 139 137  K 4.1 4.4   < > 5.1 6.0* 6.2* 4.8 3.9  CL 104 106   < > 103 101 101 104 100  CO2 23 22   < > 16* 20* 21* 21* 24  GLUCOSE 155* 122*   < > 140* 196* 220* 130* 133*  BUN 31* 54*   < > 68* 65* 64* 58* 55*  CREATININE 2.27* 3.64*   < > 5.95* 5.60* 5.51* 4.28* 3.59*  CALCIUM 7.9* 7.6*   < > 7.9* 8.0* 7.7* 7.8* 8.0*  MG 2.5* 2.5*  --  2.8*  --  2.9*  --  3.0*  PHOS 6.6* 6.3*   < > 7.5* 9.1* 8.6* 6.2* 5.7*   < > = values in this interval not displayed.    Liver Function Tests: Recent Labs  Lab 07/30/18 0247 07/30/18 1845  08/03/18 0417 08/03/18 2007 08/04/18 0431 08/04/18 1600 08/05/18 0500  AST 61* 60*  --   --   --   --   --  114*  ALT 36 34  --   --   --   --   --  53*  ALKPHOS 56 57  --   --   --   --   --  66  BILITOT 1.2 1.2  --   --   --   --   --  5.4*  PROT 6.8 6.3*  --   --   --   --   --  6.3*  ALBUMIN 3.2* 2.9*   < > 2.6* 2.4* 2.2* 2.5* 2.4*   < > = values in this interval not displayed.   No results for input(s): LIPASE, AMYLASE in the last 168 hours. No results for input(s): AMMONIA in the last 168 hours.  CBC: Recent Labs  Lab 07/30/18 0247 08/01/18 0430 08/04/18 0431 08/05/18 0500  WBC 2.5* 3.0* 11.9* 8.3  HGB 16.5 14.8 13.3 11.9*  HCT 51.0 47.6 43.8 37.7*  MCV 88.5 94.3 95.0 92.0  PLT 70* 123* 216 174    Cardiac Enzymes: No results for input(s): CKTOTAL, CKMB, CKMBINDEX, TROPONINI in the last 168 hours.  BNP: Invalid input(s): POCBNP  CBG: Recent Labs  Lab 08/04/18 1149 08/04/18 1552 08/04/18 2011 08/04/18 2307 08/05/18 0455  GLUCAP 130* 120* 146* 144* 121*    Microbiology: Results for orders placed or performed during the hospital encounter of 08/27/2018  MRSA PCR Screening     Status: None   Collection Time: 07/30/18  2:30 AM  Result Value Ref Range Status   MRSA by PCR NEGATIVE NEGATIVE Final     Comment:        The GeneXpert MRSA Assay (FDA approved for NASAL specimens only), is one component of a comprehensive MRSA colonization surveillance program. It is not intended to diagnose MRSA infection nor to guide or monitor treatment for MRSA infections. Performed at [facility], [place] [address], [place], [place] [address]     Coagulation Studies: No results for input(s): LABPROT, INR in the last 72 hours.  Urinalysis: Recent Labs    08/02/18 1837  COLORURINE AMBER*  LABSPEC 1.021  PHURINE 5.0  GLUCOSEU NEGATIVE  HGBUR SMALL*  BILIRUBINUR NEGATIVE  KETONESUR NEGATIVE  PROTEINUR 30*  NITRITE NEGATIVE  LEUKOCYTESUR NEGATIVE      Imaging: No results found.   Medications:   . sodium chloride    . fentaNYL infusion INTRAVENOUS 200 mcg/hr (08/05/18 0855)  . heparin 10,000 units/ 20 mL infusion syringe 2,000 Units/hr (08/05/18 0649)  . midazolam 3 mg/hr (08/05/18 0855)  . phenylephrine (NEO-SYNEPHRINE) Adult infusion Stopped (08/04/18 1504)  . prismasol BGK 0/2.5 500 mL/hr at 08/04/18 2051  . prismasol BGK 0/2.5 500 mL/hr at 08/04/18 2051  . prismasol BGK 0/2.5 2,000 mL/hr at 08/05/18 0510   . amLODipine  10 mg Per Tube Daily  . chlorhexidine gluconate (MEDLINE KIT)  15 mL Mouth Rinse BID  . Chlorhexidine Gluconate Cloth  6 each Topical Daily  . Chlorhexidine Gluconate Cloth  6 each Topical Daily  . enoxaparin (LOVENOX) injection  40 mg Subcutaneous QHS  . insulin aspart  0-9 Units Subcutaneous Q4H  . mouth rinse  15 mL Mouth Rinse 10 times per day  . oxyCODONE  5 mg Per Tube Q4H  . pantoprazole (PROTONIX) IV  40 mg Intravenous Q24H  . sodium chloride flush  10-40 mL Intracatheter Q12H   Place/Maintain arterial line **AND** sodium chloride, acetaminophen (TYLENOL) oral liquid 160 mg/5 mL, albuterol, docusate, fentaNYL, heparin, heparin, hydrALAZINE, lip balm, midazolam, midazolam, ondansetron **OR** ondansetron (ZOFRAN) IV, sodium  chloride, sodium chloride, sodium chloride flush  Assessment/ Plan:   Acute respiratory failure.  Suspect ATN possibly contrast related.  Worsening respiratory acidosis and metabolic acidosis.  CRRT since 08/02/2018.  Hyperkalemia has resolved will switch back to 2 potassium baths  Pneumonia secondary to COVID-19 on ventilator with high support  Diabetes  mellitus per primary team  Obesity and obstructive sleep apnea  History of atrial fibrillation  Electrolytes appear to be stable  Acid-base.  Combined metabolic and respiratory acidosis.  Somewhat improved  Volume massive volume overload we will pull 100 to 150 cc/h  Disposition now appears to be off pressors blood pressure improved we will discontinue amlodipine.  We will try to pull fluid with CRRT.     LOS: [place] '@TODAY' '@9' :49 AM

## 2018-08-05 NOTE — Progress Notes (Signed)
Patients HR maintaining in the 130's EKG performed showing A-fib RVR, RN hand delivered to Dr. Vaughan Browner.  MD to place orders.

## 2018-08-05 NOTE — Progress Notes (Signed)
eLink Physician-Brief Progress Note Patient Name: [name] Long DOB: 1965-08-30 MRN: [id number]   Date of Service  08/05/2018  HPI/Events of Note  Patient on amiodarone drip for afib RVR with HR currently 120s, BP stable  eICU Interventions   Continue amiodarone drip for now     Intervention Category Major Interventions: Arrhythmia - evaluation and management  Judd Lien 08/05/2018, 10:50 PM

## 2018-08-06 DIAGNOSIS — J80 Acute respiratory distress syndrome: Secondary | ICD-10-CM

## 2018-08-06 LAB — POCT ACTIVATED CLOTTING TIME
Activated Clotting Time: 191 seconds
Activated Clotting Time: 186 seconds
Activated Clotting Time: 191 seconds
Activated Clotting Time: 197 seconds
Activated Clotting Time: 180 seconds
Activated Clotting Time: 180 seconds
Activated Clotting Time: 147 seconds
Activated Clotting Time: 147 seconds
Activated Clotting Time: 158 seconds
Activated Clotting Time: 164 seconds
Activated Clotting Time: 169 seconds
Activated Clotting Time: 175 seconds
Activated Clotting Time: 180 seconds
Activated Clotting Time: 180 seconds

## 2018-08-06 LAB — RENAL FUNCTION PANEL
Albumin: 2.3 g/dL — ABNORMAL LOW (ref 3.5–5.0)
Albumin: 2.2 g/dL — ABNORMAL LOW (ref 3.5–5.0)
Anion gap: 12 (ref 5–15)
Anion gap: 11 (ref 5–15)
BUN: 57 mg/dL — ABNORMAL HIGH (ref 6–20)
BUN: 60 mg/dL — ABNORMAL HIGH (ref 6–20)
CO2: 22 mmol/L (ref 22–32)
CO2: 22 mmol/L (ref 22–32)
Calcium: 7.8 mg/dL — ABNORMAL LOW (ref 8.9–10.3)
Calcium: 7.8 mg/dL — ABNORMAL LOW (ref 8.9–10.3)
Chloride: 99 mmol/L (ref 98–111)
Chloride: 103 mmol/L (ref 98–111)
Creatinine, Ser: 3.38 mg/dL — ABNORMAL HIGH (ref 0.61–1.24)
Creatinine, Ser: 3.26 mg/dL — ABNORMAL HIGH (ref 0.61–1.24)
GFR calc Af Amer: 23 mL/min — ABNORMAL LOW (ref >60)
GFR calc Af Amer: 24 mL/min — ABNORMAL LOW (ref >60)
GFR calc non Af Amer: 20 mL/min — ABNORMAL LOW (ref >60)
GFR calc non Af Amer: 21 mL/min — ABNORMAL LOW (ref >60)
Glucose, Bld: 110 mg/dL — ABNORMAL HIGH (ref 70–99)
Glucose, Bld: 146 mg/dL — ABNORMAL HIGH (ref 70–99)
Phosphorus: 4.2 mg/dL (ref 2.5–4.6)
Phosphorus: 3.6 mg/dL (ref 2.5–4.6)
Potassium: 3.7 mmol/L (ref 3.5–5.1)
Potassium: 3.7 mmol/L (ref 3.5–5.1)
Sodium: 133 mmol/L — ABNORMAL LOW (ref 135–145)
Sodium: 136 mmol/L (ref 135–145)

## 2018-08-06 LAB — CBC
HCT: 37.5 % — ABNORMAL LOW (ref 39.0–52.0)
Hemoglobin: 11.5 g/dL — ABNORMAL LOW (ref 13.0–17.0)
MCH: 28.5 pg (ref 26.0–34.0)
MCHC: 30.7 g/dL (ref 30.0–36.0)
MCV: 93.1 fL (ref 80.0–100.0)
Platelets: 191 10*3/uL (ref 150–400)
RBC: 4.03 MIL/uL — ABNORMAL LOW (ref 4.22–5.81)
RDW: 17.7 % — ABNORMAL HIGH (ref 11.5–15.5)
WBC: 8.8 10*3/uL (ref 4.0–10.5)
nRBC: 2.4 % — ABNORMAL HIGH (ref 0.0–0.2)

## 2018-08-06 LAB — BLOOD GAS, ARTERIAL
Acid-base deficit: 2 mmol/L (ref 0.0–2.0)
Bicarbonate: 23.7 mmol/L (ref 20.0–28.0)
Drawn by: 331471
FIO2: 40
MECHVT: 510 mL
O2 Saturation: 90.6 %
PEEP: 15 cmH2O
Patient temperature: 98.6
RATE: 35 resp/min
pCO2 arterial: 47 mmHg (ref 32.0–48.0)
pH, Arterial: 7.322 — ABNORMAL LOW (ref 7.350–7.450)
pO2, Arterial: 64.3 mmHg — ABNORMAL LOW (ref 83.0–108.0)

## 2018-08-06 LAB — APTT: aPTT: 33 seconds (ref 24–36)

## 2018-08-06 LAB — GLUCOSE, CAPILLARY
Glucose-Capillary: 123 mg/dL — ABNORMAL HIGH (ref 70–99)
Glucose-Capillary: 111 mg/dL — ABNORMAL HIGH (ref 70–99)
Glucose-Capillary: 109 mg/dL — ABNORMAL HIGH (ref 70–99)
Glucose-Capillary: 133 mg/dL — ABNORMAL HIGH (ref 70–99)
Glucose-Capillary: 128 mg/dL — ABNORMAL HIGH (ref 70–99)

## 2018-08-06 LAB — MAGNESIUM: Magnesium: 3.2 mg/dL — ABNORMAL HIGH (ref 1.7–2.4)

## 2018-08-06 MED ORDER — VITAL HIGH PROTEIN PO LIQD
1000.0000 mL | ORAL | Status: DC
  Administered 2018-08-06: 1000 mL

## 2018-08-06 MED ORDER — ALTEPLASE 2 MG IJ SOLR
2.0000 mg | Freq: Once | INTRAMUSCULAR | Status: AC
  Administered 2018-08-06: 06:00:00 2 mg

## 2018-08-06 MED ORDER — CLONAZEPAM 1 MG PO TABS
1.0000 mg | ORAL_TABLET | Freq: Two times a day (BID) | ORAL | Status: DC
  Administered 2018-08-06 – 2018-08-09 (×6): 1 mg via ORAL
  Filled 2018-08-06 (×6): qty 1

## 2018-08-06 MED ORDER — PRO-STAT SUGAR FREE PO LIQD
60.0000 mL | Freq: Two times a day (BID) | ORAL | Status: DC
  Administered 2018-08-06 – 2018-08-12 (×13): 60 mL
  Filled 2018-08-06 (×13): qty 60

## 2018-08-06 MED ORDER — ALTEPLASE 2 MG IJ SOLR
INTRAMUSCULAR | Status: AC
  Filled 2018-08-06: qty 4

## 2018-08-06 NOTE — Progress Notes (Signed)
NAME:  [name] Long, MRN:  [id number], DOB:  1965/07/13, LOS: 8 ADMISSION DATE:  08/11/2018, CONSULTATION DATE:  07/30/2018 REFERRING MD:  07/30/2018, CHIEF COMPLAINT:   Brief History   53 y/o male admitted on 4/1 with abdominal pain which had been persistent for several days.  COVID POSITIVE.  PCCM consulted 4/2 for worsening hypoxemia.   He was noted to be COVID positive by a PCP check earlier in the week.  He went to [facility] with abdominal pain, noted to have diverticulitis, was trasnferred to [facility] for further evaluation.    Past Medical History  DM2, HTN, Former smoker, OSA  [place] [facility] Events   4/01 admission for diverticulitis 4/02 PCCM consulted for worsening dyspnea, hypoxemia 4/02 Proned  4/03 Proned  4/05 Improved oxygenation however ABG with respiratory and metabolic acidosis. Poor UOP 4/06 Supine 4/07 CVVHD, difficulty with filters clotting  4/08 CVVHD ongoing, AFwRVR afternoon of 4/8, started on amio gtt    Consults:  4/2 PCCM  Procedures:  ETT 4/2 >>  L IJ HD 4/6 >> R Rad Aline 4/6 >>   Significant Diagnostic Tests:    Micro Data:  3/30 COVID 19 test positive (outside PCP)  Antimicrobials:  Zosyn 4/1 >> 4/7  Interim history/subjective:  Afebrile. CVVHD continues.  Not requiring vasopressors.  I/O - neg 1.4L in last 24 hours, remains positive for admit. Versed gtt at 6mg /hr, fentanyl 325 mcg, amiodarone @ 30.   Objective   Blood pressure 127/63, pulse (!) 117, temperature 98.2 F (36.8 C), temperature source Oral, resp. rate 15, height 5\' 10"  (1.778 m), weight 133.8 kg, SpO2 91 %. CVP:  [10 mmHg-20 mmHg] 12 mmHg  Vent Mode: PRVC FiO2 (%):  [40 %] 40 % Set Rate:  [35 bmp] 35 bmp Vt Set:  [510 mL] 510 mL PEEP:  [15 cmH20] 15 cmH20 Plateau Pressure:  [19 cmH20-29 cmH20] 19 cmH20   Intake/Output Summary (Last 24 hours) at 08/06/2018 0955 Last data filed at 08/06/2018 0900 Gross per 24 hour  Intake 1488.61 ml  Output 3113 ml  Net -1624.39 ml    Filed Weights   08/04/18 0437 08/05/18 0500 08/06/18 0423  Weight: (!) 140.2 kg (!) 136.2 kg 133.8 kg   Physical Exam: General: adult male lying in bed in NAD, critically ill appearing  HEENT: MM pink/moist, abrasions to face from prior proning Neuro: sedate CV: s1s2 rrr, no m/r/g PULM: even/non-labored, lungs bilaterally clear anterior, diminished bases  UK:GURK, non-tender, bsx4 active  Extremities: warm/dry, generalized 2+ edema  Skin: small abrasions to face, penis/scrotum   Resolved Hospital Problem list     Assessment & Plan:   Diverticulitis P: Monitor off abx  Follow WBC, fever curve   Acute Respiratory Failure with Hypoxemia  -paralytics + prone 4/2-4/5 Mixed Acidosis  P: Low Vt ventilation, 7cc/kg  Wean PEEP / FiO2 for sats >90% Follow CXR intermittently  Hopeful if volume can be removed, we can make progress with vent (down to 40%) Continue fentanyl, versed Continue oxycodone 5mg  Q4  Add klonopin 1mg  BID  Volume removal per CVVHD  SARS COV Pneumonia -completed plaquenil, abx -not a candidate for tocilizumab due to diverticulitis  P: Continue supportive care   AF with RVR  P: Tele monitoring  Amiodarone gtt   AKI with Metabolic Acidosis  P: Appreciate Nephrology  CVVHD ongoing  Trend BMP / urinary output Replace electrolytes as indicated Avoid nephrotoxic agents, ensure adequate renal perfusion  DM2 P: SSI   OSA P: Monitor / intubated  Best practice:  Diet:  tube feeding Pain/Anxiety/Delirium protocol (if indicated): yes, RASS -2 to -3 VAP protocol (if indicated): yes DVT prophylaxis: sub q heparin GI prophylaxis: pantoprazole Glucose control: SSI Mobility: bed rest Code Status: Limited code blue established / no CPR.  Family Communication: Wife updated on plan of care 4/9.  Will continue supportive care and reassess on day to day for goals of care. She is an Therapist, sports and aware of the critical nature of his illness.  Hopeful to gain  ground in the next few days.  We have not approached the concept of tracheostomy with his wife.  He may ultimately need one if he is making progress.  This may run into difficulty with disposition if he needs trach / HD.  Disposition: ICU  Labs   CBC: Recent Labs  Lab 08/01/18 0430 08/04/18 0431 08/05/18 0500 08/06/18 0437  WBC 3.0* 11.9* 8.3 8.8  HGB 14.8 13.3 11.9* 11.5*  HCT 47.6 43.8 37.7* 37.5*  MCV 94.3 95.0 92.0 93.1  PLT 123* 216 174 325    Basic Metabolic Panel: Recent Labs  Lab 08/01/18 0430  08/03/18 0417  08/04/18 0431 08/04/18 1600 08/05/18 0500 08/05/18 1651 08/06/18 0437  NA 140   < > 136   < > 137 139 137 137 133*  K 4.4   < > 5.1   < > 6.2* 4.8 3.9 3.9 3.7  CL 106   < > 103   < > 101 104 100 101 99  CO2 22   < > 16*   < > 21* 21* 24 24 22   GLUCOSE 122*   < > 140*   < > 220* 130* 133* 137* 110*  BUN 54*   < > 68*   < > 64* 58* 55* 51* 57*  CREATININE 3.64*   < > 5.95*   < > 5.51* 4.28* 3.59* 3.23* 3.38*  CALCIUM 7.6*   < > 7.9*   < > 7.7* 7.8* 8.0* 8.1* 7.8*  MG 2.5*  --  2.8*  --  2.9*  --  3.0*  --  3.2*  PHOS 6.3*   < > 7.5*   < > 8.6* 6.2* 5.7* 5.0* 4.2   < > = values in this interval not displayed.   GFR: Estimated Creatinine Clearance: 35.2 mL/min (A) (by C-G formula based on SCr of 3.38 mg/dL (H)). Recent Labs  Lab 08/01/18 0430 08/04/18 0431 08/05/18 0500 08/06/18 0437  WBC 3.0* 11.9* 8.3 8.8    Liver Function Tests: Recent Labs  Lab 07/30/18 1845  08/04/18 0431 08/04/18 1600 08/05/18 0500 08/05/18 1651 08/06/18 0437  AST 60*  --   --   --  114*  --   --   ALT 34  --   --   --  53*  --   --   ALKPHOS 57  --   --   --  66  --   --   BILITOT 1.2  --   --   --  5.4*  --   --   PROT 6.3*  --   --   --  6.3*  --   --   ALBUMIN 2.9*   < > 2.2* 2.5* 2.4* 2.6* 2.3*   < > = values in this interval not displayed.   No results for input(s): LIPASE, AMYLASE in the last 168 hours. No results for input(s): AMMONIA in the last 168 hours.   ABG    Component Value Date/Time  PHART 7.236 (L) 08/04/2018 0750   PCO2ART 55.7 (H) 08/04/2018 0750   PO2ART 73.6 (L) 08/04/2018 0750   HCO3 22.8 08/04/2018 0750   ACIDBASEDEF 4.9 (H) 08/04/2018 0750   O2SAT 94.1 08/04/2018 0750     Coagulation Profile: No results for input(s): INR, PROTIME in the last 168 hours.  Cardiac Enzymes: No results for input(s): CKTOTAL, CKMB, CKMBINDEX, TROPONINI in the last 168 hours.  HbA1C: No results found for: HGBA1C  CBG: Recent Labs  Lab 08/05/18 1524 08/05/18 2021 08/05/18 2346 08/06/18 0414 08/06/18 0759  GLUCAP 144* 130* 119* 123* 111*    Critical care time: 40 minutes     Noe Gens, NP-C [place] [facility] Pgr: [phone] or if no answer [phone] 08/06/2018, 9:55 AM

## 2018-08-06 NOTE — Progress Notes (Signed)
[facility] ROUNDING NOTE   Subjective:   This is a 53 year old gentleman diabetes hypertension obstructive sleep apnea on CPAP.  Admitted with  COVID-19.  He has been treated by CCM with Plaquenil azithromycin.  CRRT was initiated 08/02/2018.  Has had some clotting of the filter and required initiation of heparin with dialysis 08/03/2018.  It appears that he is now off pressors.  Pressure appears to be better he is anasarca with massive volume overload.  Continue to remove 100 to 150 cc an hour with CRRT.  Developed atrial fibrillation rapid ventricular rate and started on amiodarone drip.  Blood pressure 120/70 pulse 121 temperature 98.2 O2 sats 91% 40% FiO2   Chest x-ray showed improved aeration of the right lung with persistent pneumonia in the left lung 08/02/2018   CT abd from [facility] on 4/1 - R kidney visualized, no hydro noted, probably there is dye in the renal pelvis  Sodium 133 potassium 3.7 chloride 99 CO2 22 BUN 57 creatinine 3.38 glucose 110 calcium  7.8 magnesium 3.2 albumin 2.3 phosphorus 4.2 WBC 8.8 hemoglobin 11.5 platelets 191 PO2 64.3 O2 sats 90%  Amiodarone drip  Objective:  Vital signs in last 24 hours:  Temp:  [97.8 F (36.6 C)-99.1 F (37.3 C)] 98.2 F (36.8 C) (04/09 0800) Pulse Rate:  [108-143] 117 (04/09 0900) Resp:  [12-39] 15 (04/09 0900) BP: (119-127)/(56-67) 127/63 (04/09 0335) SpO2:  [91 %-94 %] 91 % (04/09 0940) Arterial Line BP: (107-153)/(53-72) 129/59 (04/09 0900) FiO2 (%):  [40 %] 40 % (04/09 0940) Weight:  [133.8 kg] 133.8 kg (04/09 0423)  Weight change: -2.4 kg Filed Weights   08/04/18 0437 08/05/18 0500 08/06/18 0423  Weight: (!) 140.2 kg (!) 136.2 kg 133.8 kg    Intake/Output: I/O last 3 completed shifts: In: 1933.2 [P.O.:100; I.V.:1568.2; NG/GT:215; IV Piggyback:50] Out: 4037 [Urine:4; Emesis/NG output:100; SNKNL:9767]   Intake/Output this shift:  Total I/O In: 203.3 [I.V.:203.3] Out: 182 [Other:182]  Patient not  physically examined in room being positive for covid 19   Basic Metabolic Panel: Recent Labs  Lab 08/01/18 0430  08/03/18 0417  08/04/18 0431 08/04/18 1600 08/05/18 0500 08/05/18 1651 08/06/18 0437  NA 140   < > 136   < > 137 139 137 137 133*  K 4.4   < > 5.1   < > 6.2* 4.8 3.9 3.9 3.7  CL 106   < > 103   < > 101 104 100 101 99  CO2 22   < > 16*   < > 21* 21* _0 GLUCOSE 122*   < > 140*   < > 220* 130* 133* 137* 110*  BUN 54*   < > 68*   < > 64* 58* 55* 51* 57*  CREATININE 3.64*   < > 5.95*   < > 5.51* 4.28* 3.59* 3.23* 3.38*  CALCIUM 7.6*   < > 7.9*   < > 7.7* 7.8* 8.0* 8.1* 7.8*  MG 2.5*  --  2.8*  --  2.9*  --  3.0*  --  3.2*  PHOS 6.3*   < > 7.5*   < > 8.6* 6.2* 5.7* 5.0* 4.2   < > = values in this interval not displayed.    Liver Function Tests: Recent Labs  Lab 07/30/18 1845  08/04/18 0431 08/04/18 1600 08/05/18 0500 08/05/18 1651 08/06/18 0437  AST 60*  --   --   --  114*  --   --   ALT 34  --   --   --  53*  --   --   ALKPHOS 57  --   --   --  66  --   --   BILITOT 1.2  --   --   --  5.4*  --   --   PROT 6.3*  --   --   --  6.3*  --   --   ALBUMIN 2.9*   < > 2.2* 2.5* 2.4* 2.6* 2.3*   < > = values in this interval not displayed.   No results for input(s): LIPASE, AMYLASE in the last 168 hours. No results for input(s): AMMONIA in the last 168 hours.  CBC: Recent Labs  Lab 08/01/18 0430 08/04/18 0431 08/05/18 0500 08/06/18 0437  WBC 3.0* 11.9* 8.3 8.8  HGB 14.8 13.3 11.9* 11.5*  HCT 47.6 43.8 37.7* 37.5*  MCV 94.3 95.0 92.0 93.1  PLT 123* 216 174 191    Cardiac Enzymes: No results for input(s): CKTOTAL, CKMB, CKMBINDEX, TROPONINI in the last 168 hours.  BNP: Invalid input(s): POCBNP  CBG: Recent Labs  Lab 08/05/18 1524 08/05/18 2021 08/05/18 2346 08/06/18 0414 08/06/18 0759  GLUCAP 144* 130* 119* 123* 111*    Microbiology: Results for orders placed or performed during the hospital encounter of 07/30/2018  MRSA PCR Screening      Status: None   Collection Time: 07/30/18  2:30 AM  Result Value Ref Range Status   MRSA by PCR NEGATIVE NEGATIVE Final    Comment:        The GeneXpert MRSA Assay (FDA approved for NASAL specimens only), is one component of a comprehensive MRSA colonization surveillance program. It is not intended to diagnose MRSA infection nor to guide or monitor treatment for MRSA infections. Performed at [facility], [place] [address] [place], [place] [address]     Coagulation Studies: No results for input(s): LABPROT, INR in the last 72 hours.  Urinalysis: No results for input(s): COLORURINE, LABSPEC, PHURINE, GLUCOSEU, HGBUR, BILIRUBINUR, KETONESUR, PROTEINUR, UROBILINOGEN, NITRITE, LEUKOCYTESUR in the last 72 hours.  Invalid input(s): APPERANCEUR    Imaging: No results found.   Medications:   .  prismasol BGK 4/2.5 500 mL/hr at 08/05/18 1101  .  prismasol BGK 4/2.5 500 mL/hr at 08/05/18 1101  . sodium chloride    . amiodarone 30 mg/hr (08/06/18 1000)  . fentaNYL infusion INTRAVENOUS 325 mcg/hr (08/06/18 1000)  . heparin 10,000 units/ 20 mL infusion syringe 450 Units/hr (08/06/18 0917)  . midazolam 6 mg/hr (08/06/18 1000)  . phenylephrine (NEO-SYNEPHRINE) Adult infusion Stopped (08/04/18 1504)  . prismasol BGK 4/2.5 2,000 mL/hr at 08/05/18 2039   . alteplase      . chlorhexidine gluconate (MEDLINE KIT)  15 mL Mouth Rinse BID  . Chlorhexidine Gluconate Cloth  6 each Topical Daily  . Chlorhexidine Gluconate Cloth  6 each Topical Daily  . enoxaparin (LOVENOX) injection  40 mg Subcutaneous QHS  . insulin aspart  0-9 Units Subcutaneous Q4H  . mouth rinse  15 mL Mouth Rinse 10 times per day  . oxyCODONE  5 mg Per Tube Q4H  . pantoprazole (PROTONIX) IV  40 mg Intravenous Q24H  . sodium chloride flush  10-40 mL Intracatheter Q12H   Place/Maintain arterial line **AND** sodium chloride, acetaminophen (TYLENOL) oral liquid 160 mg/5 mL, albuterol, docusate, fentaNYL,  heparin, heparin, hydrALAZINE, lip balm, midazolam, ondansetron **OR** ondansetron (ZOFRAN) IV, sodium chloride, sodium chloride flush  Assessment/ Plan:   Acute respiratory failure.  Suspect ATN possibly contrast related.  Worsening respiratory  acidosis and metabolic acidosis.  CRRT since 08/02/2018.  Appears to be stable  Hyperkalemia resolved continues on CRRT  Pneumonia secondary to COVID-19 on ventilator with high support FiO2 40%.  Continues to pull fluid  Diabetes mellitus per primary team  Obesity and obstructive sleep apnea  History of atrial fibrillation developed atrial fibrillation rapid rate  Electrolytes appear to be stable  Acid-base.  Combined metabolic and respiratory acidosis.  Somewhat improved  Volume massive volume overload we will pull 100 to 150 cc/h  Disposition appears to be stable on vent.  Continue with CRRT for now     LOS: [place] _0 _1 :04 AM

## 2018-08-06 NOTE — Progress Notes (Signed)
Pt CRRT was stopped bc return line pressures continued to be extremely negative. Filter was changed at approximately 0240 d/t clotting warning. Once changed filter pressures continued to read high. After trouble shooting the filter pressures, the return line starting alerting that it was negative. Line was flushed but no blood return. This nurse, the charge nurse and another nurse all tried to trouble shoot. CRRT was able to resume for about 30 min before alarming again and then eventually the filter clotted. CRRT was then discontinued, ports were flushed and then heparin locked and IV team notified to TPA line. CCM and nephrology both notified. Will continue to monitor pt closely this shift.

## 2018-08-06 NOTE — Progress Notes (Addendum)
Nutrition Follow-up  RD working remotely.   DOCUMENTATION CODES:   Morbid obesity  INTERVENTION:  - will order trickle TF: Vital High Protein @ 15 ml/hr with 60 ml prostat BID. This regimen will provide 760 kcal, 91 grams protein, and 301 ml free water.  - goal rate for TF: Vital High Protein @ 65 ml/hr with 60 ml prostat BID. This regimen will provide 1960 kcal (105% estimated kcal need), 196 grams protein, and 1304 ml free water.  - free water flush, if desired, to be per MD/NP.    NUTRITION DIAGNOSIS:   Inadequate oral intake related to inability to eat as evidenced by NPO status. -ongoing  GOAL:   Provide needs based on ASPEN/SCCM guidelines -unmet/unable to meet at this time.   MONITOR:   Vent status, Weight trends, Labs, I & O's, Other (Comment)(initiation of nutrition support)  ASSESSMENT:   53 year-old male admitted on 4/1 with abdominal pain which had been persistent for several days; noted to have diverticulitis at [facility] and was transferred to [facility] for further evaluation. Patient found to be COVID POSITIVE at PCP office earlier in the week. PCCM consulted 4/2 for worsening hypoxemia.   Significant Events: 4/1- admitted 4/2- intubated and OGT placed; proned; triple lumen PICC         placed 4/3- proned 4/5- CRRT initiation 4/6- supine 4/9- CRRT stopped d/t persistent filter clotting, inability to         resolve issue  Current weight consistent with admission (4/1) weight. Patient remains intubated and OGT in place to LIS with flow sheet indicating bile output (minimal). Flow sheet indicates no BM since 4/1. Patient has had no nutrition since admission.   Patient with ARDS d/t COVID-19, AKI, diverticulitis, afib with RVR, metabolic acidosis.  Able to talk with Velna Hatchet and RN caring for patient today. Plan for initiation of trickle TF today and monitor for tolerance and further plan concerning nutrition support.     Patient is currently  intubated on ventilator support MV: 12.8 L/min as of 9:40 AM Temp (24hrs), Avg:98.3 F (36.8 C), Min:97.8 F (36.6 C), Max:99.1 F (37.3 C) Propofol: none  Medications reviewed; sliding scale novolog.  Labs reviewed; CBGs: 123 and 111 mg/dl today, Na: 133 mmol/l, BUN: 57 mg/dl, creatinine: 3.38 mg/dl, Ca: 7.8 mg/dl, Mg: 3.2 mg/dl, GFR: 20 ml/min. Drips; amiodarone @ 30 mg/hr, fentanyl @ 325 mcg/hr, versed @ 6 mg/hr.    Diet Order:   Diet Order            Diet NPO time specified Except for: Ice Chips, Sips with Meds  Diet effective now              EDUCATION NEEDS:   No education needs have been identified at this time  Skin:  Skin Assessment: Reviewed RN Assessment  Last BM:  4/1  Height:   Ht Readings from Last 1 Encounters:  08/05/18 5\' 10"  (1.778 m)    Weight:   Wt Readings from Last 1 Encounters:  08/06/18 133.8 kg    Ideal Body Weight:  75.45 kg  BMI:  Body mass index is 42.32 kg/m.  Estimated Nutritional Needs:   Kcal:  4196-2229 kcal  Protein:  >/= 189 grams  Fluid:  >/= 2.2 L/day     Jarome Matin, MS, RD, LDN, [facility] Inpatient Clinical Dietitian Pager # [phone] After hours/weekend pager # [phone]

## 2018-08-07 ENCOUNTER — Inpatient Hospital Stay (HOSPITAL_COMMUNITY): Payer: BLUE CROSS/BLUE SHIELD

## 2018-08-07 DIAGNOSIS — L8990 Pressure ulcer of unspecified site, unspecified stage: Secondary | ICD-10-CM

## 2018-08-07 LAB — RENAL FUNCTION PANEL
Albumin: 2.7 g/dL — ABNORMAL LOW (ref 3.5–5.0)
Albumin: 2.5 g/dL — ABNORMAL LOW (ref 3.5–5.0)
Anion gap: 15 (ref 5–15)
Anion gap: 12 (ref 5–15)
BUN: 62 mg/dL — ABNORMAL HIGH (ref 6–20)
BUN: 65 mg/dL — ABNORMAL HIGH (ref 6–20)
CO2: 20 mmol/L — ABNORMAL LOW (ref 22–32)
CO2: 21 mmol/L — ABNORMAL LOW (ref 22–32)
Calcium: 8.4 mg/dL — ABNORMAL LOW (ref 8.9–10.3)
Calcium: 8 mg/dL — ABNORMAL LOW (ref 8.9–10.3)
Chloride: 101 mmol/L (ref 98–111)
Chloride: 104 mmol/L (ref 98–111)
Creatinine, Ser: 3.48 mg/dL — ABNORMAL HIGH (ref 0.61–1.24)
Creatinine, Ser: 3.54 mg/dL — ABNORMAL HIGH (ref 0.61–1.24)
GFR calc Af Amer: 22 mL/min — ABNORMAL LOW (ref >60)
GFR calc Af Amer: 22 mL/min — ABNORMAL LOW (ref >60)
GFR calc non Af Amer: 19 mL/min — ABNORMAL LOW (ref >60)
GFR calc non Af Amer: 19 mL/min — ABNORMAL LOW (ref >60)
Glucose, Bld: 151 mg/dL — ABNORMAL HIGH (ref 70–99)
Glucose, Bld: 153 mg/dL — ABNORMAL HIGH (ref 70–99)
Phosphorus: 3.9 mg/dL (ref 2.5–4.6)
Phosphorus: 3.1 mg/dL (ref 2.5–4.6)
Potassium: 4.1 mmol/L (ref 3.5–5.1)
Potassium: 3.9 mmol/L (ref 3.5–5.1)
Sodium: 136 mmol/L (ref 135–145)
Sodium: 137 mmol/L (ref 135–145)

## 2018-08-07 LAB — POCT ACTIVATED CLOTTING TIME
Activated Clotting Time: 169 seconds
Activated Clotting Time: 169 seconds
Activated Clotting Time: 169 seconds
Activated Clotting Time: 175 seconds
Activated Clotting Time: 191 seconds
Activated Clotting Time: 191 seconds
Activated Clotting Time: 197 seconds
Activated Clotting Time: 202 seconds
Activated Clotting Time: 208 seconds
Activated Clotting Time: 197 seconds
Activated Clotting Time: 147 seconds
Activated Clotting Time: 180 seconds
Activated Clotting Time: 180 seconds
Activated Clotting Time: 186 seconds
Activated Clotting Time: 191 seconds
Activated Clotting Time: 191 seconds
Activated Clotting Time: 191 seconds
Activated Clotting Time: 202 seconds
Activated Clotting Time: 180 seconds
Activated Clotting Time: 202 seconds

## 2018-08-07 LAB — BLOOD GAS, ARTERIAL
Acid-base deficit: 3.3 mmol/L — ABNORMAL HIGH (ref 0.0–2.0)
Bicarbonate: 21.6 mmol/L (ref 20.0–28.0)
Drawn by: 232811
FIO2: 40
MECHVT: 510 mL
O2 Saturation: 92.1 %
PEEP: 15 cmH2O
Patient temperature: 99.3
RATE: 35 resp/min
pCO2 arterial: 41.2 mmHg (ref 32.0–48.0)
pH, Arterial: 7.341 — ABNORMAL LOW (ref 7.350–7.450)
pO2, Arterial: 70 mmHg — ABNORMAL LOW (ref 83.0–108.0)

## 2018-08-07 LAB — CBC
HCT: 38.9 % — ABNORMAL LOW (ref 39.0–52.0)
Hemoglobin: 12.3 g/dL — ABNORMAL LOW (ref 13.0–17.0)
MCH: 29.4 pg (ref 26.0–34.0)
MCHC: 31.6 g/dL (ref 30.0–36.0)
MCV: 92.8 fL (ref 80.0–100.0)
Platelets: 212 10*3/uL (ref 150–400)
RBC: 4.19 MIL/uL — ABNORMAL LOW (ref 4.22–5.81)
RDW: 18.3 % — ABNORMAL HIGH (ref 11.5–15.5)
WBC: 10.9 10*3/uL — ABNORMAL HIGH (ref 4.0–10.5)
nRBC: 5.3 % — ABNORMAL HIGH (ref 0.0–0.2)

## 2018-08-07 LAB — GLUCOSE, CAPILLARY
Glucose-Capillary: 108 mg/dL — ABNORMAL HIGH (ref 70–99)
Glucose-Capillary: 123 mg/dL — ABNORMAL HIGH (ref 70–99)
Glucose-Capillary: 125 mg/dL — ABNORMAL HIGH (ref 70–99)
Glucose-Capillary: 127 mg/dL — ABNORMAL HIGH (ref 70–99)
Glucose-Capillary: 133 mg/dL — ABNORMAL HIGH (ref 70–99)
Glucose-Capillary: 155 mg/dL — ABNORMAL HIGH (ref 70–99)

## 2018-08-07 LAB — APTT: aPTT: 37 seconds — ABNORMAL HIGH (ref 24–36)

## 2018-08-07 LAB — MAGNESIUM: Magnesium: 3 mg/dL — ABNORMAL HIGH (ref 1.7–2.4)

## 2018-08-07 MED ORDER — SODIUM CHLORIDE 0.9 % IV SOLN
0.0000 ug/min | INTRAVENOUS | Status: DC
  Administered 2018-08-07: 23:00:00 135 ug/min via INTRAVENOUS
  Administered 2018-08-08: 150 ug/min via INTRAVENOUS
  Administered 2018-08-08: 130 ug/min via INTRAVENOUS
  Administered 2018-08-08: 13:00:00 140 ug/min via INTRAVENOUS
  Administered 2018-08-09 (×2): 120 ug/min via INTRAVENOUS
  Administered 2018-08-09 – 2018-08-10 (×2): 100 ug/min via INTRAVENOUS
  Filled 2018-08-07: qty 40
  Filled 2018-08-07: qty 4
  Filled 2018-08-07: qty 40
  Filled 2018-08-07: qty 4
  Filled 2018-08-07 (×2): qty 40
  Filled 2018-08-07 (×4): qty 4
  Filled 2018-08-07: qty 40
  Filled 2018-08-07 (×2): qty 4

## 2018-08-07 MED ORDER — DEXMEDETOMIDINE HCL IN NACL 200 MCG/50ML IV SOLN
0.4000 ug/kg/h | INTRAVENOUS | Status: DC
  Administered 2018-08-07: 05:00:00 0.7 ug/kg/h via INTRAVENOUS
  Administered 2018-08-07: 0.4 ug/kg/h via INTRAVENOUS
  Filled 2018-08-07 (×4): qty 50

## 2018-08-07 MED ORDER — ALBUMIN HUMAN 25 % IV SOLN
12.5000 g | Freq: Once | INTRAVENOUS | Status: AC
  Administered 2018-08-07: 12.5 g via INTRAVENOUS
  Filled 2018-08-07: qty 50

## 2018-08-07 MED ORDER — VITAL HIGH PROTEIN PO LIQD
1000.0000 mL | ORAL | Status: DC
  Administered 2018-08-07 – 2018-08-08 (×2): 1000 mL

## 2018-08-07 MED ORDER — SODIUM CHLORIDE 0.9 % IV BOLUS
500.0000 mL | Freq: Once | INTRAVENOUS | Status: AC
  Administered 2018-08-07: 500 mL via INTRAVENOUS

## 2018-08-07 MED ORDER — DEXMEDETOMIDINE HCL IN NACL 400 MCG/100ML IV SOLN
0.4000 ug/kg/h | INTRAVENOUS | Status: DC
  Administered 2018-08-07 – 2018-08-08 (×6): 0.6 ug/kg/h via INTRAVENOUS
  Administered 2018-08-09 (×2): 0.7 ug/kg/h via INTRAVENOUS
  Administered 2018-08-09 (×2): 0.6 ug/kg/h via INTRAVENOUS
  Administered 2018-08-10: 10:00:00 0.3 ug/kg/h via INTRAVENOUS
  Administered 2018-08-10: 0.9 ug/kg/h via INTRAVENOUS
  Administered 2018-08-10: 1.2 ug/kg/h via INTRAVENOUS
  Filled 2018-08-07 (×16): qty 100

## 2018-08-07 NOTE — Progress Notes (Addendum)
Advised MD regarding CXR result. No new orders for vent. Pt continues to be asynchronous with vent, HR now 125-135. Asked MD about amio bolus, pt still on 30mg  amio gtt. MD ordered precedex gtt to be started for vent synchrony and lower HR.   Continuing to monitor.

## 2018-08-07 NOTE — Progress Notes (Signed)
eLink Physician-Brief Progress Note Patient Name: [name] Long DOB: 16-Aug-1965 MRN: [id number]   Date of Service  08/07/2018  HPI/Events of Note  Notified of hypotension and tachycardia [name] 35/510/40%/15 PEEP. Negative 1.4 L via CRRT  eICU Interventions   Ordered ABG  Decreased PEEP to 12 from 15  NS 500 cc bolus     Intervention Category Major Interventions: Hypotension - evaluation and management;Arrhythmia - evaluation and management  Judd Lien 08/07/2018, 5:35 AM

## 2018-08-07 NOTE — Progress Notes (Signed)
NAME:  [name] Long, MRN:  [id number], DOB:  October 10, 1965, LOS: 9 ADMISSION DATE:  08/16/2018, CONSULTATION DATE:  07/30/2018 REFERRING MD:  07/30/2018, CHIEF COMPLAINT:   Brief History   53 y/o male admitted on 4/1 with abdominal pain which had been persistent for several days.  COVID POSITIVE.  PCCM consulted 4/2 for worsening hypoxemia.   He was noted to be COVID positive by a PCP check earlier in the week.  He went to [facility] with abdominal pain, noted to have diverticulitis, was trasnferred to [facility] for further evaluation.    Past Medical History  DM2, HTN, Former smoker, OSA  [place] [facility] Events   4/01 admission for diverticulitis 4/02 PCCM consulted for worsening dyspnea, hypoxemia 4/02 Proned  4/03 Proned  4/05 Improved oxygenation however ABG with respiratory and metabolic acidosis. Poor UOP 4/06 Supine 4/07 CVVHD, difficulty with filters clotting  4/08 CVVHD ongoing, AFwRVR afternoon of 4/8, started on amio gtt    Consults:  4/2 PCCM  Procedures:  ETT 4/2 >>  L IJ HD 4/6 >> R Rad Aline 4/6 >>   Significant Diagnostic Tests:    Micro Data:  3/30 COVID 19 test positive (outside PCP)  Antimicrobials:  Zosyn 4/1 >> 4/7  Interim history/subjective:  Afebrile.  Keeping even on CVVHD.  RN reports lightening sedation.  Remaons on versed at 4, fentanyl 325, amiodarone 30mg , neo at 100.   Objective   Blood pressure (!) 100/56, pulse 82, temperature 99.5 F (37.5 C), resp. rate (!) 25, height 5\' 10"  (1.778 m), weight 131 kg, SpO2 (!) 89 %. CVP:  [6 mmHg-85 mmHg] 85 mmHg  Vent Mode: PRVC FiO2 (%):  [40 %] 40 % Set Rate:  [35 bmp] 35 bmp Vt Set:  [510 mL] 510 mL PEEP:  [10 cmH20-15 cmH20] 10 cmH20 Plateau Pressure:  [26 cmH20-31 cmH20] 26 cmH20   Intake/Output Summary (Last 24 hours) at 08/07/2018 1337 Last data filed at 08/07/2018 1300 Gross per 24 hour  Intake 3113.16 ml  Output 4060 ml  Net -946.84 ml   Filed Weights   08/05/18 0500 08/06/18 0423  08/07/18 0500  Weight: (!) 136.2 kg 133.8 kg 131 kg   Physical Exam: General: adult male lying in bed on vent, critically ill appearing  HEENT: MM pink/moist, ETT, abrasions to face Neuro: sedate CV: s1s2 rrr, no m/r/g PULM: even/non-labored, lungs bilaterally clear anterior  YQ:IHKV, non-tender, bsx4 active  Extremities: warm/dry, 1+ generalized edema  Skin: no rashes or lesions  Resolved Hospital Problem list   Mixed Acidosis   Assessment & Plan:   Diverticulitis P: Monitor off abx  Follow fever curve / WBC trend  Acute Respiratory Failure with Hypoxemia  -paralytics + prone 4/2-4/5 P: 7cc/kg, low Vt ventilation Wean PEEP / FiO2 for sats >90% Follow CXR intermittently  Reduce ceiling of versed  Continue precedex to wean fentanyl / versed  Oxycodone 5mg  Q4  Continue klonopin   SARS COV Pneumonia -completed plaquenil, abx -not a candidate for tocilizumab due to diverticulitis  P: Supportive care   Hypotension  -suspect related to volume removal with HD P: Albumin x1  Keep even with HD   AF with RVR  P: Continue amiodarone gtt Tele monitoring   AKI with Metabolic Acidosis  P: Trend BMP / urinary output Replace electrolytes as indicated Avoid nephrotoxic agents, ensure adequate renal perfusion Keeping even with HD due to hypotension   DM2 P: SSI  OSA P: Monitor / intubated   Best practice:  Diet:  tube feeding Pain/Anxiety/Delirium protocol (if indicated): yes, RASS -2 to -3 VAP protocol (if indicated): yes DVT prophylaxis: sub q heparin GI prophylaxis: pantoprazole Glucose control: SSI Mobility: bed rest Code Status: Limited code blue established / no CPR.  Family Communication: Wife updated via phone 4/10.  He may ultimately need trach (have spoke to Dr. Nelda Marseille, possible next week) if he is making progress.  May run into difficulty with disposition if he needs trach / HD for recovery. Disposition: ICU  Labs   CBC: Recent Labs  Lab  08/01/18 0430 08/04/18 0431 08/05/18 0500 08/06/18 0437 08/07/18 0346  WBC 3.0* 11.9* 8.3 8.8 10.9*  HGB 14.8 13.3 11.9* 11.5* 12.3*  HCT 47.6 43.8 37.7* 37.5* 38.9*  MCV 94.3 95.0 92.0 93.1 92.8  PLT 123* 216 174 191 683    Basic Metabolic Panel: Recent Labs  Lab 08/03/18 0417  08/04/18 0431  08/05/18 0500 08/05/18 1651 08/06/18 0437 08/06/18 1541 08/07/18 0346  NA 136   < > 137   < > 137 137 133* 136 136  K 5.1   < > 6.2*   < > 3.9 3.9 3.7 3.7 4.1  CL 103   < > 101   < > 100 101 99 103 101  CO2 16*   < > 21*   < > 24 24 22 22  20*  GLUCOSE 140*   < > 220*   < > 133* 137* 110* 146* 151*  BUN 68*   < > 64*   < > 55* 51* 57* 60* 62*  CREATININE 5.95*   < > 5.51*   < > 3.59* 3.23* 3.38* 3.26* 3.48*  CALCIUM 7.9*   < > 7.7*   < > 8.0* 8.1* 7.8* 7.8* 8.4*  MG 2.8*  --  2.9*  --  3.0*  --  3.2*  --  3.0*  PHOS 7.5*   < > 8.6*   < > 5.7* 5.0* 4.2 3.6 3.9   < > = values in this interval not displayed.   GFR: Estimated Creatinine Clearance: 33.8 mL/min (A) (by C-G formula based on SCr of 3.48 mg/dL (H)). Recent Labs  Lab 08/04/18 0431 08/05/18 0500 08/06/18 0437 08/07/18 0346  WBC 11.9* 8.3 8.8 10.9*    Liver Function Tests: Recent Labs  Lab 08/05/18 0500 08/05/18 1651 08/06/18 0437 08/06/18 1541 08/07/18 0346  AST 114*  --   --   --   --   ALT 53*  --   --   --   --   ALKPHOS 66  --   --   --   --   BILITOT 5.4*  --   --   --   --   PROT 6.3*  --   --   --   --   ALBUMIN 2.4* 2.6* 2.3* 2.2* 2.7*   No results for input(s): LIPASE, AMYLASE in the last 168 hours. No results for input(s): AMMONIA in the last 168 hours.  ABG    Component Value Date/Time   PHART 7.341 (L) 08/07/2018 0525   PCO2ART 41.2 08/07/2018 0525   PO2ART 70.0 (L) 08/07/2018 0525   HCO3 21.6 08/07/2018 0525   ACIDBASEDEF 3.3 (H) 08/07/2018 0525   O2SAT 92.1 08/07/2018 0525     Coagulation Profile: No results for input(s): INR, PROTIME in the last 168 hours.  Cardiac Enzymes: No  results for input(s): CKTOTAL, CKMB, CKMBINDEX, TROPONINI in the last 168 hours.  HbA1C: No results found for: HGBA1C  CBG: Recent  Labs  Lab 08/06/18 1548 08/06/18 2034 08/07/18 0005 08/07/18 0404 08/07/18 0807  GLUCAP 133* 128* 127* 155* 123*    Critical care time: 40 minutes     Noe Gens, NP-C [facility] Pgr: [phone] or if no answer [phone] 08/07/2018, 1:37 PM

## 2018-08-07 NOTE — Progress Notes (Signed)
Per CCM order- ETT advanced 2 cm (now resides at 27cm lip). Procedure has 2 RTs present and was uneventful. RN aware.

## 2018-08-07 NOTE — Progress Notes (Addendum)
RN having issue with CRRT machine. "Return pressure high/ too positive" alarm continuously stopping CRRT from running. As a result, filter quickly clotted after having recently been changed. I paged Dr. Justin Mend and asked if we could prime the new filter with heparin to help the new filter last longer as we anticipated to continue to have the issue of return pressure being "to high/positive" for CRRT to run. Dr. Justin Mend verbally confirmed that RN could prime CRRT machine with heparin.   HD catheter dwelled with Heparin, per order, during filter change. New filter primed with heparin, per conversation and verbal order from Dr. Justin Mend. When CRRT was restarted, we continued to have issue with the return pressure being high. Attempts were made to slow blood flow rate and flush catheter. I notified Dr. Jimmy Footman with [facility], that despite troubleshooting, I felt it was an issue with the HD catheter. Please see progress note by Dr. Jimmy Footman.

## 2018-08-07 NOTE — Progress Notes (Signed)
Pt noted for severe skin breakdown on cheek areas of his face.  RN witnessed when assisting with tube holder change.  Foam dressing applied.  ETT advanced 1cm from 25cm lip to 26cm lip due to cuff leak.  Pt tolerated well without incident.  ELink MD notified of change and has ordered repeat cxr.  MD also was notified of some vent dissynchrony, no new vent changes/orders given at this time.  RN aware.

## 2018-08-07 NOTE — Progress Notes (Signed)
eLink Physician-Brief Progress Note Patient Name: [name] Long DOB: April 22, 1966 MRN: [id number]   Date of Service  08/07/2018  HPI/Events of Note  CRRT catheter malfunction with high pressures.  Nurse attempted to switch lines without success.  Concerned catheter is up against vessel wall.  Potassium is WNL and pH is adequate  eICU Interventions  Plan: Continue to run CRRT until malfunction. When this happens, hold CRRT for now with heparin dwell. If PCCM over to [place] will have them address catheter otherwise address in AM     Intervention Category Major Interventions: Other:  Ayvin Lipinski 08/07/2018, 10:15 PM

## 2018-08-07 NOTE — Progress Notes (Signed)
[facility] ROUNDING NOTE   Subjective:   This is a 53 year old gentleman diabetes hypertension obstructive sleep apnea on CPAP.  Admitted with  COVID-19.  He has been treated by CCM with Plaquenil azithromycin.  CRRT was initiated 08/02/2018.  Has had some clotting of the filter and required initiation of heparin with dialysis 08/03/2018.  He has been kept even.  Has been placed back on pressors.  Being kept even on CRRT no much change.  Have attempted to pull 100 to 150 cc an hour with little success.   Blood pressure 101/41 pulse 72 temperature 99.5 O2 sats 93% FiO2 40%.  Chest x-ray showed improved aeration of the right lung with persistent pneumonia in the left lung 08/02/2018   CT abd from [facility] on 4/1 - R kidney visualized, no hydro noted, probably there is dye in the renal pelvis  Sodium 136 potassium 4.1 chloride 101 CO2 20 BUN 62 creatinine 3.48 glucose 151 phosphorus 3.9 magnesium 3.0 albumin 2.7   pH 7.236 PO2 73.6 PCO2 55.7   Protonix 40 mg daily, phenylephrine IV, amiodarone IV  Objective:  Vital signs in last 24 hours:  Temp:  [96.5 F (35.8 C)-99.5 F (37.5 C)] 99.5 F (37.5 C) (04/10 1200) Pulse Rate:  [82-132] 82 (04/10 1300) Resp:  [13-41] 25 (04/10 1300) BP: (100-115)/(56-59) 100/56 (04/10 0349) SpO2:  [88 %-95 %] 89 % (04/10 1300) Arterial Line BP: (82-125)/(40-63) 101/41 (04/10 1300) FiO2 (%):  [40 %] 40 % (04/10 0905) Weight:  [131 kg] 131 kg (04/10 0500)  Weight change: -2.8 kg Filed Weights   08/05/18 0500 08/06/18 0423 08/07/18 0500  Weight: (!) 136.2 kg 133.8 kg 131 kg    Intake/Output: I/O last 3 completed shifts: In: 3384.4 [P.O.:10; I.V.:2321.4; NG/GT:553; IV Piggyback:500] Out: 1610 [Emesis/NG output:100; RUEAV:4098]   Intake/Output this shift:  Total I/O In: 1340.6 [I.V.:1057.9; NG/GT:282.7] Out: 502 [Other:502]  Patient not physically examined in room being positive for covid 19   Basic Metabolic Panel: Recent Labs  Lab  08/03/18 0417  08/04/18 0431  08/05/18 0500 08/05/18 1651 08/06/18 0437 08/06/18 1541 08/07/18 0346  NA 136   < > 137   < > 137 137 133* 136 136  K 5.1   < > 6.2*   < > 3.9 3.9 3.7 3.7 4.1  CL 103   < > 101   < > 100 101 99 103 101  CO2 16*   < > 21*   < > '24 24 22 22 ' 20*  GLUCOSE 140*   < > 220*   < > 133* 137* 110* 146* 151*  BUN 68*   < > 64*   < > 55* 51* 57* 60* 62*  CREATININE 5.95*   < > 5.51*   < > 3.59* 3.23* 3.38* 3.26* 3.48*  CALCIUM 7.9*   < > 7.7*   < > 8.0* 8.1* 7.8* 7.8* 8.4*  MG 2.8*  --  2.9*  --  3.0*  --  3.2*  --  3.0*  PHOS 7.5*   < > 8.6*   < > 5.7* 5.0* 4.2 3.6 3.9   < > = values in this interval not displayed.    Liver Function Tests: Recent Labs  Lab 08/05/18 0500 08/05/18 1651 08/06/18 0437 08/06/18 1541 08/07/18 0346  AST 114*  --   --   --   --   ALT 53*  --   --   --   --   ALKPHOS 66  --   --   --   --  BILITOT 5.4*  --   --   --   --   PROT 6.3*  --   --   --   --   ALBUMIN 2.4* 2.6* 2.3* 2.2* 2.7*   No results for input(s): LIPASE, AMYLASE in the last 168 hours. No results for input(s): AMMONIA in the last 168 hours.  CBC: Recent Labs  Lab 08/01/18 0430 08/04/18 0431 08/05/18 0500 08/06/18 0437 08/07/18 0346  WBC 3.0* 11.9* 8.3 8.8 10.9*  HGB 14.8 13.3 11.9* 11.5* 12.3*  HCT 47.6 43.8 37.7* 37.5* 38.9*  MCV 94.3 95.0 92.0 93.1 92.8  PLT 123* 216 174 191 212    Cardiac Enzymes: No results for input(s): CKTOTAL, CKMB, CKMBINDEX, TROPONINI in the last 168 hours.  BNP: Invalid input(s): POCBNP  CBG: Recent Labs  Lab 08/06/18 1548 08/06/18 2034 08/07/18 0005 08/07/18 0404 08/07/18 0807  GLUCAP 133* 128* 127* 155* 13*    Microbiology: Results for orders placed or performed during the hospital encounter of 08/19/2018  MRSA PCR Screening     Status: None   Collection Time: 07/30/18  2:30 AM  Result Value Ref Range Status   MRSA by PCR NEGATIVE NEGATIVE Final    Comment:        The GeneXpert MRSA Assay (FDA approved  for NASAL specimens only), is one component of a comprehensive MRSA colonization surveillance program. It is not intended to diagnose MRSA infection nor to guide or monitor treatment for MRSA infections. Performed at [facility], [place] [address] [place], [place] [address]     Coagulation Studies: No results for input(s): LABPROT, INR in the last 72 hours.  Urinalysis: No results for input(s): COLORURINE, LABSPEC, PHURINE, GLUCOSEU, HGBUR, BILIRUBINUR, KETONESUR, PROTEINUR, UROBILINOGEN, NITRITE, LEUKOCYTESUR in the last 72 hours.  Invalid input(s): APPERANCEUR    Imaging: Dg Chest Port 1 View  Result Date: 08/07/2018 CLINICAL DATA:  Respiratory distress. EXAM: PORTABLE CHEST 1 VIEW COMPARISON:  Radiograph 08/02/2018, CT 08/24/2018 FINDINGS: Endotracheal tube is not well visualized, tip tentatively identified above the thoracic inlet. Left internal jugular central line tip projects over the distal brachiocephalic vein. Left upper extremity PICC tip projects over the brachiocephalic/SVC confluence. Cardiomegaly is unchanged. Worsening patchy opacity at the right lung base. Unchanged retrocardiac opacity with improved aeration of the left mid lung. No pneumothorax. IMPRESSION: 1. Worsening patchy opacity at the right lung base. 2. Unchanged retrocardiac opacity and cardiomegaly. 3. Endotracheal tube not well seen, tip possibly above the thoracic inlet. Electronically Signed   By: Keith Rake M.D.   On: 08/07/2018 03:30     Medications:   .  prismasol BGK 4/2.5 500 mL/hr at 08/07/18 0800  .  prismasol BGK 4/2.5 500 mL/hr at 08/07/18 0800  . sodium chloride    . amiodarone 30 mg/hr (08/07/18 1400)  . dexmedetomidine (PRECEDEX) IV infusion 1 mcg/kg/hr (08/07/18 1302)  . feeding supplement (VITAL HIGH PROTEIN) 35 mL/hr at 08/07/18 1104  . fentaNYL infusion INTRAVENOUS 250 mcg/hr (08/07/18 1400)  . heparin 10,000 units/ 20 mL infusion syringe 1,700 Units/hr  (08/07/18 1015)  . midazolam 0 mg/hr (08/07/18 1413)  . phenylephrine (NEO-SYNEPHRINE) Adult infusion 120 mcg/min (08/07/18 1400)  . prismasol BGK 4/2.5 2,000 mL/hr at 08/07/18 1015   . chlorhexidine gluconate (MEDLINE KIT)  15 mL Mouth Rinse BID  . Chlorhexidine Gluconate Cloth  6 each Topical Daily  . Chlorhexidine Gluconate Cloth  6 each Topical Daily  . clonazePAM  1 mg Oral BID  . enoxaparin (LOVENOX) injection  40 mg Subcutaneous QHS  . feeding supplement (PRO-STAT SUGAR FREE 64)  60 mL Per Tube BID  . insulin aspart  0-9 Units Subcutaneous Q4H  . mouth rinse  15 mL Mouth Rinse 10 times per day  . oxyCODONE  5 mg Per Tube Q4H  . pantoprazole (PROTONIX) IV  40 mg Intravenous Q24H  . sodium chloride flush  10-40 mL Intracatheter Q12H   Place/Maintain arterial line **AND** sodium chloride, acetaminophen (TYLENOL) oral liquid 160 mg/5 mL, albuterol, docusate, fentaNYL, heparin, heparin, hydrALAZINE, lip balm, midazolam, ondansetron **OR** ondansetron (ZOFRAN) IV, sodium chloride, sodium chloride flush  Assessment/ Plan:   Acute respiratory failure.  Suspect ATN possibly contrast related.  Worsening respiratory acidosis and metabolic acidosis CRRT initiated.  Metabolic and acid-base parameters appear to be corrected.  Hyperkalemia appears to have resolved.  Pneumonia secondary to COVID-19 on ventilator with high support  Diabetes mellitus per primary team  Obesity and obstructive sleep apnea  History of atrial fibrillation patient now back on IV amiodarone  Electrolytes appear to be stable  Acid-base.  Combined metabolic and respiratory acidosis.  Somewhat improved  Volume being kept even at this present time.  We have been attempting to pull fluid but having little success.  Requiring increased pressor support  Disposition appears a little progress is being made at this point continues to be oligo anuric.  Suggest withdrawal of care if no improvement seen within the next 24  to 48 hours.     LOS: [place] '@TODAY' '@2' :39 PM

## 2018-08-07 NOTE — Progress Notes (Addendum)
eLink Physician-Brief Progress Note Patient Name: [name] Long DOB: 31-Mar-1966 MRN: [id number]   Date of Service  08/07/2018  HPI/Events of Note  Notified of desynchrony on the vent. On AC 35/510 (7cc/kg)/40%15 PEEP. RT suctioned and repositioned ETT. Sedated on Versed, Fentanyl. On amio drip for afib RVR  eICU Interventions   Ordered to repeat CXR  Continue to titrate sedation, may need to add another sedative     Intervention Category Major Interventions: Respiratory failure - evaluation and management  Shona Needles Donzella Carrol 08/07/2018, 2:27 AM

## 2018-08-07 NOTE — Progress Notes (Signed)
ACT therapeutic x3 with heparin through CRRT. Per order, check ACT q4h now.

## 2018-08-07 NOTE — Progress Notes (Signed)
RT called to the room for pt having low volumes on vent. Unable to advance suction catheter and cuff leak heard. Replaced tube holder with assistance of RT and provided skin care to patient. Bilateral pressure injuries noted underneath tube holder. Cleansed with water and foam dressings placed bilaterally.   RT called MD regarding pt asynchrony with vent despite increased sedation. RT advanced ETT 1cm with some improvement in cuff leak, now at 26. Informed MD, MD ordered STAT CXR. This RN also discussed with MD pt HR steadily increasing throughout shift. HR 110-120 at beginning of shift, now 120-130 still in afib. Pt on amio gtt @30 . CVP also increased from 8 at beginning of shift, now 14 despite pulling fluid on CRRT.   RN to call MD back after CXR result to guide therapy. Continuing to monitor closely.

## 2018-08-07 NOTE — Progress Notes (Addendum)
Pt became hypotensive SBP to the 70's. HR 130's in afib. CRRT pt fluid removal rate turned to 0, versed gtt decreased. Called Elink, orders given for STAT ABG, PEEP decreased to 12. RT notified. No other changes made to vent per ABG. FOR BP, MD ordered 500 mL fluid bolus. Continuing to monitor closely.   Pt anuric for 2 days now. Foley removed per protocol.

## 2018-08-07 NOTE — Progress Notes (Signed)
Nutrition Follow-up  RD working remotely.   DOCUMENTATION CODES:   Morbid obesity  INTERVENTION:  - will increase TF: Vital High Protein @ 35 ml/hr with 60 ml prostat BID. This regimen will provide 1240 kcal, 133 grams protein, and 702 ml free water. - advance Vital High Protein by 10 ml every 8 hours to reach goal rate of 65 ml/hr.  - goal rate for TF: Vital High Protein @ 65 ml/hr with 60 ml prostat BID. This regimen will provide 1960 kcal (105% estimated kcal need), 196 grams protein, and 1304 ml free water.  - free water flush, if desired, to be per MD/NP.   NUTRITION DIAGNOSIS:   Inadequate oral intake related to inability to eat as evidenced by NPO status. -ongoing  GOAL:   Provide needs based on ASPEN/SCCM guidelines -unmet with current TF regimen   MONITOR:   Vent status, TF tolerance, Labs, Weight trends, Skin  ASSESSMENT:   53 year-old male admitted on 4/1 with abdominal pain which had been persistent for several days; noted to have diverticulitis at [facility] and was transferred to [facility] for further evaluation. Patient found to be COVID POSITIVE at PCP office earlier in the week. PCCM consulted 4/2 for worsening hypoxemia.   Significant Events: 4/1- admitted 4/2- intubated and OGT placed; proned; triple lumen PICC         placed 4/3- proned 4/5- CRRT initiation 4/6- supine 4/9- CRRT stopped d/t persistent filter clotting, inability to         resolve issue; trickle rate TF initiated; CRRT re-started   Weight trending down and now -2.8 kg compared to admission (4/1) weight. Patient remains intubated with OGT in place and is receiving Vital High Protein @ 15 ml/hr with 60 ml Prostat BID. This regimen is providing 760 kcal, 91 grams protein, and 301 ml free water.   Spoke with RN earlier this AM who reports that patient continues without BM since 4/1 but no abdominal distention and seems to be tolerating TF without issue. She reports no bowel sounds  earlier this AM. Spoke with Brandi who states okay to advance TF toward goal.    Patient is currently intubated on ventilator support MV: 14.9 L/min Temp (24hrs), Avg:97.4 F (36.3 C), Min:96.5 F (35.8 C), Max:99.4 F (37.4 C)  Medications reviewed; sliding scale novolog. Labs reviewed; CBGs: 127, 155, and 123 mg/dl today, BUN: 62 mg/dl, creatinine: 3.48 mg/dl, Ca: 8.4 mg/dl, Mg: 3 mg/dl, GFR: 19 ml/min.  Drips; amiodarone @ 30 mg/hr, precedex @ 0.4 mcg/kg/hr, fentanyl @ 325 mcg/hr, versed @ 3 mg/hr, neo @ 120 mcg/hr.   Diet Order:   Diet Order            Diet NPO time specified Except for: Ice Chips, Sips with Meds  Diet effective now              EDUCATION NEEDS:   No education needs have been identified at this time  Skin:  Skin Assessment: Skin Integrity Issues: Skin Integrity Issues:: Stage II Stage II: bilateral face (new 4/10)  Last BM:  4/1  Height:   Ht Readings from Last 1 Encounters:  08/05/18 5\' 10"  (1.778 m)    Weight:   Wt Readings from Last 1 Encounters:  08/07/18 131 kg    Ideal Body Weight:  75.45 kg  BMI:  Body mass index is 41.44 kg/m.  Estimated Nutritional Needs:   Kcal:  0626-9485 kcal  Protein:  >/= 189 grams  Fluid:  >/=  2.2 L/day     [name] Matin, MS, RD, LDN, [facility] Inpatient Clinical Dietitian Pager # [phone] After hours/weekend pager # [phone]

## 2018-08-07 NOTE — Progress Notes (Signed)
eLink Physician-Brief Progress Note Patient Name: [name] Long DOB: 07-31-1965 MRN: [id number]   Date of Service  08/07/2018  HPI/Events of Note  ET had to be repositioned given CXR findings. Leak improved but still desynchronized on vent. HR 120s afib  eICU Interventions  Ordered to start Precedex to aid in vent synchrony as well as HR control        Raquel Sarna T Leeta Grimme 08/07/2018, 3:55 AM

## 2018-08-08 ENCOUNTER — Inpatient Hospital Stay (HOSPITAL_COMMUNITY): Payer: BLUE CROSS/BLUE SHIELD

## 2018-08-08 DIAGNOSIS — N179 Acute kidney failure, unspecified: Secondary | ICD-10-CM

## 2018-08-08 LAB — GLUCOSE, CAPILLARY
Glucose-Capillary: 107 mg/dL — ABNORMAL HIGH (ref 70–99)
Glucose-Capillary: 131 mg/dL — ABNORMAL HIGH (ref 70–99)
Glucose-Capillary: 133 mg/dL — ABNORMAL HIGH (ref 70–99)
Glucose-Capillary: 149 mg/dL — ABNORMAL HIGH (ref 70–99)
Glucose-Capillary: 153 mg/dL — ABNORMAL HIGH (ref 70–99)
Glucose-Capillary: 132 mg/dL — ABNORMAL HIGH (ref 70–99)

## 2018-08-08 LAB — POCT I-STAT EG7
Acid-base deficit: 1 mmol/L (ref 0.0–2.0)
Acid-base deficit: 2 mmol/L (ref 0.0–2.0)
Acid-base deficit: 3 mmol/L — ABNORMAL HIGH (ref 0.0–2.0)
Bicarbonate: 28.2 mmol/L — ABNORMAL HIGH (ref 20.0–28.0)
Bicarbonate: 25.1 mmol/L (ref 20.0–28.0)
Bicarbonate: 25.7 mmol/L (ref 20.0–28.0)
Calcium, Ion: 1.12 mmol/L — ABNORMAL LOW (ref 1.15–1.40)
Calcium, Ion: 0.51 mmol/L — CL (ref 1.15–1.40)
Calcium, Ion: 0.58 mmol/L — CL (ref 1.15–1.40)
HCT: 39 % (ref 39.0–52.0)
HCT: 38 % — ABNORMAL LOW (ref 39.0–52.0)
HCT: 37 % — ABNORMAL LOW (ref 39.0–52.0)
Hemoglobin: 13.3 g/dL (ref 13.0–17.0)
Hemoglobin: 12.9 g/dL — ABNORMAL LOW (ref 13.0–17.0)
Hemoglobin: 12.6 g/dL — ABNORMAL LOW (ref 13.0–17.0)
O2 Saturation: 60 %
O2 Saturation: 59 %
O2 Saturation: 59 %
Patient temperature: 100
Patient temperature: 97.7
Patient temperature: 98
Potassium: 4.3 mmol/L (ref 3.5–5.1)
Potassium: 4.2 mmol/L (ref 3.5–5.1)
Potassium: 4.1 mmol/L (ref 3.5–5.1)
Sodium: 137 mmol/L (ref 135–145)
Sodium: 139 mmol/L (ref 135–145)
Sodium: 138 mmol/L (ref 135–145)
TCO2: 30 mmol/L (ref 22–32)
TCO2: 27 mmol/L (ref 22–32)
TCO2: 27 mmol/L (ref 22–32)
pCO2, Ven: 68 mmHg — ABNORMAL HIGH (ref 44.0–60.0)
pCO2, Ven: 52.4 mmHg (ref 44.0–60.0)
pCO2, Ven: 59.4 mmHg (ref 44.0–60.0)
pH, Ven: 7.23 — ABNORMAL LOW (ref 7.250–7.430)
pH, Ven: 7.285 (ref 7.250–7.430)
pH, Ven: 7.242 — ABNORMAL LOW (ref 7.250–7.430)
pO2, Ven: 40 mmHg (ref 32.0–45.0)
pO2, Ven: 34 mmHg (ref 32.0–45.0)
pO2, Ven: 36 mmHg (ref 32.0–45.0)

## 2018-08-08 LAB — COMPREHENSIVE METABOLIC PANEL
ALT: 34 U/L (ref 0–44)
AST: 61 U/L — ABNORMAL HIGH (ref 15–41)
Albumin: 2 g/dL — ABNORMAL LOW (ref 3.5–5.0)
Alkaline Phosphatase: 82 U/L (ref 38–126)
Anion gap: 6 (ref 5–15)
BUN: 68 mg/dL — ABNORMAL HIGH (ref 6–20)
CO2: 24 mmol/L (ref 22–32)
Calcium: 7.1 mg/dL — ABNORMAL LOW (ref 8.9–10.3)
Chloride: 107 mmol/L (ref 98–111)
Creatinine, Ser: 3.84 mg/dL — ABNORMAL HIGH (ref 0.61–1.24)
GFR calc Af Amer: 20 mL/min — ABNORMAL LOW (ref >60)
GFR calc non Af Amer: 17 mL/min — ABNORMAL LOW (ref >60)
Glucose, Bld: 130 mg/dL — ABNORMAL HIGH (ref 70–99)
Potassium: 3.2 mmol/L — ABNORMAL LOW (ref 3.5–5.1)
Sodium: 137 mmol/L (ref 135–145)
Total Bilirubin: 2 mg/dL — ABNORMAL HIGH (ref 0.3–1.2)
Total Protein: 5.5 g/dL — ABNORMAL LOW (ref 6.5–8.1)

## 2018-08-08 LAB — CBC
HCT: 32.2 % — ABNORMAL LOW (ref 39.0–52.0)
HCT: 33.1 % — ABNORMAL LOW (ref 39.0–52.0)
Hemoglobin: 9.8 g/dL — ABNORMAL LOW (ref 13.0–17.0)
Hemoglobin: 10.8 g/dL — ABNORMAL LOW (ref 13.0–17.0)
MCH: 28.6 pg (ref 26.0–34.0)
MCH: 31.1 pg (ref 26.0–34.0)
MCHC: 30.4 g/dL (ref 30.0–36.0)
MCHC: 32.6 g/dL (ref 30.0–36.0)
MCV: 93.9 fL (ref 80.0–100.0)
MCV: 95.4 fL (ref 80.0–100.0)
Platelets: 169 10*3/uL (ref 150–400)
Platelets: 190 10*3/uL (ref 150–400)
RBC: 3.43 MIL/uL — ABNORMAL LOW (ref 4.22–5.81)
RBC: 3.47 MIL/uL — ABNORMAL LOW (ref 4.22–5.81)
RDW: 18.6 % — ABNORMAL HIGH (ref 11.5–15.5)
RDW: 19.5 % — ABNORMAL HIGH (ref 11.5–15.5)
WBC: 11.2 10*3/uL — ABNORMAL HIGH (ref 4.0–10.5)
WBC: 11.1 10*3/uL — ABNORMAL HIGH (ref 4.0–10.5)
nRBC: 5.4 % — ABNORMAL HIGH (ref 0.0–0.2)
nRBC: 3.1 % — ABNORMAL HIGH (ref 0.0–0.2)

## 2018-08-08 LAB — RENAL FUNCTION PANEL
Albumin: 1.9 g/dL — ABNORMAL LOW (ref 3.5–5.0)
Albumin: 2.2 g/dL — ABNORMAL LOW (ref 3.5–5.0)
Anion gap: 10 (ref 5–15)
Anion gap: 13 (ref 5–15)
BUN: 65 mg/dL — ABNORMAL HIGH (ref 6–20)
BUN: 74 mg/dL — ABNORMAL HIGH (ref 6–20)
CO2: 19 mmol/L — ABNORMAL LOW (ref 22–32)
CO2: 20 mmol/L — ABNORMAL LOW (ref 22–32)
Calcium: 6.8 mg/dL — ABNORMAL LOW (ref 8.9–10.3)
Calcium: 8.2 mg/dL — ABNORMAL LOW (ref 8.9–10.3)
Chloride: 110 mmol/L (ref 98–111)
Chloride: 102 mmol/L (ref 98–111)
Creatinine, Ser: 3.58 mg/dL — ABNORMAL HIGH (ref 0.61–1.24)
Creatinine, Ser: 4.1 mg/dL — ABNORMAL HIGH (ref 0.61–1.24)
GFR calc Af Amer: 21 mL/min — ABNORMAL LOW (ref >60)
GFR calc Af Amer: 18 mL/min — ABNORMAL LOW (ref >60)
GFR calc non Af Amer: 18 mL/min — ABNORMAL LOW (ref >60)
GFR calc non Af Amer: 16 mL/min — ABNORMAL LOW (ref >60)
Glucose, Bld: 126 mg/dL — ABNORMAL HIGH (ref 70–99)
Glucose, Bld: 162 mg/dL — ABNORMAL HIGH (ref 70–99)
Phosphorus: 2.4 mg/dL — ABNORMAL LOW (ref 2.5–4.6)
Phosphorus: 4.8 mg/dL — ABNORMAL HIGH (ref 2.5–4.6)
Potassium: 3.1 mmol/L — ABNORMAL LOW (ref 3.5–5.1)
Potassium: 4.2 mmol/L (ref 3.5–5.1)
Sodium: 139 mmol/L (ref 135–145)
Sodium: 135 mmol/L (ref 135–145)

## 2018-08-08 LAB — APTT: aPTT: 40 seconds — ABNORMAL HIGH (ref 24–36)

## 2018-08-08 LAB — BLOOD GAS, ARTERIAL
Acid-base deficit: 4.9 mmol/L — ABNORMAL HIGH (ref 0.0–2.0)
Bicarbonate: 20.2 mmol/L (ref 20.0–28.0)
Drawn by: 295031
FIO2: 50
MECHVT: 510 mL
O2 Saturation: 89.4 %
PEEP: 10 cmH2O
Patient temperature: 98.6
RATE: 35 resp/min
pCO2 arterial: 39.9 mmHg (ref 32.0–48.0)
pH, Arterial: 7.325 — ABNORMAL LOW (ref 7.350–7.450)
pO2, Arterial: 62.7 mmHg — ABNORMAL LOW (ref 83.0–108.0)

## 2018-08-08 LAB — MAGNESIUM: Magnesium: 2.6 mg/dL — ABNORMAL HIGH (ref 1.7–2.4)

## 2018-08-08 LAB — LACTATE DEHYDROGENASE: LDH: 320 U/L — ABNORMAL HIGH (ref 98–192)

## 2018-08-08 LAB — LACTIC ACID, PLASMA
Lactic Acid, Venous: 1 mmol/L (ref 0.5–1.9)
Lactic Acid, Venous: 1 mmol/L (ref 0.5–1.9)

## 2018-08-08 LAB — POCT ACTIVATED CLOTTING TIME
Activated Clotting Time: 197 seconds
Activated Clotting Time: 224 seconds
Activated Clotting Time: 230 seconds

## 2018-08-08 MED ORDER — PRISMASOL BGK 4/2.5 32-4-2.5 MEQ/L IV SOLN
INTRAVENOUS | Status: DC
  Administered 2018-08-08 – 2018-08-15 (×47): via INTRAVENOUS_CENTRAL
  Filled 2018-08-08 (×71): qty 5000

## 2018-08-08 MED ORDER — HEPARIN (PORCINE) 2000 UNITS/L FOR CRRT: INTRAVENOUS_CENTRAL | Status: DC | PRN

## 2018-08-08 MED ORDER — PRISMASOL BGK 4/2.5 32-4-2.5 MEQ/L REPLACEMENT SOLN
Status: DC
  Administered 2018-08-08: 13:00:00 via INTRAVENOUS_CENTRAL
  Filled 2018-08-08: qty 5000

## 2018-08-08 MED ORDER — POTASSIUM PHOSPHATES 15 MMOLE/5ML IV SOLN
30.0000 mmol | Freq: Once | INTRAVENOUS | Status: AC
  Administered 2018-08-08: 30 mmol via INTRAVENOUS
  Filled 2018-08-08: qty 10

## 2018-08-08 MED ORDER — HEPARIN SODIUM (PORCINE) 1000 UNIT/ML DIALYSIS
1000.0000 [IU] | INTRAMUSCULAR | Status: DC | PRN
  Administered 2018-08-08: 21:00:00 6000 [IU] via INTRAVENOUS_CENTRAL
  Filled 2018-08-08: qty 6
  Filled 2018-08-08: qty 4
  Filled 2018-08-08: qty 1
  Filled 2018-08-08 (×2): qty 6
  Filled 2018-08-08: qty 1

## 2018-08-08 MED ORDER — HEPARIN (PORCINE) 25000 UT/250ML-% IV SOLN
1850.0000 [IU]/h | INTRAVENOUS | Status: DC
  Administered 2018-08-08 (×2): 1850 [IU]/h via INTRAVENOUS
  Filled 2018-08-08 (×2): qty 250

## 2018-08-08 MED ORDER — PRISMASOL BGK 4/2.5 32-4-2.5 MEQ/L IV SOLN
INTRAVENOUS | Status: DC
  Filled 2018-08-08 (×5): qty 5000

## 2018-08-08 MED ORDER — POTASSIUM CHLORIDE 10 MEQ/50ML IV SOLN
10.0000 meq | INTRAVENOUS | Status: AC
  Administered 2018-08-08 (×6): 10 meq via INTRAVENOUS
  Filled 2018-08-08 (×2): qty 50

## 2018-08-08 MED ORDER — HEPARIN BOLUS VIA INFUSION
3000.0000 [IU] | Freq: Once | INTRAVENOUS | Status: AC
  Administered 2018-08-08: 3000 [IU] via INTRAVENOUS
  Filled 2018-08-08: qty 3000

## 2018-08-08 MED ORDER — POTASSIUM CHLORIDE 10 MEQ/100ML IV SOLN: 10.0000 meq | INTRAVENOUS | Status: DC

## 2018-08-08 MED ORDER — PRISMASOL BGK 4/2.5 32-4-2.5 MEQ/L REPLACEMENT SOLN
Status: DC
  Administered 2018-08-08 – 2018-08-21 (×19): via INTRAVENOUS_CENTRAL
  Filled 2018-08-08 (×36): qty 5000

## 2018-08-08 MED ORDER — ALTEPLASE 2 MG IJ SOLR
2.0000 mg | Freq: Once | INTRAMUSCULAR | Status: AC | PRN
  Administered 2018-08-08: 06:00:00 1.4 mg
  Filled 2018-08-08: qty 2

## 2018-08-08 MED ORDER — DEXTROSE 5 % IV SOLN
20.0000 g | INTRAVENOUS | Status: DC
  Administered 2018-08-08 – 2018-08-16 (×9): 20 g via INTRAVENOUS_CENTRAL
  Filled 2018-08-08 (×19): qty 200

## 2018-08-08 MED ORDER — DEXTROSE 5 % IV SOLN
Status: DC
  Administered 2018-08-08 – 2018-08-21 (×24): via INTRAVENOUS_CENTRAL
  Filled 2018-08-08 (×54): qty 1500

## 2018-08-08 MED ORDER — HEPARIN SODIUM (PORCINE) 1000 UNIT/ML DIALYSIS
1000.0000 [IU] | INTRAMUSCULAR | Status: DC | PRN
  Filled 2018-08-08: qty 6

## 2018-08-08 NOTE — Progress Notes (Signed)
CRRT issue with  alarm;  return pressure being high/ too positive, continuously stopping CRRT from running. Multiple attempts made to trouble shot, such as slowing  blood flow rate and flushing  catheter without resolving issue. HD catheter with heparin dwell, and CRRT on hold at present time per Dr. Jimmy Footman,  Will continue to monitor patient.

## 2018-08-08 NOTE — Progress Notes (Signed)
Patient noted to have dark red blood from rectum. MD notified and heparin drip discontinued. Heparin still being utilized within the CRRT filter, ACT at current is 203.

## 2018-08-08 NOTE — Progress Notes (Signed)
Noted CRRT machine  Malfunctioned  during filter change, unable troubleshoot with assistance of 800 number customer support,  CRRT stopped, HD catheter with Heparin dwell per order, notified Dr. Jimmy Footman, awaiting different machine.

## 2018-08-08 NOTE — Progress Notes (Signed)
PCCM   Evaluated HD catheter in Right IJ. Purple port - receiving amiodarone infusion Blue port is the only one with no withdrawal but fluids advance easily Pt not on systemic heparin but CRRT system heparinized. E link started a heparin dwell.    Plan: Try cathflo through the port with no return And restart CRRT with heparin gtt  Discussed with RN and Elink  Signed Dr Seward Carol Pulmonary Critical Care Locums

## 2018-08-08 NOTE — Progress Notes (Signed)
NAME:  [name] Long, MRN:  [id number], DOB:  1965/12/11, LOS: 87 ADMISSION DATE:  08/04/2018, CONSULTATION DATE:  07/30/2018 REFERRING MD:  07/30/2018, CHIEF COMPLAINT:   Brief History   53 y/o male admitted on 4/1 with abdominal pain which had been persistent for several days.  COVID POSITIVE.  PCCM consulted 4/2 for worsening hypoxemia.   He was noted to be COVID positive by a PCP check earlier in the week.  He went to [facility] with abdominal pain, noted to have diverticulitis, was trasnferred to [facility] for further evaluation.    Past Medical History  DM2, HTN, Former smoker, OSA  [facility] Events   4/01 admission for diverticulitis 4/02 PCCM consulted for worsening dyspnea, hypoxemia 4/02 Proned  4/03 Proned  4/05 Improved oxygenation however ABG with respiratory and metabolic acidosis. Poor UOP 4/06 Supine 4/07 CVVHD, difficulty with filters clotting  4/08 CVVHD ongoing, AFwRVR afternoon of 4/8, started on amio gtt    Consults:  4/2 PCCM  Procedures:  ETT 4/2 >>  L IJ HD 4/6 >> R Rad Aline 4/6 >>   Significant Diagnostic Tests:    Micro Data:  3/30 COVID 19 test positive (outside PCP)  Antimicrobials:  Zosyn 4/1 >> 4/7  Interim history/subjective:  Afebrile.  Keeping even on CVVHD.  RN reports lightening sedation.  Remaons on versed at 4, fentanyl 325, amiodarone 30mg , neo at 100.   Objective   Blood pressure 109/62, pulse 90, temperature (!) 100.5 F (38.1 C), temperature source Axillary, resp. rate 18, height 5\' 10"  (1.778 m), weight 130.3 kg, SpO2 92 %. CVP:  [5 mmHg-85 mmHg] 12 mmHg  Vent Mode: PRVC FiO2 (%):  [50 %] 50 % Set Rate:  [35 bmp] 35 bmp Vt Set:  [510 mL] 510 mL PEEP:  [10 cmH20] 10 cmH20 Plateau Pressure:  [22 cmH20-45 cmH20] 45 cmH20   Intake/Output Summary (Last 24 hours) at 08/08/2018 1139 Last data filed at 08/08/2018 0800 Gross per 24 hour  Intake 3112.86 ml  Output 1906 ml  Net 1206.86 ml   Filed Weights   08/06/18 0423  08/07/18 0500 08/08/18 0435  Weight: 133.8 kg 131 kg 130.3 kg   Physical Exam: General: adult male lying in bed on vent, critically ill appearing  HEENT: MM pink/moist, ETT, abrasions to face Neuro: sedate CV: s1s2 rrr, no m/r/g PULM: even/non-labored, lungs bilaterally clear anterior  SV:XBLT, non-tender, bsx4 active  Extremities: warm/dry, 1+ generalized edema  Skin: no rashes or lesions  Resolved Hospital Problem list   Mixed Acidosis   Assessment & Plan:   Acute Respiratory Failure with Hypoxemia secondary COVID-19 -paralytics + prone 4/2-4/5 -s/p plaquenil and antibiotics for CAP -not a candidate for tocilizumab due to diverticulitis  P: Full vent support via ARDSnet (TV 7cc/kg) ABG reviewed. Wean PEEP / FiO2 for SpO2 goal 88-95% or pO2 55-80 Follow CXR intermittently  Continue sedation for RASS goal -3 Oxycodone 5mg  Q4  Continue klonopin   Hemotochezia P: Discontinue heparin gtt CBC and LA STAT  Hypotension  -Suspect related to volume removal with HD and  -Consider septic shock however WBC and fever curve stable. CXR 4/10 personally reviewed by me shows interval development of right lower lobe opacity. P: If febrile, low threshold to re-culture and start broad spectrum antibiotics. KUB for abdominal distension. Patient with recent diverticulitis Pressor support to maintain MAPs >65  Diverticulitis S/p Zosyn x 8 days P: Monitor off abx  Follow fever curve / WBC trend  AF with RVR  P:  Continue amiodarone gtt Telemetry  AKI with Metabolic Acidosis  P: Maintaining net even with CRRT in setting of hypotension Trend BMP / urinary output Replace electrolytes as indicated Avoid nephrotoxic agents, ensure adequate renal perfusion  Hypokalemia Hypomagnesemia P: Replete as needed  DM2 P: SSI  OSA P: Monitor / intubated   Best practice:  Diet:  tube feeding Pain/Anxiety/Delirium protocol (if indicated): yes, RASS -2 to -3 VAP protocol (if  indicated): yes DVT prophylaxis: sub q heparin GI prophylaxis: pantoprazole Glucose control: SSI Mobility: bed rest Code Status: Limited code blue established / no CPR.  Family Communication: Wife updated via phone 4/10.  He may ultimately need trach (have spoke to Dr. Nelda Marseille, possible next week) if he is making progress.  May run into difficulty with disposition if he needs trach / HD for recovery.  Disposition: ICU  Labs   CBC: Recent Labs  Lab 08/05/18 0500 08/06/18 0437 08/07/18 0346 08/08/18 0420 08/08/18 1103  WBC 8.3 8.8 10.9* 11.2* PENDING  HGB 11.9* 11.5* 12.3* 9.8* 10.8*  HCT 37.7* 37.5* 38.9* 32.2* 33.1*  MCV 92.0 93.1 92.8 93.9 95.4  PLT 174 191 212 169 732    Basic Metabolic Panel: Recent Labs  Lab 08/04/18 0431  08/05/18 0500  08/06/18 0437 08/06/18 1541 08/07/18 0346 08/07/18 1605 08/08/18 0420 08/08/18 0421  NA 137   < > 137   < > 133* 136 136 137 137 139  K 6.2*   < > 3.9   < > 3.7 3.7 4.1 3.9 3.2* 3.1*  CL 101   < > 100   < > 99 103 101 104 107 110  CO2 21*   < > 24   < > 22 22 20* 21* 24 19*  GLUCOSE 220*   < > 133*   < > 110* 146* 151* 153* 130* 126*  BUN 64*   < > 55*   < > 57* 60* 62* 65* 68* 65*  CREATININE 5.51*   < > 3.59*   < > 3.38* 3.26* 3.48* 3.54* 3.84* 3.58*  CALCIUM 7.7*   < > 8.0*   < > 7.8* 7.8* 8.4* 8.0* 7.1* 6.8*  MG 2.9*  --  3.0*  --  3.2*  --  3.0*  --  2.6*  --   PHOS 8.6*   < > 5.7*   < > 4.2 3.6 3.9 3.1  --  2.4*   < > = values in this interval not displayed.   GFR: Estimated Creatinine Clearance: 32.7 mL/min (A) (by C-G formula based on SCr of 3.58 mg/dL (H)). Recent Labs  Lab 08/06/18 0437 08/07/18 0346 08/08/18 0420 08/08/18 1103  WBC 8.8 10.9* 11.2* PENDING    Liver Function Tests: Recent Labs  Lab 08/05/18 0500  08/06/18 1541 08/07/18 0346 08/07/18 1605 08/08/18 0420 08/08/18 0421  AST 114*  --   --   --   --  61*  --   ALT 53*  --   --   --   --  34  --   ALKPHOS 66  --   --   --   --  82  --    BILITOT 5.4*  --   --   --   --  2.0*  --   PROT 6.3*  --   --   --   --  5.5*  --   ALBUMIN 2.4*   < > 2.2* 2.7* 2.5* 2.0* 1.9*   < > = values in this  interval not displayed.   No results for input(s): LIPASE, AMYLASE in the last 168 hours. No results for input(s): AMMONIA in the last 168 hours.  ABG    Component Value Date/Time   PHART 7.325 (L) 08/08/2018 0852   PCO2ART 39.9 08/08/2018 0852   PO2ART 62.7 (L) 08/08/2018 0852   HCO3 20.2 08/08/2018 0852   ACIDBASEDEF 4.9 (H) 08/08/2018 0852   O2SAT 89.4 08/08/2018 0852     Coagulation Profile: No results for input(s): INR, PROTIME in the last 168 hours.  Cardiac Enzymes: No results for input(s): CKTOTAL, CKMB, CKMBINDEX, TROPONINI in the last 168 hours.  HbA1C: No results found for: HGBA1C  CBG: Recent Labs  Lab 08/07/18 0404 08/07/18 0807 08/07/18 1205 08/07/18 1557 08/07/18 1948  GLUCAP 155* 123* 108* 125* 133*    Critical care time: 40 minutes    Rodman Pickle, M.D. [facility] Pulmonary/Critical Care Medicine Pager: [phone] After hours pager: [phone]

## 2018-08-08 NOTE — Progress Notes (Signed)
eLink Physician-Brief Progress Note Patient Name: [name] Long DOB: May 26, 1965 MRN: [id number]   Date of Service  08/08/2018  HPI/Events of Note  Hypokalemia and hypophos  eICU Interventions  Potassium and phos replaced     Intervention Category Intermediate Interventions: Electrolyte abnormality - evaluation and management  DETERDING,ELIZABETH 08/08/2018, 6:41 AM

## 2018-08-08 NOTE — Progress Notes (Signed)
ANTICOAGULATION CONSULT NOTE - Follow Up Consult  Pharmacy Consult for heparin Indication: positive COVID patient on CRRT needs systemic anticoagulation  No Known Allergies  Patient Measurements: Height: 5\' 10"  (177.8 cm) Weight: 288 lb 12.8 oz (131 kg) IBW/kg (Calculated) : 73 Heparin Dosing Weight: 103.2 kg  Vital Signs: Temp: 100.5 F (38.1 C) (04/10 2000) Temp Source: Axillary (04/10 2000) BP: 109/62 (04/11 0015) Pulse Rate: 75 (04/11 0351)  Labs: Recent Labs    08/05/18 0500  08/06/18 0437 08/06/18 1541 08/07/18 0346 08/07/18 1605  HGB 11.9*  --  11.5*  --  12.3*  --   HCT 37.7*  --  37.5*  --  38.9*  --   PLT 174  --  191  --  212  --   APTT 49*  --  33  --  37*  --   CREATININE 3.59*   < > 3.38* 3.26* 3.48* 3.54*   < > = values in this interval not displayed.    Estimated Creatinine Clearance: 33.2 mL/min (A) (by C-G formula based on SCr of 3.54 mg/dL (H)).   Medications:  Medications Prior to Admission  Medication Sig Dispense Refill Last Dose  . aspirin 81 MG tablet Take 81 mg by mouth daily.     Past Week at Unknown time  . atorvastatin (LIPITOR) 40 MG tablet Take 40 mg by mouth daily.   Past Week at Unknown time  . buPROPion (WELLBUTRIN SR) 200 MG 12 hr tablet Take 200 mg by mouth 2 (two) times daily.   Past Week at Unknown time  . co-enzyme Q-10 30 MG capsule Take 30 mg by mouth daily.   Past Week at Unknown time  . empagliflozin (JARDIANCE) 25 MG TABS tablet Take 25 mg by mouth daily.   Past Week at Unknown time  . Glucos-Chond-Hyal Ac-Ca Fructo (MOVE FREE JOINT HEALTH ADVANCE PO) Take 1 tablet by mouth daily.   Past Week at Unknown time  . metFORMIN (GLUCOPHAGE-XR) 500 MG 24 hr tablet Take 1,000 mg by mouth daily.   Past Week at Unknown time  . metoprolol tartrate (LOPRESSOR) 25 MG tablet Take 25 mg by mouth 2 (two) times daily.   Past Week at Unknown time  . spironolactone (ALDACTONE) 25 MG tablet Take 25 mg by mouth daily.   Past Week at Unknown time     Assessment: 53 yo man to start heparin per pharmacy.  positive COVID patient on CRRT needs systemic anticoagulation per CCM request.  Hg 12.3, pltc 211. ARF on CRRT.  TBW 131, IBW 73 kg, HDW 103.2 kg.   Goal of Therapy:  Heparin level 0.3-0.7 units/ml Monitor platelets by anticoagulation protocol: Yes   Plan:  Give 3000 units bolus x 1 Start heparin infusion at 1850 units/hr Check anti-Xa level in 8 hours and daily while on heparin Continue to monitor H&H and platelets  Eudelia Bunch, Pharm.D [phone] 08/08/2018 4:13 AM

## 2018-08-08 NOTE — Progress Notes (Signed)
[facility] ROUNDING NOTE   Subjective:   This is a 53 year old gentleman diabetes hypertension obstructive sleep apnea on CPAP.  Admitted with  COVID-19.  He has been treated by CCM with Plaquenil azithromycin.  CRRT was initiated 08/02/2018.  Has had some clotting of the filter and required initiation of heparin with dialysis 08/03/2018.  He has been kept even.  Has been placed back on pressors.  Still not making great progress.  Filter clotting with heparin  Being kept even on CRRT no much change.  Have attempted to pull 100 to 150 cc an hour with little success.   Blood pressure 88/41 pulse 81 O2 sats 90% FiO2 50  Chest x-ray shows worsening patchy opacity in right lung base 08/07/2018   CT abd from [facility] on 4/1 - R kidney visualized, no hydro noted, probably there is dye in the renal pelvis  Sodium 139 potassium 3.1 chloride 110 CO2 19 BUN 65 creatinine 3.58 glucose 126 phosphorus 2.4 albumin 1.9 WBC 11.1 hemoglobin 10.8 platelets 190    Protonix 40 mg daily, phenylephrine IV, amiodarone IV  Objective:  Vital signs in last 24 hours:  Temp:  [99.5 F (37.5 C)-100.5 F (38.1 C)] 100.5 F (38.1 C) (04/10 2000) Pulse Rate:  [61-92] 90 (04/11 1000) Resp:  [10-42] 18 (04/11 1000) BP: (106-109)/(32-62) 109/62 (04/11 0015) SpO2:  [88 %-97 %] 88 % (04/11 1155) Arterial Line BP: (90-123)/(32-56) 114/47 (04/11 1000) FiO2 (%):  [50 %] 50 % (04/11 1155) Weight:  [130.3 kg] 130.3 kg (04/11 0435)  Weight change: -0.7 kg Filed Weights   08/06/18 0423 08/07/18 0500 08/08/18 0435  Weight: 133.8 kg 131 kg 130.3 kg    Intake/Output: I/O last 3 completed shifts: In: 5453.3 [I.V.:3760.6; NG/GT:1142.7; IV Piggyback:550] Out: 4508 [OZYYQ:8250]   Intake/Output this shift:  Total I/O In: 50 [NG/GT:50] Out: -   Patient not physically examined in room being positive for covid 19   Basic Metabolic Panel: Recent Labs  Lab 08/04/18 0431  08/05/18 0500  08/06/18 0437  08/06/18 1541 08/07/18 0346 08/07/18 1605 08/08/18 0420 08/08/18 0421  NA 137   < > 137   < > 133* 136 136 137 137 139  K 6.2*   < > 3.9   < > 3.7 3.7 4.1 3.9 3.2* 3.1*  CL 101   < > 100   < > 99 103 101 104 107 110  CO2 21*   < > 24   < > 22 22 20* 21* 24 19*  GLUCOSE 220*   < > 133*   < > 110* 146* 151* 153* 130* 126*  BUN 64*   < > 55*   < > 57* 60* 62* 65* 68* 65*  CREATININE 5.51*   < > 3.59*   < > 3.38* 3.26* 3.48* 3.54* 3.84* 3.58*  CALCIUM 7.7*   < > 8.0*   < > 7.8* 7.8* 8.4* 8.0* 7.1* 6.8*  MG 2.9*  --  3.0*  --  3.2*  --  3.0*  --  2.6*  --   PHOS 8.6*   < > 5.7*   < > 4.2 3.6 3.9 3.1  --  2.4*   < > = values in this interval not displayed.    Liver Function Tests: Recent Labs  Lab 08/05/18 0500  08/06/18 1541 08/07/18 0346 08/07/18 1605 08/08/18 0420 08/08/18 0421  AST 114*  --   --   --   --  61*  --   ALT  53*  --   --   --   --  34  --   ALKPHOS 66  --   --   --   --  82  --   BILITOT 5.4*  --   --   --   --  2.0*  --   PROT 6.3*  --   --   --   --  5.5*  --   ALBUMIN 2.4*   < > 2.2* 2.7* 2.5* 2.0* 1.9*   < > = values in this interval not displayed.   No results for input(s): LIPASE, AMYLASE in the last 168 hours. No results for input(s): AMMONIA in the last 168 hours.  CBC: Recent Labs  Lab 08/05/18 0500 08/06/18 0437 08/07/18 0346 08/08/18 0420 08/08/18 1103  WBC 8.3 8.8 10.9* 11.2* 11.1*  HGB 11.9* 11.5* 12.3* 9.8* 10.8*  HCT 37.7* 37.5* 38.9* 32.2* 33.1*  MCV 92.0 93.1 92.8 93.9 95.4  PLT 174 191 212 169 190    Cardiac Enzymes: No results for input(s): CKTOTAL, CKMB, CKMBINDEX, TROPONINI in the last 168 hours.  BNP: Invalid input(s): POCBNP  CBG: Recent Labs  Lab 08/07/18 1948 08/08/18 0023 08/08/18 0419 08/08/18 0829 08/08/18 1201  GLUCAP 133* 107* 131* 149* 133*    Microbiology: Results for orders placed or performed during the hospital encounter of 08/07/2018  MRSA PCR Screening     Status: None   Collection Time: 07/30/18   2:30 AM  Result Value Ref Range Status   MRSA by PCR NEGATIVE NEGATIVE Final    Comment:        The GeneXpert MRSA Assay (FDA approved for NASAL specimens only), is one component of a comprehensive MRSA colonization surveillance program. It is not intended to diagnose MRSA infection nor to guide or monitor treatment for MRSA infections. Performed at [facility], [place] [address], [place], [place] [address]     Coagulation Studies: No results for input(s): LABPROT, INR in the last 72 hours.  Urinalysis: No results for input(s): COLORURINE, LABSPEC, PHURINE, GLUCOSEU, HGBUR, BILIRUBINUR, KETONESUR, PROTEINUR, UROBILINOGEN, NITRITE, LEUKOCYTESUR in the last 72 hours.  Invalid input(s): APPERANCEUR    Imaging: Dg Chest Port 1 View  Result Date: 08/07/2018 CLINICAL DATA:  Respiratory distress. EXAM: PORTABLE CHEST 1 VIEW COMPARISON:  Radiograph 08/02/2018, CT 08/06/2018 FINDINGS: Endotracheal tube is not well visualized, tip tentatively identified above the thoracic inlet. Left internal jugular central line tip projects over the distal brachiocephalic vein. Left upper extremity PICC tip projects over the brachiocephalic/SVC confluence. Cardiomegaly is unchanged. Worsening patchy opacity at the right lung base. Unchanged retrocardiac opacity with improved aeration of the left mid lung. No pneumothorax. IMPRESSION: 1. Worsening patchy opacity at the right lung base. 2. Unchanged retrocardiac opacity and cardiomegaly. 3. Endotracheal tube not well seen, tip possibly above the thoracic inlet. Electronically Signed   By: Keith Rake M.D.   On: 08/07/2018 03:30     Medications:   .  prismasol BGK 4/2.5 500 mL/hr at 08/08/18 1105  .  prismasol BGK 4/2.5 500 mL/hr at 08/08/18 1105  . sodium chloride    . amiodarone 30 mg/hr (08/08/18 0820)  . dexmedetomidine (PRECEDEX) IV infusion 0.6 mcg/kg/hr (08/08/18 0744)  . feeding supplement (VITAL HIGH PROTEIN) 35 mL/hr at  08/08/18 0400  . fentaNYL infusion INTRAVENOUS 300 mcg/hr (08/08/18 1203)  . heparin 10,000 units/ 20 mL infusion syringe 1,850 Units/hr (08/08/18 1200)  . midazolam Stopped (08/07/18 1809)  . phenylephrine (NEO-SYNEPHRINE) Adult infusion 140  mcg/min (08/08/18 1234)  . potassium chloride Stopped (08/08/18 1302)  . potassium PHOSPHATE IVPB (in mmol) 30 mmol (08/08/18 0800)  . prismasol BGK 4/2.5 2,000 mL/hr at 08/08/18 1105  . prismasol BGK 4/2.5     . chlorhexidine gluconate (MEDLINE KIT)  15 mL Mouth Rinse BID  . Chlorhexidine Gluconate Cloth  6 each Topical Daily  . Chlorhexidine Gluconate Cloth  6 each Topical Daily  . clonazePAM  1 mg Oral BID  . feeding supplement (PRO-STAT SUGAR FREE 64)  60 mL Per Tube BID  . insulin aspart  0-9 Units Subcutaneous Q4H  . mouth rinse  15 mL Mouth Rinse 10 times per day  . oxyCODONE  5 mg Per Tube Q4H  . pantoprazole (PROTONIX) IV  40 mg Intravenous Q24H  . sodium chloride flush  10-40 mL Intracatheter Q12H   Place/Maintain arterial line **AND** sodium chloride, acetaminophen (TYLENOL) oral liquid 160 mg/5 mL, albuterol, docusate, fentaNYL, heparin, heparin, hydrALAZINE, lip balm, midazolam, ondansetron **OR** ondansetron (ZOFRAN) IV, sodium chloride, sodium chloride flush  Assessment/ Plan:   Acute respiratory failure.  Suspect ATN possibly contrast related.  Worsening respiratory acidosis and metabolic acidosis CRRT initiated.  Metabolic and acid-base parameters appear to be corrected.    Hypokalemia will replete  Clotting of dialysis filter we will try regional anticoagulation with citrate.  Pneumonia secondary to COVID-19 on ventilator with high support  Diabetes mellitus per primary team  Obesity and obstructive sleep apnea  History of atrial fibrillation patient now back on IV amiodarone  Electrolytes appear to be stable  Acid-base.  Combined metabolic and respiratory acidosis.  Somewhat improved  Volume being kept even at this  present time.  We have been attempting to pull fluid but having little success.  Requiring increased pressor support  Disposition appears a little progress is being made at this point continues to be oligo anuric.  Suggest withdrawal of care if no improvement seen within the next 24 to 48 hours.     LOS: [place] '@TODAY' '@12' :38 PM

## 2018-08-09 LAB — POCT I-STAT EG7
Acid-base deficit: 4 mmol/L — ABNORMAL HIGH (ref 0.0–2.0)
Acid-base deficit: 1 mmol/L (ref 0.0–2.0)
Acid-base deficit: 1 mmol/L (ref 0.0–2.0)
Acid-base deficit: 1 mmol/L (ref 0.0–2.0)
Acid-base deficit: 2 mmol/L (ref 0.0–2.0)
Bicarbonate: 23.4 mmol/L (ref 20.0–28.0)
Bicarbonate: 27.5 mmol/L (ref 20.0–28.0)
Bicarbonate: 27 mmol/L (ref 20.0–28.0)
Bicarbonate: 26.9 mmol/L (ref 20.0–28.0)
Bicarbonate: 23.3 mmol/L (ref 20.0–28.0)
Bicarbonate: 27.7 mmol/L (ref 20.0–28.0)
Calcium, Ion: 0.67 mmol/L — CL (ref 1.15–1.40)
Calcium, Ion: 0.32 mmol/L — CL (ref 1.15–1.40)
Calcium, Ion: 0.42 mmol/L — CL (ref 1.15–1.40)
Calcium, Ion: 0.56 mmol/L — CL (ref 1.15–1.40)
Calcium, Ion: 0.47 mmol/L — CL (ref 1.15–1.40)
Calcium, Ion: 1.15 mmol/L (ref 1.15–1.40)
HCT: 33 % — ABNORMAL LOW (ref 39.0–52.0)
HCT: 34 % — ABNORMAL LOW (ref 39.0–52.0)
HCT: 37 % — ABNORMAL LOW (ref 39.0–52.0)
HCT: 35 % — ABNORMAL LOW (ref 39.0–52.0)
HCT: 30 % — ABNORMAL LOW (ref 39.0–52.0)
HCT: 37 % — ABNORMAL LOW (ref 39.0–52.0)
Hemoglobin: 11.2 g/dL — ABNORMAL LOW (ref 13.0–17.0)
Hemoglobin: 11.6 g/dL — ABNORMAL LOW (ref 13.0–17.0)
Hemoglobin: 12.6 g/dL — ABNORMAL LOW (ref 13.0–17.0)
Hemoglobin: 11.9 g/dL — ABNORMAL LOW (ref 13.0–17.0)
Hemoglobin: 10.2 g/dL — ABNORMAL LOW (ref 13.0–17.0)
Hemoglobin: 12.6 g/dL — ABNORMAL LOW (ref 13.0–17.0)
O2 Saturation: 49 %
O2 Saturation: 46 %
O2 Saturation: 38 %
O2 Saturation: 41 %
O2 Saturation: 36 %
O2 Saturation: 92 %
Patient temperature: 97.9
Patient temperature: 96.9
Patient temperature: 97.1
Patient temperature: 97.2
Potassium: 4.1 mmol/L (ref 3.5–5.1)
Potassium: 3.9 mmol/L (ref 3.5–5.1)
Potassium: 3.9 mmol/L (ref 3.5–5.1)
Potassium: 3.9 mmol/L (ref 3.5–5.1)
Potassium: 3.8 mmol/L (ref 3.5–5.1)
Potassium: 3.9 mmol/L (ref 3.5–5.1)
Sodium: 139 mmol/L (ref 135–145)
Sodium: 140 mmol/L (ref 135–145)
Sodium: 139 mmol/L (ref 135–145)
Sodium: 139 mmol/L (ref 135–145)
Sodium: 138 mmol/L (ref 135–145)
Sodium: 138 mmol/L (ref 135–145)
TCO2: 25 mmol/L (ref 22–32)
TCO2: 29 mmol/L (ref 22–32)
TCO2: 29 mmol/L (ref 22–32)
TCO2: 29 mmol/L (ref 22–32)
TCO2: 24 mmol/L (ref 22–32)
TCO2: 29 mmol/L (ref 22–32)
pCO2, Ven: 52.2 mmHg (ref 44.0–60.0)
pCO2, Ven: 59.8 mmHg (ref 44.0–60.0)
pCO2, Ven: 59 mmHg (ref 44.0–60.0)
pCO2, Ven: 56.5 mmHg (ref 44.0–60.0)
pCO2, Ven: 40.5 mmHg — ABNORMAL LOW (ref 44.0–60.0)
pCO2, Ven: 59.1 mmHg (ref 44.0–60.0)
pH, Ven: 7.257 (ref 7.250–7.430)
pH, Ven: 7.266 (ref 7.250–7.430)
pH, Ven: 7.264 (ref 7.250–7.430)
pH, Ven: 7.281 (ref 7.250–7.430)
pH, Ven: 7.279 (ref 7.250–7.430)
pH, Ven: 7.368 (ref 7.250–7.430)
pO2, Ven: 30 mmHg — CL (ref 32.0–45.0)
pO2, Ven: 28 mmHg — CL (ref 32.0–45.0)
pO2, Ven: 25 mmHg — CL (ref 32.0–45.0)
pO2, Ven: 26 mmHg — CL (ref 32.0–45.0)
pO2, Ven: 25 mmHg — CL (ref 32.0–45.0)
pO2, Ven: 67 mmHg — ABNORMAL HIGH (ref 32.0–45.0)

## 2018-08-09 LAB — POCT I-STAT 7, (LYTES, BLD GAS, ICA,H+H)
Acid-base deficit: 5 mmol/L — ABNORMAL HIGH (ref 0.0–2.0)
Acid-base deficit: 5 mmol/L — ABNORMAL HIGH (ref 0.0–2.0)
Acid-base deficit: 2 mmol/L (ref 0.0–2.0)
Acid-base deficit: 2 mmol/L (ref 0.0–2.0)
Bicarbonate: 21 mmol/L (ref 20.0–28.0)
Bicarbonate: 19.9 mmol/L — ABNORMAL LOW (ref 20.0–28.0)
Bicarbonate: 23.2 mmol/L (ref 20.0–28.0)
Bicarbonate: 23 mmol/L (ref 20.0–28.0)
Calcium, Ion: 1.09 mmol/L — ABNORMAL LOW (ref 1.15–1.40)
Calcium, Ion: 1.09 mmol/L — ABNORMAL LOW (ref 1.15–1.40)
Calcium, Ion: 1.17 mmol/L (ref 1.15–1.40)
Calcium, Ion: 1.16 mmol/L (ref 1.15–1.40)
HCT: 28 % — ABNORMAL LOW (ref 39.0–52.0)
HCT: 27 % — ABNORMAL LOW (ref 39.0–52.0)
HCT: 32 % — ABNORMAL LOW (ref 39.0–52.0)
HCT: 29 % — ABNORMAL LOW (ref 39.0–52.0)
Hemoglobin: 9.5 g/dL — ABNORMAL LOW (ref 13.0–17.0)
Hemoglobin: 9.2 g/dL — ABNORMAL LOW (ref 13.0–17.0)
Hemoglobin: 10.9 g/dL — ABNORMAL LOW (ref 13.0–17.0)
Hemoglobin: 9.9 g/dL — ABNORMAL LOW (ref 13.0–17.0)
O2 Saturation: 93 %
O2 Saturation: 94 %
O2 Saturation: 94 %
O2 Saturation: 95 %
Patient temperature: 97.9
Patient temperature: 96.9
Patient temperature: 97.1
Patient temperature: 97.2
Potassium: 4.1 mmol/L (ref 3.5–5.1)
Potassium: 3.8 mmol/L (ref 3.5–5.1)
Potassium: 4.2 mmol/L (ref 3.5–5.1)
Potassium: 4 mmol/L (ref 3.5–5.1)
Sodium: 137 mmol/L (ref 135–145)
Sodium: 140 mmol/L (ref 135–145)
Sodium: 139 mmol/L (ref 135–145)
Sodium: 137 mmol/L (ref 135–145)
TCO2: 22 mmol/L (ref 22–32)
TCO2: 21 mmol/L — ABNORMAL LOW (ref 22–32)
TCO2: 24 mmol/L (ref 22–32)
TCO2: 24 mmol/L (ref 22–32)
pCO2 arterial: 39.4 mmHg (ref 32.0–48.0)
pCO2 arterial: 34 mmHg (ref 32.0–48.0)
pCO2 arterial: 40 mmHg (ref 32.0–48.0)
pCO2 arterial: 39.3 mmHg (ref 32.0–48.0)
pH, Arterial: 7.333 — ABNORMAL LOW (ref 7.350–7.450)
pH, Arterial: 7.372 (ref 7.350–7.450)
pH, Arterial: 7.368 (ref 7.350–7.450)
pH, Arterial: 7.373 (ref 7.350–7.450)
pO2, Arterial: 70 mmHg — ABNORMAL LOW (ref 83.0–108.0)
pO2, Arterial: 70 mmHg — ABNORMAL LOW (ref 83.0–108.0)
pO2, Arterial: 71 mmHg — ABNORMAL LOW (ref 83.0–108.0)
pO2, Arterial: 73 mmHg — ABNORMAL LOW (ref 83.0–108.0)

## 2018-08-09 LAB — GLUCOSE, CAPILLARY
Glucose-Capillary: 139 mg/dL — ABNORMAL HIGH (ref 70–99)
Glucose-Capillary: 141 mg/dL — ABNORMAL HIGH (ref 70–99)
Glucose-Capillary: 145 mg/dL — ABNORMAL HIGH (ref 70–99)
Glucose-Capillary: 152 mg/dL — ABNORMAL HIGH (ref 70–99)
Glucose-Capillary: 155 mg/dL — ABNORMAL HIGH (ref 70–99)
Glucose-Capillary: 158 mg/dL — ABNORMAL HIGH (ref 70–99)

## 2018-08-09 LAB — POCT ACTIVATED CLOTTING TIME: Activated Clotting Time: 131 seconds

## 2018-08-09 LAB — APTT: aPTT: 31 seconds (ref 24–36)

## 2018-08-09 LAB — CBC
HCT: 32.7 % — ABNORMAL LOW (ref 39.0–52.0)
Hemoglobin: 10.4 g/dL — ABNORMAL LOW (ref 13.0–17.0)
MCH: 30.1 pg (ref 26.0–34.0)
MCHC: 31.8 g/dL (ref 30.0–36.0)
MCV: 94.5 fL (ref 80.0–100.0)
Platelets: 197 10*3/uL (ref 150–400)
RBC: 3.46 MIL/uL — ABNORMAL LOW (ref 4.22–5.81)
RDW: 18.9 % — ABNORMAL HIGH (ref 11.5–15.5)
WBC: 7.8 10*3/uL (ref 4.0–10.5)
nRBC: 2.2 % — ABNORMAL HIGH (ref 0.0–0.2)

## 2018-08-09 LAB — RENAL FUNCTION PANEL
Albumin: 2 g/dL — ABNORMAL LOW (ref 3.5–5.0)
Albumin: 2.2 g/dL — ABNORMAL LOW (ref 3.5–5.0)
Anion gap: 15 (ref 5–15)
Anion gap: 14 (ref 5–15)
BUN: 98 mg/dL — ABNORMAL HIGH (ref 6–20)
BUN: 98 mg/dL — ABNORMAL HIGH (ref 6–20)
CO2: 20 mmol/L — ABNORMAL LOW (ref 22–32)
CO2: 21 mmol/L — ABNORMAL LOW (ref 22–32)
Calcium: 8.1 mg/dL — ABNORMAL LOW (ref 8.9–10.3)
Calcium: 8.6 mg/dL — ABNORMAL LOW (ref 8.9–10.3)
Chloride: 103 mmol/L (ref 98–111)
Chloride: 102 mmol/L (ref 98–111)
Creatinine, Ser: 5.17 mg/dL — ABNORMAL HIGH (ref 0.61–1.24)
Creatinine, Ser: 4.95 mg/dL — ABNORMAL HIGH (ref 0.61–1.24)
GFR calc Af Amer: 14 mL/min — ABNORMAL LOW (ref >60)
GFR calc Af Amer: 14 mL/min — ABNORMAL LOW (ref >60)
GFR calc non Af Amer: 12 mL/min — ABNORMAL LOW (ref >60)
GFR calc non Af Amer: 12 mL/min — ABNORMAL LOW (ref >60)
Glucose, Bld: 164 mg/dL — ABNORMAL HIGH (ref 70–99)
Glucose, Bld: 171 mg/dL — ABNORMAL HIGH (ref 70–99)
Phosphorus: 6.2 mg/dL — ABNORMAL HIGH (ref 2.5–4.6)
Phosphorus: 5.4 mg/dL — ABNORMAL HIGH (ref 2.5–4.6)
Potassium: 4.3 mmol/L (ref 3.5–5.1)
Potassium: 4.1 mmol/L (ref 3.5–5.1)
Sodium: 138 mmol/L (ref 135–145)
Sodium: 137 mmol/L (ref 135–145)

## 2018-08-09 LAB — MAGNESIUM: Magnesium: 2.7 mg/dL — ABNORMAL HIGH (ref 1.7–2.4)

## 2018-08-09 MED ORDER — ANTICOAGULANT SODIUM CITRATE 4% (200MG/5ML) IV SOLN
5.0000 mL | Status: DC | PRN
  Administered 2018-08-09: 3.2 mL via INTRAVENOUS
  Filled 2018-08-09: qty 5

## 2018-08-09 MED ORDER — MIDAZOLAM HCL 2 MG/2ML IJ SOLN
1.0000 mg | INTRAMUSCULAR | Status: DC | PRN
  Administered 2018-08-14 – 2018-08-16 (×8): 2 mg via INTRAVENOUS
  Filled 2018-08-09 (×9): qty 2

## 2018-08-09 MED ORDER — PIPERACILLIN-TAZOBACTAM IN DEX 2-0.25 GM/50ML IV SOLN
2.2500 g | Freq: Four times a day (QID) | INTRAVENOUS | Status: DC
  Administered 2018-08-10 – 2018-08-15 (×22): 2.25 g via INTRAVENOUS
  Filled 2018-08-09 (×25): qty 50

## 2018-08-09 MED ORDER — ALTEPLASE 2 MG IJ SOLR
2.0000 mg | Freq: Once | INTRAMUSCULAR | Status: AC | PRN
  Administered 2018-08-09: 01:00:00 2 mg
  Filled 2018-08-09: qty 2

## 2018-08-09 MED ORDER — STERILE WATER FOR INJECTION IJ SOLN
INTRAMUSCULAR | Status: AC
  Filled 2018-08-09: qty 10

## 2018-08-09 MED ORDER — VANCOMYCIN HCL IN DEXTROSE 1-5 GM/200ML-% IV SOLN: 1000.0000 mg | INTRAVENOUS | Status: DC

## 2018-08-09 MED ORDER — VANCOMYCIN HCL 10 G IV SOLR
2000.0000 mg | Freq: Once | INTRAVENOUS | Status: AC
  Administered 2018-08-09: 2000 mg via INTRAVENOUS
  Filled 2018-08-09: qty 2000

## 2018-08-09 MED ORDER — PIPERACILLIN-TAZOBACTAM IN DEX 2-0.25 GM/50ML IV SOLN
2.2500 g | INTRAVENOUS | Status: AC
  Administered 2018-08-09: 2.25 g via INTRAVENOUS
  Filled 2018-08-09: qty 50

## 2018-08-09 NOTE — Progress Notes (Addendum)
CRRT machine ready for use. Notified  Dr. Jimmy Footman and Dr. Hollie Salk  that the HD catheter blue port flushes without blood return. Dr. Hollie Salk with new orders cathflo.    0530: HD catheter declotted per Dr. Hollie Salk orders, however CRRT machine will not load the filter set. Notified Camera operator, [employer], and bio med.

## 2018-08-09 NOTE — Progress Notes (Addendum)
Pharmacy Antibiotic Note  Cade Dashner is a 53 y.o. male admitted on 08/03/2018 with COVID-19 and diverticulitis. Pharmacy has been consulted for piperacillin/tazobactam dosing for  intra-abdominal infection. NKDA.  Patient is currently on CRRT receiving CVVHD since 08/04/18. Pt was previously on antibiotics this admission, last received on 08/04/18.   Today, 08/09/18 -WBC 7.8 -SCr 5.17 -Afebrile  Plan:  Piperacillin/tazobactam 2.25 g IV q6h while on CRRT  Monitor renal function and culture data  Height: 5\' 10"  (177.8 cm) Weight: 289 lb 3.9 oz (131.2 kg) IBW/kg (Calculated) : 73  Temp (24hrs), Avg:98.2 F (36.8 C), Min:97.7 F (36.5 C), Max:99.2 F (37.3 C)  Recent Labs  Lab 08/06/18 0437  08/07/18 0346 08/07/18 1605 08/08/18 0420 08/08/18 0421 08/08/18 1102 08/08/18 1103 08/08/18 1402 08/08/18 1908 08/09/18 0327  WBC 8.8  --  10.9*  --  11.2*  --   --  11.1*  --   --  7.8  CREATININE 3.38*   < > 3.48* 3.54* 3.84* 3.58*  --   --   --  4.10* 5.17*  LATICACIDVEN  --   --   --   --   --   --  1.0  --  1.0  --   --    < > = values in this interval not displayed.    Estimated Creatinine Clearance: 22.8 mL/min (A) (by C-G formula based on SCr of 5.17 mg/dL (H)).    No Known Allergies  Antimicrobials this admission: Piperacillin/tazobactam 4/2 > 4.7 then resumed 4/12 > Azithromycin 4/2 > 4/7 Hydroxychloroquine 4/2 > 4/6  Dose adjustments this admission:  Microbiology results: 3/30 COVID-19: Positive outpatient PTA 4/2 MRSA PCR: Negative   Thank you for allowing pharmacy to be a part of this patient's care.  Lenis Noon, PharmD 08/09/2018 12:50 PM   Addendum: Received pharmacy to dose vancomycin consult this evening.   Will order vancomycin 2000 mg IV loading dose following by 1000 mg IV q24h.  -Monitor renal function daily. Pt currently on CRRT.  -Check vancomycin trough once at steady state. Goal VT 15-20 mcg/mL.  Pharmacy to follow  Lenis Noon,  PharmD 08/09/18 9:42 PM

## 2018-08-09 NOTE — Progress Notes (Addendum)
NAME:  [name] Long, MRN:  [id number], DOB:  Sep 02, 1965, LOS: 21 ADMISSION DATE:  08/10/2018, CONSULTATION DATE:  07/30/2018 REFERRING MD:  07/30/2018, CHIEF COMPLAINT:   Brief History   53 y/o male admitted on 4/1 with abdominal pain which had been persistent for several days.  COVID POSITIVE.  PCCM consulted 4/2 for worsening hypoxemia.   He was noted to be COVID positive by a PCP check earlier in the week.  He went to [facility] with abdominal pain, noted to have diverticulitis, was trasnferred to [facility] for further evaluation.    Past Medical History  DM2, HTN, Former smoker, OSA  [facility]   4/01 admission for diverticulitis 4/02 PCCM consulted for worsening dyspnea, hypoxemia 4/02 Proned  4/03 Proned  4/05 Improved oxygenation however ABG with respiratory and metabolic acidosis. Poor UOP 4/06 Supine 4/07 CVVHD, difficulty with filters clotting  4/08 CVVHD ongoing, AFwRVR afternoon of 4/8, started on amio gtt   08/09/2018 hemodialysis catheter changed to right femoral location  Consults:  4/2 PCCM Renal 4/12 GI  Procedures:  ETT 4/2 >>  L IJ HD 4/6 >> 08/09/2018 R Rad Aline 4/6 >>  08/09/2018 right femoral HD catheter 24 cm>>  Significant Diagnostic Tests:    Micro Data:  3/30 COVID 19 test positive (outside PCP)  Antimicrobials:  Zosyn 4/1 >> 4/7 Zoysn 4/12 per GI request>>  Interim history/subjective:  Abdomen noted to be protuberant.  No further bloody stools.  Remains off heparin drip. GI consulted TF stopped OGT LWIS  Objective   Blood pressure 109/62, pulse 84, temperature 99.2 F (37.3 C), temperature source Oral, resp. rate (!) 35, height 5\' 10"  (1.778 m), weight 131.2 kg, SpO2 91 %. CVP:  [0 mmHg-16 mmHg] 16 mmHg  Vent Mode: PRVC FiO2 (%):  [40 %-50 %] 40 % Set Rate:  [25 bmp-35 bmp] 35 bmp Vt Set:  [510 mL] 510 mL PEEP:  [10 cmH20] 10 cmH20 Plateau Pressure:  [23 cmH20-28 cmH20] 23 cmH20   Intake/Output Summary (Last 24 hours)  at 08/09/2018 1112 Last data filed at 08/09/2018 0900 Gross per 24 hour  Intake 3739.32 ml  Output 2497 ml  Net 1242.32 ml   Filed Weights   08/07/18 0500 08/08/18 0435 08/09/18 0500  Weight: 131 kg 130.3 kg 131.2 kg   Physical Exam: General: Morbidly obese male who is well sedated currently on full mechanical ventilatory support HEENT: No neck, no JVD or lymphadenopathy is appreciated, left IJ hemodialysis catheter noted to be clotted will be removed today  neuro: Currently sedated with multiple agents.  Withdraws to noxious stimuli CV: Sounds are regular irregular PULM: Coarse rhonchi bilaterally.  FiO2 decreased to 40% GI: Firm, protruding, tube feeding stopped. OGT-> LWIS Extremities: warm/dry, 2+ edema  Skin: no rashes or lesions   Resolved Hospital Problem list   Mixed Acidosis   Assessment & Plan:   Acute Respiratory Failure with Hypoxemia secondary COVID-19 -paralytics + prone 4/2-4/5 -s/p plaquenil and antibiotics for CAP -not a candidate for tocilizumab due to diverticulitis  P: ARDS protocol tidal volume 7 cc/kg Wean percentage of oxygen as able Serial chest x-rays Sedation with RA SS goal of -3 Continue oxycodone and Klonopin    Hemotochezia P: Off heparin Lactic acid and CBC unrevealing No further bloody stool Abdominal film is unremarkable 08/08/2010 Abd distension  Hypotension  Continue to monitor  P:  T-max 99.2 Abdominal x-ray reveals no overt ileus Tube feedings been decreased to half during this. Bowel regimen instituted  Diverticulitis S/p Zosyn x 8 days P:  Monitor off antibiotics White count and fever curve are flat  AF with RVR  P:  Currently on amiodarone drip with heart rate of 89 Cardiac monitoring Not on heparin  AKI with Metabolic Acidosis  Lab Results  Component Value Date   CREATININE 5.17 (H) 08/09/2018   CREATININE 4.10 (H) 08/08/2018   CREATININE 3.58 (H) 08/08/2018    P: Currently maintaining even with CRRT   continue to monitor electrolytes and urine output Replace electrolytes as needed Avoid nephrotoxins  Hypokalemia Hypomagnesemia P: Replete as needed  DM2 CBG (last 3)  Recent Labs    08/09/18 0040 08/09/18 0432 08/09/18 0750  GLUCAP 145* 141* 155*   P:  Sliding-scale insulin protocol  OSA P: [place] while he is intubated Once extubated he will need CPAP   Best practice:  Diet:  tube feeding Pain/Anxiety/Delirium protocol (if indicated): yes, RASS -2 to -3 VAP protocol (if indicated): yes DVT prophylaxis: sub q heparin GI prophylaxis: pantoprazole Glucose control: SSI Mobility: bed rest Code Status: Limited code blue established / no CPR.  Family Communication: Wife updated via phone 4/10.  He may ultimately need trach (have spoke to Dr. Nelda Marseille, possible next week) if he is making progress.  May run into difficulty with disposition if he needs trach / HD for recovery.  08/09/2018 wife Arleigh Odowd updated by telephone.  She was informed abdominal distention and the fact that a GI consult has been called.  She asked that we update her with any further changes in abdominal or GI situation. Disposition: ICU  Labs   CBC: Recent Labs  Lab 08/06/18 0437 08/07/18 0346 08/08/18 0420 08/08/18 1103 08/08/18 1538 08/08/18 1729 08/08/18 1859 08/09/18 0327  WBC 8.8 10.9* 11.2* 11.1*  --   --   --  7.8  HGB 11.5* 12.3* 9.8* 10.8* 13.3 12.9* 12.6* 10.4*  HCT 37.5* 38.9* 32.2* 33.1* 39.0 38.0* 37.0* 32.7*  MCV 93.1 92.8 93.9 95.4  --   --   --  94.5  PLT 191 212 169 190  --   --   --  725    Basic Metabolic Panel: Recent Labs  Lab 08/05/18 0500  08/06/18 0437  08/07/18 0346 08/07/18 1605 08/08/18 0420 08/08/18 0421 08/08/18 1538 08/08/18 1729 08/08/18 1859 08/08/18 1908 08/09/18 0327  NA 137   < > 133*   < > 136 137 137 139 137 139 138 135 138  K 3.9   < > 3.7   < > 4.1 3.9 3.2* 3.1* 4.3 4.2 4.1 4.2 4.3  CL 100   < > 99   < > 101 104 107 110  --   --   --   102 103  CO2 24   < > 22   < > 20* 21* 24 19*  --   --   --  20* 20*  GLUCOSE 133*   < > 110*   < > 151* 153* 130* 126*  --   --   --  162* 164*  BUN 55*   < > 57*   < > 62* 65* 68* 65*  --   --   --  74* 98*  CREATININE 3.59*   < > 3.38*   < > 3.48* 3.54* 3.84* 3.58*  --   --   --  4.10* 5.17*  CALCIUM 8.0*   < > 7.8*   < > 8.4* 8.0* 7.1* 6.8*  --   --   --  8.2* 8.1*  MG 3.0*  --  3.2*  --  3.0*  --  2.6*  --   --   --   --   --  2.7*  PHOS 5.7*   < > 4.2   < > 3.9 3.1  --  2.4*  --   --   --  4.8* 6.2*   < > = values in this interval not displayed.   GFR: Estimated Creatinine Clearance: 22.8 mL/min (A) (by C-G formula based on SCr of 5.17 mg/dL (H)). Recent Labs  Lab 08/07/18 0346 08/08/18 0420 08/08/18 1102 08/08/18 1103 08/08/18 1402 08/09/18 0327  WBC 10.9* 11.2*  --  11.1*  --  7.8  LATICACIDVEN  --   --  1.0  --  1.0  --     Liver Function Tests: Recent Labs  Lab 08/05/18 0500  08/07/18 1605 08/08/18 0420 08/08/18 0421 08/08/18 1908 08/09/18 0327  AST 114*  --   --  61*  --   --   --   ALT 53*  --   --  34  --   --   --   ALKPHOS 66  --   --  82  --   --   --   BILITOT 5.4*  --   --  2.0*  --   --   --   PROT 6.3*  --   --  5.5*  --   --   --   ALBUMIN 2.4*   < > 2.5* 2.0* 1.9* 2.2* 2.0*   < > = values in this interval not displayed.   No results for input(s): LIPASE, AMYLASE in the last 168 hours. No results for input(s): AMMONIA in the last 168 hours.  ABG    Component Value Date/Time   PHART 7.325 (L) 08/08/2018 0852   PCO2ART 39.9 08/08/2018 0852   PO2ART 62.7 (L) 08/08/2018 0852   HCO3 25.7 08/08/2018 1859   TCO2 27 08/08/2018 1859   ACIDBASEDEF 3.0 (H) 08/08/2018 1859   O2SAT 59.0 08/08/2018 1859     Coagulation Profile: No results for input(s): INR, PROTIME in the last 168 hours.  Cardiac Enzymes: No results for input(s): CKTOTAL, CKMB, CKMBINDEX, TROPONINI in the last 168 hours.  HbA1C: No results found for: HGBA1C  CBG: Recent Labs   Lab 08/08/18 1557 08/08/18 2113 08/09/18 0040 08/09/18 0432 08/09/18 0750  GLUCAP 153* 132* 145* 141* 155*    Critical care time: 35 minutes    Richardson Landry Lukus Binion ACNP Maryanna Shape PCCM Pager [phone] till 1 pm If no answer page [phone] 08/09/2018, 11:13 AM

## 2018-08-09 NOTE — Consult Note (Addendum)
[facility] Consult  Referring Provider: Minor, Grace Bushy, NP/ Pulmonary Medicine Primary Care Physician:  Patient, No Pcp Per Primary Gastroenterologist: Althia Forts  Reason for Consultation:  diverticulitis  HPI: [name] Long is a 53 y.o. male was transferred from [facility] ER on 08/13/2018 after testing positive for COVID 19. As per documentation, he had presented with lower abdominal pain, fever, nausea, dry heaving, loss of appetite and diarrhea. CTA from outside facility was reported as "acute atypical infection" and CT abdomen/pelvis showed perforated diverticulitis, localized luminal air, no abscess, hepatic steatosis, possible component of cirrhosis and splenomegaly. As per documentation, he quit smoking about 9 years ago, has history of alcohol use. On admission he has LLQ tenderness, was started on IV zosyn. He developed rapidly progressive hypoxemic respiratory failure and was intubated on 07/30/2018. He developed worsening renal function, presumed due to ATN, possibly related to contrast and CCRT was initiated. He has since developed multiorgan failures and remains critically ill on IV pressors. His code status is now reported as limited, with no CPR or defibrillation. He was not started on tocluzimab due to diverticulitis. He subsequently developed atrial fibrillation and was started on amiodarone on 08/05/2018. He developed hematochezia(dark red blood noted from rectum on 08/08/2018 in the the afternoon) and heparin was discontinued yesterday.   Past Medical History:  Diagnosis Date  . A-fib ([place])    ?  . DM2 (diabetes mellitus, type 2) ([place])   . HLD (hyperlipidemia)   . HTN (hypertension)   . OSA (obstructive sleep apnea)     Past Surgical History:  Procedure Laterality Date  . APPENDECTOMY  age 56  . PALATE / UVULA BIOPSY / EXCISION  2009  . TONSILLECTOMY AND ADENOIDECTOMY  age 64  . TOTAL KNEE ARTHROPLASTY  1996 / 2008   bilateral  . VASECTOMY  1994    Prior  to Admission medications   Medication Sig Start Date End Date Taking? Authorizing Provider  aspirin 81 MG tablet Take 81 mg by mouth daily.     Yes [provider]  atorvastatin (LIPITOR) 40 MG tablet Take 40 mg by mouth daily. 10/24/17  Yes [provider]  buPROPion (WELLBUTRIN SR) 200 MG 12 hr tablet Take 200 mg by mouth 2 (two) times daily.   Yes [provider]  co-enzyme Q-10 30 MG capsule Take 30 mg by mouth daily.   Yes [provider]  empagliflozin (JARDIANCE) 25 MG TABS tablet Take 25 mg by mouth daily.   Yes [provider]  Glucos-Chond-Hyal Ac-Ca Fructo (MOVE FREE JOINT HEALTH ADVANCE PO) Take 1 tablet by mouth daily.   Yes [provider]  metFORMIN (GLUCOPHAGE-XR) 500 MG 24 hr tablet Take 1,000 mg by mouth daily.   Yes [provider]  metoprolol tartrate (LOPRESSOR) 25 MG tablet Take 25 mg by mouth 2 (two) times daily.   Yes [provider]  spironolactone (ALDACTONE) 25 MG tablet Take 25 mg by mouth daily. 11/22/15  Yes [provider]    Current Facility-Administered Medications  Medication Dose Route Frequency Provider Last Rate Last Dose  .  prismasol BGK 4/2.5 infusion   CRRT Continuous Edrick Oh, MD 500 mL/hr at 08/08/18 1309    . 0.9 %  sodium chloride infusion   Intra-arterial PRN Margaretha Seeds, MD 50 mL/hr at 08/08/18 1720    . acetaminophen (TYLENOL) solution 650 mg  650 mg Oral Q6H PRN Polly Cobia, RPH   650 mg at 08/08/18 0743  . albuterol (PROVENTIL  HFA;VENTOLIN HFA) 108 (90 Base) MCG/ACT inhaler 2 puff  2 puff Inhalation Q4H PRN Etta Quill, DO      . amiodarone (NEXTERONE PREMIX) 360-4.14 MG/200ML-% (1.8 mg/mL) IV infusion  30 mg/hr Intravenous Continuous Mannam, Praveen, MD 16.67 mL/hr at 08/09/18 0919 30 mg/hr at 08/09/18 0919  . anticoagulant sodium citrate solution 5 mL  5 mL Intravenous PRN Margaretha Seeds, MD   3.2 mL at 08/09/18 0842  . calcium gluconate 20 g in  dextrose 5 % 1,000 mL infusion  20 g CRRT Continuous Edrick Oh, MD   Stopped at 08/08/18 2204  . chlorhexidine gluconate (MEDLINE KIT) (PERIDEX) 0.12 % solution 15 mL  15 mL Mouth Rinse BID Margaretha Seeds, MD   15 mL at 08/09/18 0900  . Chlorhexidine Gluconate Cloth 2 % PADS 6 each  6 each Topical Daily Margaretha Seeds, MD   6 each at 08/08/18 2137  . Chlorhexidine Gluconate Cloth 2 % PADS 6 each  6 each Topical Daily Margaretha Seeds, MD   6 each at 08/08/18 0825  . dexmedetomidine (PRECEDEX) 400 MCG/100ML (4 mcg/mL) infusion  0.4-1.2 mcg/kg/hr Intravenous Titrated Margaretha Seeds, MD 16.38 mL/hr at 08/09/18 0918 0.5 mcg/kg/hr at 08/09/18 1017  . feeding supplement (PRO-STAT SUGAR FREE 64) liquid 60 mL  60 mL Per Tube BID Mannam, Praveen, MD   60 mL at 08/09/18 0803  . fentaNYL (SUBLIMAZE) bolus via infusion 50 mcg  50 mcg Intravenous Q1H PRN Noe Gens L, NP   50 mcg at 08/06/18 5102  . fentaNYL 2550mg in NS 2594m(1041mml) infusion-PREMIX  25-400 mcg/hr Intravenous Continuous Ollis, Brandi L, NP 32.5 mL/hr at 08/09/18 1047 325 mcg/hr at 08/09/18 1047  . heparin injection 1,000-6,000 Units  1,000-6,000 Units CRRT PRN UptMadelon LipsD   6,000 Units at 08/08/18 2103  . heparinized saline (2000 units/L) primer fluid for CRRT   CRRT PRN WebEdrick OhD      . hydrALAZINE (APRESOLINE) injection 10 mg  10 mg Intravenous Q6H PRN JosDomenic PoliteD   10 mg at 08/05/18 0919  . insulin aspart (novoLOG) injection 0-9 Units  0-9 Units Subcutaneous Q4H GarEtta QuillO   2 Units at 08/09/18 1237  . lip balm (CARMEX) ointment 1 application  1 application Topical PRN JosDomenic PoliteD      . MEDLINE mouth rinse  15 mL Mouth Rinse 10 times per day EllMargaretha SeedsD   15 mL at 08/09/18 1240  . midazolam (VERSED) 50 mg/50 mL (1 mg/mL) premix infusion  0-4 mg/hr Intravenous Continuous OllDonita BrooksP   Stopped at 08/07/18 1809  . midazolam (VERSED) bolus via infusion 1-2 mg  1-2 mg  Intravenous Q2H PRN OllNoe Gens NP   1 mg at 08/06/18 0639  . midazolam (VERSED) injection 1-2 mg  1-2 mg Intravenous Q1H PRN Minor, WilGrace BushyP      . ondansetron (ZOFRAN) tablet 4 mg  4 mg Per Tube Q6H PRN Wofford, Drew A, RPH       Or  . ondansetron (ZOFRAN) injection 4 mg  4 mg Intravenous Q6H PRN Wofford, Drew A, RPH      . oxyCODONE (Oxy IR/ROXICODONE) immediate release tablet 5 mg  5 mg Per Tube Q4H Ollis, Brandi L, NP   5 mg at 08/09/18 0803  . pantoprazole (PROTONIX) injection 40 mg  40 mg Intravenous Q24H YapElsie LincolnD   40 mg at 08/08/18 2114  . phenylephrine (  NEO-SYNEPHRINE) 40 mg in sodium chloride 0.9 % 250 mL (0.16 mg/mL) infusion  0-400 mcg/min Intravenous Titrated Margaretha Seeds, MD 37.5 mL/hr at 08/09/18 1151 100 mcg/min at 08/09/18 1151  . prismasol BGK 4/2.5 infusion   CRRT Continuous Edrick Oh, MD 2,000 mL/hr at 08/08/18 1416    . sodium chloride flush (NS) 0.9 % injection 10-40 mL  10-40 mL Intracatheter Q12H Margaretha Seeds, MD   20 mL at 08/09/18 1000  . sodium chloride flush (NS) 0.9 % injection 10-40 mL  10-40 mL Intracatheter PRN Margaretha Seeds, MD   20 mL at 08/08/18 0540  . sodium citrate 2 %/dextrose 2.5% solution 3000 mL   CRRT Continuous Edrick Oh, MD 250 mL/hr at 08/08/18 1539    . sterile water (preservative free) injection             Allergies as of 08/02/2018  . (No Known Allergies)    Family History  Problem Relation Age of Onset  . Heart disease Other   . Heart disease Father   . Cancer Mother   . Heart disease Mother   . Hypertension Mother     Social History   Socioeconomic History  . Marital status: Married    Spouse name: Not on file  . Number of children: 3  . Years of education: Not on file  . Highest education level: Not on file  Occupational History  . Occupation: Librarian, academic  Social Needs  . Financial resource strain: Not hard at all  . Food insecurity:    Worry: Never true    Inability: Not on file  .  Transportation needs:    Medical: No    Non-medical: No  Tobacco Use  . Smoking status: Former Smoker    Types: Cigarettes    Last attempt to quit: 08/27/2008    Years since quitting: 9.9  . Smokeless tobacco: Never Used  Substance and Sexual Activity  . Alcohol use: Yes    Comment: rare  . Drug use: No  . Sexual activity: Not on file  Lifestyle  . Physical activity:    Days per week: Patient refused    Minutes per session: Patient refused  . Stress: Not on file  Relationships  . Social connections:    Talks on phone: Patient refused    Gets together: Patient refused    Attends religious service: Patient refused    Active member of club or organization: Patient refused    Attends meetings of clubs or organizations: Patient refused    Relationship status: Patient refused  . Intimate partner violence:    Fear of current or ex partner: Patient refused    Emotionally abused: Patient refused    Physically abused: Patient refused    Forced sexual activity: Patient refused  Other Topics Concern  . Not on file  Social History Narrative  . Not on file    Review of Systems: Could not be done.  Physical Exam: Vital signs in last 24 hours: Temp:  [97.7 F (36.5 C)-99.2 F (37.3 C)] 97.9 F (36.6 C) (04/12 1200) Pulse Rate:  [69-92] 92 (04/12 1200) Resp:  [11-37] 11 (04/12 1200) SpO2:  [91 %-99 %] 93 % (04/12 1200) Arterial Line BP: (93-139)/(45-59) 114/49 (04/12 1200) FiO2 (%):  [40 %-50 %] 40 % (04/12 1216) Weight:  [131.2 kg] 131.2 kg (04/12 0500) Last BM Date: 08/08/18  Patient is COVID positive, critically ill with multiorgan failure. He was not seen or examined physically.  Lab Results: Recent Labs    08/08/18 0420 08/08/18 1103  08/08/18 1729 08/08/18 1859 08/09/18 0327  WBC 11.2* 11.1*  --   --   --  7.8  HGB 9.8* 10.8*   < > 12.9* 12.6* 10.4*  HCT 32.2* 33.1*   < > 38.0* 37.0* 32.7*  PLT 169 190  --   --   --  197   < > = values in this interval not  displayed.   BMET Recent Labs    08/08/18 0421  08/08/18 1859 08/08/18 1908 08/09/18 0327  NA 139   < > 138 135 138  K 3.1*   < > 4.1 4.2 4.3  CL 110  --   --  102 103  CO2 19*  --   --  20* 20*  GLUCOSE 126*  --   --  162* 164*  BUN 65*  --   --  74* 98*  CREATININE 3.58*  --   --  4.10* 5.17*  CALCIUM 6.8*  --   --  8.2* 8.1*   < > = values in this interval not displayed.   LFT Recent Labs    08/08/18 0420  08/09/18 0327  PROT 5.5*  --   --   ALBUMIN 2.0*   < > 2.0*  AST 61*  --   --   ALT 34  --   --   ALKPHOS 82  --   --   BILITOT 2.0*  --   --    < > = values in this interval not displayed.   PT/INR No results for input(s): LABPROT, INR in the last 72 hours.  Studies/Results: Dg Abd 1 View  Result Date: 08/08/2018 CLINICAL DATA:  Abdominal distention. H/o Diabetes Type 2. Pt is Covid-19 Positive EXAM: ABDOMEN - 1 VIEW COMPARISON:  None. FINDINGS: Enteric tube appears adequately positioned in the stomach. Paucity of bowel gas. No dilated large or small bowel loops seen. No evidence of free intraperitoneal air seen. No evidence of renal or ureteral calculi. Osseous structures are unremarkable. IMPRESSION: 1. Enteric tube adequately positioned in the stomach. 2. Paucity of bowel gas. No dilated large or small bowel loops seen. Electronically Signed   By: Franki Cabot M.D.   On: 08/08/2018 19:26    Impression: Multiorgan failure, COVID positive, intubated sedated on the vent, renal failure on CRRT, A fib with RVR, on IV pressors Recent diverticulitis?contained perforation on CT at outside facility Was on zosyn from 08/12/2018 to 08/05/2018, restarted today Hematochezia yesterday(small to medium maroon colored BM as per his nurse), Hb dropped from 12.6 to 10.4, patient was on heparin. AXR from yesterday did not show free intraperitoneal air, no small or large bowel distension noted  Plan: Overall prognosis remains extremely guarded. Recommend supportive care and  continuation of IV zosyn to complete a course of 10 days(total). Monitor H and H and transfuse if Hb is less than 7. Recommend Xray to be repeated when he gets CXR for follow up(discussed over the phone with his nurse, patient to get CXR tomorrow). If no further hematochezia noted, resume TF accordingly.    LOS: 11 days   Ronnette Juniper, MD  08/09/2018, 12:45 PM  Pager [phone] If no answer or after 5 PM call [phone]

## 2018-08-09 NOTE — Progress Notes (Signed)
At 2100:  Post-filter iCa 0.47 - Citrate increased by 65ml/hr to 337ml/hr Systemic iCa 1.15 - No change in Ca infusion kept at 71ml/hr  [place] / Rapid Response Nurse Rapid Response Number:  [phone]

## 2018-08-09 NOTE — Progress Notes (Signed)
Notified Pharmacy in regards to 1800 zosyn 2.25 g not being in refrigerator. Pharmacy said they will assist and send up.

## 2018-08-09 NOTE — Progress Notes (Signed)
MD Loanne Drilling ordered for Blood cultures x 2 to be drawn. If unable to draw, then add vancomycin per pharmacy consult.

## 2018-08-09 NOTE — Progress Notes (Signed)
[facility] ROUNDING NOTE   Subjective:   This is a 53 year old gentleman diabetes hypertension obstructive sleep apnea on CPAP.  Admitted with  COVID-19.  He has been treated by CCM with Plaquenil azithromycin.  CRRT was initiated 08/02/2018.  Has had some clotting of the filter and required initiation of heparin with dialysis 08/03/2018.  He has been kept even.  Has been placed back on pressors.  Still not making great progress.  Change to citrate anticoagulation.  Being kept even on CRRT no much change.  Have attempted to pull 100 to 150 cc an hour with little success.  Still being kept even.  Blood pressure 113/56 pulse 84 temperature 97.9 O2 sats 98% FiO2 40%  Chest x-ray shows worsening patchy opacity in right lung base 08/07/2018  Medications IV amiodarone, phenylephrine titrated to maintain systolic blood pressure, and Protonix IV  CT abd from [facility] on 4/1 - R kidney visualized, no hydro noted, probably there is dye in the renal pelvis  Sodium 138 potassium 4.3 chloride 103 CO2 20 BUN 98 creatinine 5.17 glucose 164 calcium 8.1 phosphorus 6.2 magnesium 2.7 WBC 7.8 hemoglobin 10.4 platelets 197  Objective:  Vital signs in last 24 hours:  Temp:  [97.7 F (36.5 C)-99.2 F (37.3 C)] 97.9 F (36.6 C) (04/12 1200) Pulse Rate:  [69-92] 92 (04/12 1200) Resp:  [11-37] 11 (04/12 1200) SpO2:  [91 %-99 %] 93 % (04/12 1200) Arterial Line BP: (100-139)/(45-59) 114/49 (04/12 1200) FiO2 (%):  [40 %-50 %] 40 % (04/12 1216) Weight:  [131.2 kg] 131.2 kg (04/12 0500)  Weight change: 0.9 kg Filed Weights   08/07/18 0500 08/08/18 0435 08/09/18 0500  Weight: 131 kg 130.3 kg 131.2 kg    Intake/Output: I/O last 3 completed shifts: In: 5055.2 [I.V.:3385.7; NG/GT:1669.5] Out: 3184 [Other:3184]   Intake/Output this shift:  Total I/O In: 455.9 [I.V.:265.9; NG/GT:190] Out: -   Patient not physically examined in room being positive for covid 19   Basic Metabolic Panel: Recent Labs   Lab 08/05/18 0500  08/06/18 0437  08/07/18 0346 08/07/18 1605 08/08/18 0420 08/08/18 0421 08/08/18 1538 08/08/18 1729 08/08/18 1859 08/08/18 1908 08/09/18 0327  NA 137   < > 133*   < > 136 137 137 139 137 139 138 135 138  K 3.9   < > 3.7   < > 4.1 3.9 3.2* 3.1* 4.3 4.2 4.1 4.2 4.3  CL 100   < > 99   < > 101 104 107 110  --   --   --  102 103  CO2 24   < > 22   < > 20* 21* 24 19*  --   --   --  20* 20*  GLUCOSE 133*   < > 110*   < > 151* 153* 130* 126*  --   --   --  162* 164*  BUN 55*   < > 57*   < > 62* 65* 68* 65*  --   --   --  74* 98*  CREATININE 3.59*   < > 3.38*   < > 3.48* 3.54* 3.84* 3.58*  --   --   --  4.10* 5.17*  CALCIUM 8.0*   < > 7.8*   < > 8.4* 8.0* 7.1* 6.8*  --   --   --  8.2* 8.1*  MG 3.0*  --  3.2*  --  3.0*  --  2.6*  --   --   --   --   --  2.7*  PHOS 5.7*   < > 4.2   < > 3.9 3.1  --  2.4*  --   --   --  4.8* 6.2*   < > = values in this interval not displayed.    Liver Function Tests: Recent Labs  Lab 08/05/18 0500  08/07/18 1605 08/08/18 0420 08/08/18 0421 08/08/18 1908 08/09/18 0327  AST 114*  --   --  61*  --   --   --   ALT 53*  --   --  34  --   --   --   ALKPHOS 66  --   --  82  --   --   --   BILITOT 5.4*  --   --  2.0*  --   --   --   PROT 6.3*  --   --  5.5*  --   --   --   ALBUMIN 2.4*   < > 2.5* 2.0* 1.9* 2.2* 2.0*   < > = values in this interval not displayed.   No results for input(s): LIPASE, AMYLASE in the last 168 hours. No results for input(s): AMMONIA in the last 168 hours.  CBC: Recent Labs  Lab 08/06/18 0437 08/07/18 0346 08/08/18 0420 08/08/18 1103 08/08/18 1538 08/08/18 1729 08/08/18 1859 08/09/18 0327  WBC 8.8 10.9* 11.2* 11.1*  --   --   --  7.8  HGB 11.5* 12.3* 9.8* 10.8* 13.3 12.9* 12.6* 10.4*  HCT 37.5* 38.9* 32.2* 33.1* 39.0 38.0* 37.0* 32.7*  MCV 93.1 92.8 93.9 95.4  --   --   --  94.5  PLT 191 212 169 190  --   --   --  197    Cardiac Enzymes: No results for input(s): CKTOTAL, CKMB, CKMBINDEX,  TROPONINI in the last 168 hours.  BNP: Invalid input(s): POCBNP  CBG: Recent Labs  Lab 08/08/18 2113 08/09/18 0040 08/09/18 0432 08/09/18 0750 08/09/18 1230  GLUCAP 132* 145* 141* 155* 152*    Microbiology: Results for orders placed or performed during the hospital encounter of 08/05/2018  MRSA PCR Screening     Status: None   Collection Time: 07/30/18  2:30 AM  Result Value Ref Range Status   MRSA by PCR NEGATIVE NEGATIVE Final    Comment:        The GeneXpert MRSA Assay (FDA approved for NASAL specimens only), is one component of a comprehensive MRSA colonization surveillance program. It is not intended to diagnose MRSA infection nor to guide or monitor treatment for MRSA infections. Performed at [facility], [place] [address] [place], [place] [address]     Coagulation Studies: No results for input(s): LABPROT, INR in the last 72 hours.  Urinalysis: No results for input(s): COLORURINE, LABSPEC, PHURINE, GLUCOSEU, HGBUR, BILIRUBINUR, KETONESUR, PROTEINUR, UROBILINOGEN, NITRITE, LEUKOCYTESUR in the last 72 hours.  Invalid input(s): APPERANCEUR    Imaging: Dg Abd 1 View  Result Date: 08/08/2018 CLINICAL DATA:  Abdominal distention. H/o Diabetes Type 2. Pt is Covid-19 Positive EXAM: ABDOMEN - 1 VIEW COMPARISON:  None. FINDINGS: Enteric tube appears adequately positioned in the stomach. Paucity of bowel gas. No dilated large or small bowel loops seen. No evidence of free intraperitoneal air seen. No evidence of renal or ureteral calculi. Osseous structures are unremarkable. IMPRESSION: 1. Enteric tube adequately positioned in the stomach. 2. Paucity of bowel gas. No dilated large or small bowel loops seen. Electronically Signed   By: Franki Cabot M.D.   On:  08/08/2018 19:26     Medications:   .  prismasol BGK 4/2.5 500 mL/hr at 08/08/18 1309  . sodium chloride 50 mL/hr at 08/08/18 1720  . amiodarone 30 mg/hr (08/09/18 1248)  . anticoagulant  sodium citrate    . calcium gluconate infusion for CRRT 20 g (08/09/18 1248)  . dexmedetomidine (PRECEDEX) IV infusion 0.7 mcg/kg/hr (08/09/18 1000)  . fentaNYL infusion INTRAVENOUS 325 mcg/hr (08/09/18 1047)  . midazolam Stopped (08/07/18 1809)  . phenylephrine (NEO-SYNEPHRINE) Adult infusion 100 mcg/min (08/09/18 1151)  . piperacillin-tazobactam (ZOSYN)  IV    . piperacillin-tazobactam (ZOSYN)  IV    . prismasol BGK 4/2.5 2,000 mL/hr at 08/08/18 1416  . sodium citrate 2 %/dextrose 2.5% solution 3000 mL 250 mL/hr at 08/08/18 1539   . chlorhexidine gluconate (MEDLINE KIT)  15 mL Mouth Rinse BID  . Chlorhexidine Gluconate Cloth  6 each Topical Daily  . Chlorhexidine Gluconate Cloth  6 each Topical Daily  . feeding supplement (PRO-STAT SUGAR FREE 64)  60 mL Per Tube BID  . insulin aspart  0-9 Units Subcutaneous Q4H  . mouth rinse  15 mL Mouth Rinse 10 times per day  . oxyCODONE  5 mg Per Tube Q4H  . pantoprazole (PROTONIX) IV  40 mg Intravenous Q24H  . sodium chloride flush  10-40 mL Intracatheter Q12H  . sterile water (preservative free)       Place/Maintain arterial line **AND** sodium chloride, acetaminophen (TYLENOL) oral liquid 160 mg/5 mL, albuterol, anticoagulant sodium citrate, fentaNYL, heparin, heparin, hydrALAZINE, lip balm, midazolam, midazolam, ondansetron **OR** ondansetron (ZOFRAN) IV, sodium chloride flush  Assessment/ Plan:   Acute respiratory failure.  Suspect ATN possibly contrast related.  Worsening respiratory acidosis and metabolic acidosis CRRT initiated.  Metabolic and acid-base parameters appear to be corrected.  Some mild metabolic acidosis due to patient being on and off CRRT due to anticoagulation issues.  Hypokalemia will replete  Clotting of dialysis filter we will try regional anticoagulation with citrate.  Pneumonia secondary to COVID-19 on ventilator with high support  Diabetes mellitus per primary team  Obesity and obstructive sleep apnea  History  of atrial fibrillation patient now back on IV amiodarone  Electrolytes appear to be stable  Acid-base.  Combined metabolic and respiratory acidosis.  Somewhat improved  Volume being kept even at this present time.  We have been attempting to pull fluid but having little success.  Requiring increased pressor support  Disposition appears a little progress is being made at this point continues to be oligo anuric.  Suggest withdrawal of care if no improvement seen within the next 24 to 48 hours.     LOS: [place] '@TODAY' '@1' :10 PM

## 2018-08-09 NOTE — Procedures (Signed)
Hemodialysis Insertion Procedure Note Zahari Xiang [id number] 1966/03/13  Procedure: Insertion of Hemodialysis Catheter Type: 3 port  Indications: Hemodialysis   Procedure Details Consent: Risks of procedure as well as the alternatives and risks of each were explained to the (patient/caregiver).  Consent for procedure obtained. Time Out: Verified patient identification, verified procedure, site/side was marked, verified correct patient position, special equipment/implants available, medications/allergies/relevent history reviewed, required imaging and test results available.  Performed  Maximum sterile technique was used including antiseptics, cap, gloves, gown, hand hygiene, mask and sheet. Skin prep: Chlorhexidine; local anesthetic administered A antimicrobial bonded/coated triple lumen catheter was placed in the right femoral vein due to emergent situation using the Seldinger technique. Ultrasound guidance used.Yes.   Catheter placed to 24 cm. Blood aspirated via all 3 ports and then flushed x 3. Line sutured x 2 and dressing applied.  Evaluation Blood flow good Complications: No apparent complications Patient did tolerate procedure well. Chest X-ray ordered to verify placement.  CXR: not needed  [facility] ACNP Maryanna Shape PCCM Pager [phone] till 1 pm If no answer page [phone] 08/09/2018, 11:10 AM

## 2018-08-10 ENCOUNTER — Inpatient Hospital Stay (HOSPITAL_COMMUNITY): Payer: BLUE CROSS/BLUE SHIELD

## 2018-08-10 LAB — POCT I-STAT 7, (LYTES, BLD GAS, ICA,H+H)
Acid-Base Excess: 1 mmol/L (ref 0.0–2.0)
Acid-Base Excess: 1 mmol/L (ref 0.0–2.0)
Acid-Base Excess: 1 mmol/L (ref 0.0–2.0)
Acid-Base Excess: 1 mmol/L (ref 0.0–2.0)
Acid-Base Excess: 1 mmol/L (ref 0.0–2.0)
Acid-base deficit: 1 mmol/L (ref 0.0–2.0)
Bicarbonate: 24.6 mmol/L (ref 20.0–28.0)
Bicarbonate: 24.6 mmol/L (ref 20.0–28.0)
Bicarbonate: 24.6 mmol/L (ref 20.0–28.0)
Bicarbonate: 24.8 mmol/L (ref 20.0–28.0)
Bicarbonate: 25.1 mmol/L (ref 20.0–28.0)
Bicarbonate: 25.6 mmol/L (ref 20.0–28.0)
Bicarbonate: 27.6 mmol/L (ref 20.0–28.0)
Bicarbonate: 28.6 mmol/L — ABNORMAL HIGH (ref 20.0–28.0)
Bicarbonate: 28.7 mmol/L — ABNORMAL HIGH (ref 20.0–28.0)
Bicarbonate: 28.8 mmol/L — ABNORMAL HIGH (ref 20.0–28.0)
Bicarbonate: 28.8 mmol/L — ABNORMAL HIGH (ref 20.0–28.0)
Bicarbonate: 25.1 mmol/L (ref 20.0–28.0)
Calcium, Ion: 0.4 mmol/L — CL (ref 1.15–1.40)
Calcium, Ion: 0.41 mmol/L — CL (ref 1.15–1.40)
Calcium, Ion: 0.45 mmol/L — CL (ref 1.15–1.40)
Calcium, Ion: 0.5 mmol/L — CL (ref 1.15–1.40)
Calcium, Ion: 0.51 mmol/L — CL (ref 1.15–1.40)
Calcium, Ion: 1.15 mmol/L (ref 1.15–1.40)
Calcium, Ion: 1.15 mmol/L (ref 1.15–1.40)
Calcium, Ion: 1.18 mmol/L (ref 1.15–1.40)
Calcium, Ion: 1.18 mmol/L (ref 1.15–1.40)
Calcium, Ion: 1.18 mmol/L (ref 1.15–1.40)
Calcium, Ion: 1.18 mmol/L (ref 1.15–1.40)
Calcium, Ion: 1.05 mmol/L — ABNORMAL LOW (ref 1.15–1.40)
HCT: 26 % — ABNORMAL LOW (ref 39.0–52.0)
HCT: 26 % — ABNORMAL LOW (ref 39.0–52.0)
HCT: 26 % — ABNORMAL LOW (ref 39.0–52.0)
HCT: 27 % — ABNORMAL LOW (ref 39.0–52.0)
HCT: 27 % — ABNORMAL LOW (ref 39.0–52.0)
HCT: 28 % — ABNORMAL LOW (ref 39.0–52.0)
HCT: 33 % — ABNORMAL LOW (ref 39.0–52.0)
HCT: 33 % — ABNORMAL LOW (ref 39.0–52.0)
HCT: 34 % — ABNORMAL LOW (ref 39.0–52.0)
HCT: 36 % — ABNORMAL LOW (ref 39.0–52.0)
HCT: 36 % — ABNORMAL LOW (ref 39.0–52.0)
HCT: 25 % — ABNORMAL LOW (ref 39.0–52.0)
Hemoglobin: 11.2 g/dL — ABNORMAL LOW (ref 13.0–17.0)
Hemoglobin: 11.2 g/dL — ABNORMAL LOW (ref 13.0–17.0)
Hemoglobin: 11.6 g/dL — ABNORMAL LOW (ref 13.0–17.0)
Hemoglobin: 12.2 g/dL — ABNORMAL LOW (ref 13.0–17.0)
Hemoglobin: 12.2 g/dL — ABNORMAL LOW (ref 13.0–17.0)
Hemoglobin: 8.8 g/dL — ABNORMAL LOW (ref 13.0–17.0)
Hemoglobin: 8.8 g/dL — ABNORMAL LOW (ref 13.0–17.0)
Hemoglobin: 8.8 g/dL — ABNORMAL LOW (ref 13.0–17.0)
Hemoglobin: 9.2 g/dL — ABNORMAL LOW (ref 13.0–17.0)
Hemoglobin: 9.2 g/dL — ABNORMAL LOW (ref 13.0–17.0)
Hemoglobin: 9.5 g/dL — ABNORMAL LOW (ref 13.0–17.0)
Hemoglobin: 8.5 g/dL — ABNORMAL LOW (ref 13.0–17.0)
O2 Saturation: 34 %
O2 Saturation: 35 %
O2 Saturation: 36 %
O2 Saturation: 36 %
O2 Saturation: 38 %
O2 Saturation: 93 %
O2 Saturation: 95 %
O2 Saturation: 96 %
O2 Saturation: 96 %
O2 Saturation: 97 %
O2 Saturation: 97 %
O2 Saturation: 95 %
Patient temperature: 96.7
Patient temperature: 96.7
Patient temperature: 96.6
Patient temperature: 96.8
Patient temperature: 96.6
Patient temperature: 96.6
Patient temperature: 96.6
Patient temperature: 96.6
Patient temperature: 96.6
Patient temperature: 37
Potassium: 3.9 mmol/L (ref 3.5–5.1)
Potassium: 3.9 mmol/L (ref 3.5–5.1)
Potassium: 3.9 mmol/L (ref 3.5–5.1)
Potassium: 3.9 mmol/L (ref 3.5–5.1)
Potassium: 3.9 mmol/L (ref 3.5–5.1)
Potassium: 3.9 mmol/L (ref 3.5–5.1)
Potassium: 4 mmol/L (ref 3.5–5.1)
Potassium: 4 mmol/L (ref 3.5–5.1)
Potassium: 4.1 mmol/L (ref 3.5–5.1)
Potassium: 4.1 mmol/L (ref 3.5–5.1)
Potassium: 4.4 mmol/L (ref 3.5–5.1)
Potassium: 4.2 mmol/L (ref 3.5–5.1)
Pressure control: 1 cmH2O
Sodium: 131 mmol/L — ABNORMAL LOW (ref 135–145)
Sodium: 132 mmol/L — ABNORMAL LOW (ref 135–145)
Sodium: 132 mmol/L — ABNORMAL LOW (ref 135–145)
Sodium: 133 mmol/L — ABNORMAL LOW (ref 135–145)
Sodium: 134 mmol/L — ABNORMAL LOW (ref 135–145)
Sodium: 134 mmol/L — ABNORMAL LOW (ref 135–145)
Sodium: 137 mmol/L (ref 135–145)
Sodium: 137 mmol/L (ref 135–145)
Sodium: 137 mmol/L (ref 135–145)
Sodium: 137 mmol/L (ref 135–145)
Sodium: 138 mmol/L (ref 135–145)
Sodium: 135 mmol/L (ref 135–145)
TCO2: 26 mmol/L (ref 22–32)
TCO2: 26 mmol/L (ref 22–32)
TCO2: 26 mmol/L (ref 22–32)
TCO2: 26 mmol/L (ref 22–32)
TCO2: 26 mmol/L (ref 22–32)
TCO2: 27 mmol/L (ref 22–32)
TCO2: 30 mmol/L (ref 22–32)
TCO2: 30 mmol/L (ref 22–32)
TCO2: 31 mmol/L (ref 22–32)
TCO2: 31 mmol/L (ref 22–32)
TCO2: 31 mmol/L (ref 22–32)
TCO2: 26 mmol/L (ref 22–32)
pCO2 arterial: 36.1 mmHg (ref 32.0–48.0)
pCO2 arterial: 37.1 mmHg (ref 32.0–48.0)
pCO2 arterial: 37.1 mmHg (ref 32.0–48.0)
pCO2 arterial: 37.4 mmHg (ref 32.0–48.0)
pCO2 arterial: 37.8 mmHg (ref 32.0–48.0)
pCO2 arterial: 40.3 mmHg (ref 32.0–48.0)
pCO2 arterial: 58.8 mmHg — ABNORMAL HIGH (ref 32.0–48.0)
pCO2 arterial: 59.7 mmHg — ABNORMAL HIGH (ref 32.0–48.0)
pCO2 arterial: 59.7 mmHg — ABNORMAL HIGH (ref 32.0–48.0)
pCO2 arterial: 60.4 mmHg — ABNORMAL HIGH (ref 32.0–48.0)
pCO2 arterial: 64.7 mmHg — ABNORMAL HIGH (ref 32.0–48.0)
pCO2 arterial: 39.2 mmHg (ref 32.0–48.0)
pH, Arterial: 7.238 — ABNORMAL LOW (ref 7.350–7.450)
pH, Arterial: 7.28 — ABNORMAL LOW (ref 7.350–7.450)
pH, Arterial: 7.282 — ABNORMAL LOW (ref 7.350–7.450)
pH, Arterial: 7.286 — ABNORMAL LOW (ref 7.350–7.450)
pH, Arterial: 7.292 — ABNORMAL LOW (ref 7.350–7.450)
pH, Arterial: 7.41 (ref 7.350–7.450)
pH, Arterial: 7.418 (ref 7.350–7.450)
pH, Arterial: 7.425 (ref 7.350–7.450)
pH, Arterial: 7.425 (ref 7.350–7.450)
pH, Arterial: 7.43 (ref 7.350–7.450)
pH, Arterial: 7.44 (ref 7.350–7.450)
pH, Arterial: 7.415 (ref 7.350–7.450)
pO2, Arterial: 22 mmHg — CL (ref 83.0–108.0)
pO2, Arterial: 23 mmHg — CL (ref 83.0–108.0)
pO2, Arterial: 24 mmHg — CL (ref 83.0–108.0)
pO2, Arterial: 24 mmHg — CL (ref 83.0–108.0)
pO2, Arterial: 25 mmHg — CL (ref 83.0–108.0)
pO2, Arterial: 61 mmHg — ABNORMAL LOW (ref 83.0–108.0)
pO2, Arterial: 69 mmHg — ABNORMAL LOW (ref 83.0–108.0)
pO2, Arterial: 78 mmHg — ABNORMAL LOW (ref 83.0–108.0)
pO2, Arterial: 80 mmHg — ABNORMAL LOW (ref 83.0–108.0)
pO2, Arterial: 84 mmHg (ref 83.0–108.0)
pO2, Arterial: 87 mmHg (ref 83.0–108.0)
pO2, Arterial: 75 mmHg — ABNORMAL LOW (ref 83.0–108.0)

## 2018-08-10 LAB — POCT I-STAT EG7
Acid-Base Excess: 1 mmol/L (ref 0.0–2.0)
Acid-Base Excess: 2 mmol/L (ref 0.0–2.0)
Acid-base deficit: 3 mmol/L — ABNORMAL HIGH (ref 0.0–2.0)
Acid-base deficit: 2 mmol/L (ref 0.0–2.0)
Bicarbonate: 27.5 mmol/L (ref 20.0–28.0)
Bicarbonate: 27.4 mmol/L (ref 20.0–28.0)
Bicarbonate: 28.4 mmol/L — ABNORMAL HIGH (ref 20.0–28.0)
Bicarbonate: 29.3 mmol/L — ABNORMAL HIGH (ref 20.0–28.0)
Calcium, Ion: <0.3 mmol/L — CL (ref 1.15–1.40)
Calcium, Ion: 0.3 mmol/L — CL (ref 1.15–1.40)
Calcium, Ion: 0.42 mmol/L — CL (ref 1.15–1.40)
Calcium, Ion: 0.38 mmol/L — CL (ref 1.15–1.40)
HCT: 35 % — ABNORMAL LOW (ref 39.0–52.0)
HCT: 35 % — ABNORMAL LOW (ref 39.0–52.0)
HCT: 34 % — ABNORMAL LOW (ref 39.0–52.0)
HCT: 34 % — ABNORMAL LOW (ref 39.0–52.0)
Hemoglobin: 11.9 g/dL — ABNORMAL LOW (ref 13.0–17.0)
Hemoglobin: 11.9 g/dL — ABNORMAL LOW (ref 13.0–17.0)
Hemoglobin: 11.6 g/dL — ABNORMAL LOW (ref 13.0–17.0)
Hemoglobin: 11.6 g/dL — ABNORMAL LOW (ref 13.0–17.0)
O2 Saturation: 47 %
O2 Saturation: 59 %
O2 Saturation: 62 %
O2 Saturation: 63 %
Patient temperature: 96.5
Patient temperature: 97.7
Potassium: 3.8 mmol/L (ref 3.5–5.1)
Potassium: 3.9 mmol/L (ref 3.5–5.1)
Potassium: 4.1 mmol/L (ref 3.5–5.1)
Potassium: 4 mmol/L (ref 3.5–5.1)
Sodium: 138 mmol/L (ref 135–145)
Sodium: 137 mmol/L (ref 135–145)
Sodium: 137 mmol/L (ref 135–145)
Sodium: 137 mmol/L (ref 135–145)
TCO2: 30 mmol/L (ref 22–32)
TCO2: 29 mmol/L (ref 22–32)
TCO2: 30 mmol/L (ref 22–32)
TCO2: 31 mmol/L (ref 22–32)
pCO2, Ven: 80.4 mmHg (ref 44.0–60.0)
pCO2, Ven: 69.1 mmHg — ABNORMAL HIGH (ref 44.0–60.0)
pCO2, Ven: 59.5 mmHg (ref 44.0–60.0)
pCO2, Ven: 58.2 mmHg (ref 44.0–60.0)
pH, Ven: 7.136 — CL (ref 7.250–7.430)
pH, Ven: 7.207 — ABNORMAL LOW (ref 7.250–7.430)
pH, Ven: 7.287 (ref 7.250–7.430)
pH, Ven: 7.307 (ref 7.250–7.430)
pO2, Ven: 32 mmHg (ref 32.0–45.0)
pO2, Ven: 38 mmHg (ref 32.0–45.0)
pO2, Ven: 37 mmHg (ref 32.0–45.0)
pO2, Ven: 36 mmHg (ref 32.0–45.0)

## 2018-08-10 LAB — RENAL FUNCTION PANEL
Albumin: 1.9 g/dL — ABNORMAL LOW (ref 3.5–5.0)
Albumin: 1.9 g/dL — ABNORMAL LOW (ref 3.5–5.0)
Anion gap: 15 (ref 5–15)
Anion gap: 13 (ref 5–15)
BUN: 84 mg/dL — ABNORMAL HIGH (ref 6–20)
BUN: 83 mg/dL — ABNORMAL HIGH (ref 6–20)
CO2: 20 mmol/L — ABNORMAL LOW (ref 22–32)
CO2: 22 mmol/L (ref 22–32)
Calcium: 8.6 mg/dL — ABNORMAL LOW (ref 8.9–10.3)
Calcium: 8.4 mg/dL — ABNORMAL LOW (ref 8.9–10.3)
Chloride: 99 mmol/L (ref 98–111)
Chloride: 97 mmol/L — ABNORMAL LOW (ref 98–111)
Creatinine, Ser: 4.1 mg/dL — ABNORMAL HIGH (ref 0.61–1.24)
Creatinine, Ser: 4.19 mg/dL — ABNORMAL HIGH (ref 0.61–1.24)
GFR calc Af Amer: 18 mL/min — ABNORMAL LOW (ref >60)
GFR calc Af Amer: 18 mL/min — ABNORMAL LOW (ref >60)
GFR calc non Af Amer: 16 mL/min — ABNORMAL LOW (ref >60)
GFR calc non Af Amer: 15 mL/min — ABNORMAL LOW (ref >60)
Glucose, Bld: 164 mg/dL — ABNORMAL HIGH (ref 70–99)
Glucose, Bld: 137 mg/dL — ABNORMAL HIGH (ref 70–99)
Phosphorus: 4.7 mg/dL — ABNORMAL HIGH (ref 2.5–4.6)
Phosphorus: 4.8 mg/dL — ABNORMAL HIGH (ref 2.5–4.6)
Potassium: 3.9 mmol/L (ref 3.5–5.1)
Potassium: 4.3 mmol/L (ref 3.5–5.1)
Sodium: 134 mmol/L — ABNORMAL LOW (ref 135–145)
Sodium: 132 mmol/L — ABNORMAL LOW (ref 135–145)

## 2018-08-10 LAB — PHOSPHORUS: Phosphorus: 4.6 mg/dL (ref 2.5–4.6)

## 2018-08-10 LAB — CBC WITH DIFFERENTIAL/PLATELET
Abs Immature Granulocytes: 0.19 10*3/uL — ABNORMAL HIGH (ref 0.00–0.07)
Basophils Absolute: 0 10*3/uL (ref 0.0–0.1)
Basophils Relative: 0 %
Eosinophils Absolute: 0 10*3/uL (ref 0.0–0.5)
Eosinophils Relative: 1 %
HCT: 31.1 % — ABNORMAL LOW (ref 39.0–52.0)
Hemoglobin: 10 g/dL — ABNORMAL LOW (ref 13.0–17.0)
Immature Granulocytes: 3 %
Lymphocytes Relative: 6 %
Lymphs Abs: 0.4 10*3/uL — ABNORMAL LOW (ref 0.7–4.0)
MCH: 30.1 pg (ref 26.0–34.0)
MCHC: 32.2 g/dL (ref 30.0–36.0)
MCV: 93.7 fL (ref 80.0–100.0)
Monocytes Absolute: 0.6 10*3/uL (ref 0.1–1.0)
Monocytes Relative: 9 %
Neutro Abs: 4.9 10*3/uL (ref 1.7–7.7)
Neutrophils Relative %: 81 %
Platelets: 133 10*3/uL — ABNORMAL LOW (ref 150–400)
RBC: 3.32 MIL/uL — ABNORMAL LOW (ref 4.22–5.81)
RDW: 19.6 % — ABNORMAL HIGH (ref 11.5–15.5)
WBC: 6 10*3/uL (ref 4.0–10.5)
nRBC: 0.7 % — ABNORMAL HIGH (ref 0.0–0.2)

## 2018-08-10 LAB — CALCIUM, IONIZED: Calcium, Ionized, Serum: 4.4 mg/dL — ABNORMAL LOW (ref 4.5–5.6)

## 2018-08-10 LAB — BASIC METABOLIC PANEL
Anion gap: 13 (ref 5–15)
BUN: 83 mg/dL — ABNORMAL HIGH (ref 6–20)
CO2: 21 mmol/L — ABNORMAL LOW (ref 22–32)
Calcium: 8.7 mg/dL — ABNORMAL LOW (ref 8.9–10.3)
Chloride: 99 mmol/L (ref 98–111)
Creatinine, Ser: 4.06 mg/dL — ABNORMAL HIGH (ref 0.61–1.24)
GFR calc Af Amer: 18 mL/min — ABNORMAL LOW (ref >60)
GFR calc non Af Amer: 16 mL/min — ABNORMAL LOW (ref >60)
Glucose, Bld: 165 mg/dL — ABNORMAL HIGH (ref 70–99)
Potassium: 3.9 mmol/L (ref 3.5–5.1)
Sodium: 133 mmol/L — ABNORMAL LOW (ref 135–145)

## 2018-08-10 LAB — APTT: aPTT: 28 seconds (ref 24–36)

## 2018-08-10 LAB — POCT ACTIVATED CLOTTING TIME: Activated Clotting Time: 180 seconds

## 2018-08-10 LAB — GLUCOSE, CAPILLARY
Glucose-Capillary: 114 mg/dL — ABNORMAL HIGH (ref 70–99)
Glucose-Capillary: 122 mg/dL — ABNORMAL HIGH (ref 70–99)
Glucose-Capillary: 132 mg/dL — ABNORMAL HIGH (ref 70–99)
Glucose-Capillary: 139 mg/dL — ABNORMAL HIGH (ref 70–99)
Glucose-Capillary: 151 mg/dL — ABNORMAL HIGH (ref 70–99)
Glucose-Capillary: 157 mg/dL — ABNORMAL HIGH (ref 70–99)

## 2018-08-10 LAB — MAGNESIUM: Magnesium: 2.2 mg/dL (ref 1.7–2.4)

## 2018-08-10 MED ORDER — ACETAMINOPHEN 160 MG/5ML PO SOLN
650.0000 mg | Freq: Four times a day (QID) | ORAL | Status: DC | PRN
  Administered 2018-08-10 – 2018-08-20 (×6): 650 mg
  Filled 2018-08-10 (×7): qty 20.3

## 2018-08-10 MED ORDER — PHENYLEPHRINE HCL-NACL 40-0.9 MG/250ML-% IV SOLN
0.0000 ug/min | INTRAVENOUS | Status: DC
  Administered 2018-08-10: 80 ug/min via INTRAVENOUS
  Administered 2018-08-10: 17:00:00 120 ug/min via INTRAVENOUS
  Administered 2018-08-10: 110 ug/min via INTRAVENOUS
  Administered 2018-08-11: 05:00:00 100 ug/min via INTRAVENOUS
  Administered 2018-08-11: 90 ug/min via INTRAVENOUS
  Filled 2018-08-10 (×7): qty 250

## 2018-08-10 NOTE — Progress Notes (Signed)
Patient's chart reviewed. He has pneumonia and hypoxia from Covid 19. He has perforated diverticulitis. Not a surgical candidate per surgery. He is on antibiotics. Nothing further to recommend specifically from a GI standpoint at this time. Further treatment as per critical care team. We will sign off. Call us if needed.

## 2018-08-10 NOTE — Progress Notes (Signed)
[facility] Progress Note Patient Name: [name] Long DOB: [birth year] MRN: [id number]   Date of Service  08/10/2018  HPI/Events of Note  Camera: Discussed with bed side RN. Had episode of brady and sats down. CRRT on citrate gets clogging on and off. Want me to call wife to update. fio2 increased to 60%.   eICU Interventions  Sats back up to 99, HR 66.. SBP good. Labs, note _GI seen. Updated patient wife [name] Long [phone] about events and GI input. Hg up 12. No bleeding.   Continue care.      Intervention Category Intermediate Interventions: Arrhythmia - evaluation and management  Elmer Sow 08/10/2018, 2:01 AM

## 2018-08-10 NOTE — Progress Notes (Signed)
[facility] ROUNDING NOTE   Subjective:   This is a 53 year old gentleman diabetes hypertension obstructive sleep apnea on CPAP.  Admitted with  COVID-19.  He has been treated by CCM with Plaquenil azithromycin.  CRRT was initiated 08/02/2018.  Has had some clotting of the filter and required initiation of heparin with dialysis 08/03/2018.  He has been kept even.  Has been placed back on pressors.  Still not making great progress.  Changed to citrate anticoagulation.   Net I/O since starting CRRT on 4/5 is -2.4 L total.    Being kept even on CRRT not much change.  +1.3 L yesterday.   Per RN last night pt brady'd down and had short PEA event but did not arrest and came out of it on his own.    Chest x-ray unchanged patchy opacities.    Medications IV amiodarone, phenylephrine titrated to maintain systolic blood pressure  CT abd from [facility] on 4/1 - R kidney visualized, no hydro noted, probably there is dye in the renal pelvis   Objective:  Vital signs in last 24 hours:  Temp:  [96.1 F (35.6 C)-97.9 F (36.6 C)] 96.6 F (35.9 C) (04/13 0400) Pulse Rate:  [55-92] 66 (04/13 0700) Resp:  [11-38] 35 (04/13 0700) BP: (119-136)/(64-81) 136/81 (04/13 0000) SpO2:  [91 %-100 %] 100 % (04/13 0700) Arterial Line BP: (68-168)/(43-82) 108/52 (04/13 0700) FiO2 (%):  [40 %-60 %] 60 % (04/13 0400) Weight:  [139.4 kg] 139.4 kg (04/13 0329)  Weight change: 8.2 kg Filed Weights   08/08/18 0435 08/09/18 0500 08/10/18 0329  Weight: 130.3 kg 131.2 kg (!) 139.4 kg    Intake/Output: I/O last 3 completed shifts: In: 6520.6 [I.V.:4648.7; NG/GT:1235; IV Piggyback:636.9] Out: 2458 [Emesis/NG output:1050; KDXIP:3825]   Intake/Output this shift:  No intake/output data recorded.  Patient not physically examined in room due to patient being + for covid 19. Exam findings derived from RN, staff assessments and primary team assessments.   Assessment/ Plan:   Acute respiratory failure.  Suspect  ATN possibly contrast related.  Worsening respiratory acidosis and metabolic acidosis >> CRRT initiated 08/02/18. Cont CRRT for now.  Prognosis seems poor.   Hypokalemia will replete  Clotting of dialysis filter - better on regional anticoagulation with citrate.  Pneumonia secondary to COVID-19 on ventilator with high support  Diabetes mellitus per primary team  Obesity and obstructive sleep apnea  History of atrial fibrillation patient now back on IV amiodarone  Electrolytes appear to be stable  Acid-base.  Combined metabolic and respiratory acidosis.  Somewhat improved  Volume being kept even at this present time.  We have been attempting to pull fluid but having little success.  Requiring increased pressor support.      LOS: 12 Sandy Salaam Lamiracle Chaidez '@TODAY' '@8' :03 AM  Basic Metabolic Panel: Recent Labs  Lab 08/06/18 0437  08/07/18 0346  08/08/18 0420 08/08/18 0421  08/08/18 1908 08/09/18 0327  08/09/18 1600  08/09/18 1902 08/09/18 1910 08/09/18 2103 08/09/18 2111 08/10/18 0220  NA 133*   < > 136   < > 137 139   < > 135 138   < > 137   < > 139 137 138 138 133*  134*  K 3.7   < > 4.1   < > 3.2* 3.1*   < > 4.2 4.3   < > 4.1   < > 3.9 4.0 3.9 3.8 3.9  3.9  CL 99   < > 101   < > 107 110  --  102 103  --  102  --   --   --   --   --  99  99  CO2 22   < > 20*   < > 24 19*  --  20* 20*  --  21*  --   --   --   --   --  21*  20*  GLUCOSE 110*   < > 151*   < > 130* 126*  --  162* 164*  --  171*  --   --   --   --   --  165*  164*  BUN 57*   < > 62*   < > 68* 65*  --  74* 98*  --  98*  --   --   --   --   --  83*  84*  CREATININE 3.38*   < > 3.48*   < > 3.84* 3.58*  --  4.10* 5.17*  --  4.95*  --   --   --   --   --  4.06*  4.10*  CALCIUM 7.8*   < > 8.4*   < > 7.1* 6.8*  --  8.2* 8.1*  --  8.6*  --   --   --   --   --  8.7*  8.6*  MG 3.2*  --  3.0*  --  2.6*  --   --   --  2.7*  --   --   --   --   --   --   --  2.2  PHOS 4.2   < > 3.9   < >  --  2.4*  --  4.8* 6.2*  --  5.4*   --   --   --   --   --  4.6  4.7*   < > = values in this interval not displayed.    Liver Function Tests: Recent Labs  Lab 08/05/18 0500  08/08/18 0420 08/08/18 0421 08/08/18 1908 08/09/18 0327 08/09/18 1600 08/10/18 0220  AST 114*  --  61*  --   --   --   --   --   ALT 53*  --  34  --   --   --   --   --   ALKPHOS 66  --  82  --   --   --   --   --   BILITOT 5.4*  --  2.0*  --   --   --   --   --   PROT 6.3*  --  5.5*  --   --   --   --   --   ALBUMIN 2.4*   < > 2.0* 1.9* 2.2* 2.0* 2.2* 1.9*   < > = values in this interval not displayed.   No results for input(s): LIPASE, AMYLASE in the last 168 hours. No results for input(s): AMMONIA in the last 168 hours.  CBC: Recent Labs  Lab 08/07/18 0346 08/08/18 0420 08/08/18 1103  08/09/18 0327  08/09/18 1902 08/09/18 1910 08/09/18 2103 08/09/18 2111 08/10/18 0220  WBC 10.9* 11.2* 11.1*  --  7.8  --   --   --   --   --  6.0  NEUTROABS  --   --   --   --   --   --   --   --   --   --  4.9  HGB 12.3* 9.8* 10.8*   < >  10.4*   < > 11.9* 9.9* 10.2* 12.6* 10.0*  HCT 38.9* 32.2* 33.1*   < > 32.7*   < > 35.0* 29.0* 30.0* 37.0* 31.1*  MCV 92.8 93.9 95.4  --  94.5  --   --   --   --   --  93.7  PLT 212 169 190  --  197  --   --   --   --   --  133*   < > = values in this interval not displayed.    Cardiac Enzymes: No results for input(s): CKTOTAL, CKMB, CKMBINDEX, TROPONINI in the last 168 hours.  BNP: Invalid input(s): POCBNP  CBG: Recent Labs  Lab 08/09/18 1230 08/09/18 1634 08/09/18 2004 08/10/18 0013 08/10/18 0303  GLUCAP 152* 158* 139* 151* 157*    Microbiology: Results for orders placed or performed during the hospital encounter of 08/13/2018  MRSA PCR Screening     Status: None   Collection Time: 07/30/18  2:30 AM  Result Value Ref Range Status   MRSA by PCR NEGATIVE NEGATIVE Final    Comment:        The GeneXpert MRSA Assay (FDA approved for NASAL specimens only), is one component of a comprehensive MRSA  colonization surveillance program. It is not intended to diagnose MRSA infection nor to guide or monitor treatment for MRSA infections. Performed at [facility], [place] [address], [place], [place] [address]     Coagulation Studies: No results for input(s): LABPROT, INR in the last 72 hours.  Urinalysis: No results for input(s): COLORURINE, LABSPEC, PHURINE, GLUCOSEU, HGBUR, BILIRUBINUR, KETONESUR, PROTEINUR, UROBILINOGEN, NITRITE, LEUKOCYTESUR in the last 72 hours.  Invalid input(s): APPERANCEUR    Imaging: Dg Abd 1 View  Result Date: 08/08/2018 CLINICAL DATA:  Abdominal distention. H/o Diabetes Type 2. Pt is Covid-19 Positive EXAM: ABDOMEN - 1 VIEW COMPARISON:  None. FINDINGS: Enteric tube appears adequately positioned in the stomach. Paucity of bowel gas. No dilated large or small bowel loops seen. No evidence of free intraperitoneal air seen. No evidence of renal or ureteral calculi. Osseous structures are unremarkable. IMPRESSION: 1. Enteric tube adequately positioned in the stomach. 2. Paucity of bowel gas. No dilated large or small bowel loops seen. Electronically Signed   By: Franki Cabot M.D.   On: 08/08/2018 19:26     Medications:   .  prismasol BGK 4/2.5 500 mL/hr at 08/10/18 0341  . sodium chloride Stopped (08/10/18 0310)  . amiodarone 30 mg/hr (08/10/18 0600)  . anticoagulant sodium citrate    . calcium gluconate infusion for CRRT 30 mL/hr at 08/10/18 0700  . dexmedetomidine (PRECEDEX) IV infusion 0.7 mcg/kg/hr (08/10/18 0600)  . fentaNYL infusion INTRAVENOUS 400 mcg/hr (08/10/18 0600)  . midazolam 4 mg/hr (08/10/18 0600)  . phenylephrine (NEO-SYNEPHRINE) Adult infusion    . piperacillin-tazobactam (ZOSYN)  IV 2.25 g (08/10/18 1937)  . prismasol BGK 4/2.5 2,000 mL/hr at 08/10/18 0625  . sodium citrate 2 %/dextrose 2.5% solution 3000 mL 280 mL/hr at 08/09/18 1628  . vancomycin     . chlorhexidine gluconate (MEDLINE KIT)  15 mL Mouth Rinse BID   . Chlorhexidine Gluconate Cloth  6 each Topical Daily  . Chlorhexidine Gluconate Cloth  6 each Topical Daily  . feeding supplement (PRO-STAT SUGAR FREE 64)  60 mL Per Tube BID  . insulin aspart  0-9 Units Subcutaneous Q4H  . mouth rinse  15 mL Mouth Rinse 10 times per day  . oxyCODONE  5 mg Per Tube Q4H  .  pantoprazole (PROTONIX) IV  40 mg Intravenous Q24H  . sodium chloride flush  10-40 mL Intracatheter Q12H   Place/Maintain arterial line **AND** sodium chloride, acetaminophen (TYLENOL) oral liquid 160 mg/5 mL, albuterol, anticoagulant sodium citrate, fentaNYL, hydrALAZINE, lip balm, midazolam, midazolam, ondansetron **OR** ondansetron (ZOFRAN) IV, sodium chloride flush

## 2018-08-10 NOTE — Progress Notes (Signed)
Patient ID: [name] Long, male   DOB: May 01, 1965, 53 y.o.   MRN: [id number] We were notified about patient on arrival with ct from [place] showing perforated diverticulitis locally with some air.  He is on abx.  Ill from covid on crrt, possible gi bleed, pea last night.  At some point if he continues to survive and does better a repeat ct scan would be appropriate. He is not an operative candidate for this.  Please call us back if anything is needed.

## 2018-08-10 NOTE — Progress Notes (Signed)
NAME:  [name] Long, MRN:  [id number], DOB:  1965-10-24, LOS: 12 ADMISSION DATE:  08/01/2018, CONSULTATION DATE:  07/30/2018 REFERRING MD:  07/30/2018, CHIEF COMPLAINT:   Brief History   53 yo male presented to [facility] with several days of abdominal pain from diverticulitis with retained perforation.  Developed pneumonia with hypoxia from [place].  Transferred to [facility].  Past Medical History  DM2, HTN, Former smoker, OSA, A fib  [place] [facility] Events   4/01 admission for diverticulitis 4/02 PCCM consulted for worsening dyspnea, hypoxemia 4/02 Proned  4/03 Proned  4/05 Improved oxygenation however ABG with respiratory and metabolic acidosis. Poor UOP 4/06 Supine; completed plaquenil 4/07 CVVHD, difficulty with filters clotting  4/08 CVVHD ongoing, AFwRVR afternoon of 4/8, started on amio gtt   4/12 hemodialysis catheter changed to right femoral location; brief episode of PEA resolved sponateously  Consults:  Renal GI Surgery  Procedures:  ETT 4/2 >>  L IJ HD 4/6 >> 4/12 R Rad Aline 4/6 >>  4/12 right femoral HD catheter 24 cm>>  Significant Diagnostic Tests:  CT chest 4/01 [name]) >> b/l GGO in periphery CT abd/pelvis 4/01 [name]) >> perforated diverticulum lower descending colon, fatty liver with ?cirrhosis, splenomegaly 18.4 cm  Micro Data:  COVID 3/30 (PCP office) >> DETECTED Blood 4/12 >>  Antimicrobials:  Zosyn 4/1 >> 4/7 Zoysn 4/12 >>  Interim history/subjective:  Remains on vent.  Objective   Blood pressure (!) 99/54, pulse 77, temperature (!) 96.8 F (36 C), temperature source Axillary, resp. rate 13, height 5\' 10"  (1.778 m), weight (!) 139.4 kg, SpO2 97 %. CVP:  [12 mmHg-18 mmHg] 18 mmHg  Vent Mode: PRVC FiO2 (%):  [40 %-60 %] 60 % Set Rate:  [30 bmp-35 bmp] 30 bmp Vt Set:  [510 mL] 510 mL PEEP:  [10 cmH20] 10 cmH20 Plateau Pressure:  [24 cmH20-25 cmH20] 24 cmH20   Intake/Output Summary (Last 24 hours) at 08/10/2018 1003 Last data filed at  08/10/2018 0900 Gross per 24 hour  Intake 4136.48 ml  Output 5147 ml  Net -1010.52 ml   Filed Weights   08/08/18 0435 08/09/18 0500 08/10/18 0329  Weight: 130.3 kg 131.2 kg (!) 139.4 kg   Physical Exam:  General - sedated Eyes - pupils reactive ENT - ETT in place Cardiac - regular rate/rhythm, no murmur Chest - scattered rhonchi Abdomen - soft, mild distention, decreased bowel sounds Extremities - 1+ edema Skin - erosion on nose Neuro - RASS -3  CXR - basilar ASD (reviewed by me)   Resolved Hospital Problem list   Mixed Acidosis   Assessment & Plan:   Acute hypoxic respiratory failure with ARDS from COVID 19 pneumonia. Hx of OSA. Discussion: Not candidate for tocilizumab due to diverticulitis. Plan - full vent support - f/u CXR intermittently  Perforated diverticulum. Hematochezia. Plan - continue ABx - not surgical candidate at this time  AKI from ATN. Plan - CRRT per renal  DM type II. Plan - SSI  Paroxysmal A fib (present prior to admission) Plan - amiodarone  Acute metabolic encephalopathy. Plan - RASS goal -1 to -2   Best practice:  Diet: NPO DVT prophylaxis: SCDs GI prophylaxis: pantoprazole Mobility: bed rest Code Status: No CPR, defibrillation  Family Communication: no family at bedside Disposition: ICU  Labs    CMP Latest Ref Rng & Units 08/10/2018 08/10/2018 08/09/2018  Glucose 70 - 99 mg/dL 165(H) 164(H) -  BUN 6 - 20 mg/dL 83(H) 84(H) -  Creatinine 0.61 - 1.24 mg/dL 4.06(H)  4.10(H) -  Sodium 135 - 145 mmol/L 133(L) 134(L) 138  Potassium 3.5 - 5.1 mmol/L 3.9 3.9 3.8  Chloride 98 - 111 mmol/L 99 99 -  CO2 22 - 32 mmol/L 21(L) 20(L) -  Calcium 8.9 - 10.3 mg/dL 8.7(L) 8.6(L) -  Total Protein 6.5 - 8.1 g/dL - - -  Total Bilirubin 0.3 - 1.2 mg/dL - - -  Alkaline Phos 38 - 126 U/L - - -  AST 15 - 41 U/L - - -  ALT 0 - 44 U/L - - -   CBC Latest Ref Rng & Units 08/10/2018 08/09/2018 08/09/2018  WBC 4.0 - 10.5 K/uL 6.0 - -   Hemoglobin 13.0 - 17.0 g/dL 10.0(L) 12.6(L) 10.2(L)  Hematocrit 39.0 - 52.0 % 31.1(L) 37.0(L) 30.0(L)  Platelets 150 - 400 K/uL 133(L) - -   ABG    Component Value Date/Time   PHART 7.373 08/09/2018 1910   PCO2ART 39.3 08/09/2018 1910   PO2ART 73.0 (L) 08/09/2018 1910   HCO3 27.7 08/09/2018 2111   TCO2 29 08/09/2018 2111   ACIDBASEDEF 2.0 08/09/2018 2103   O2SAT 36.0 08/09/2018 2111   CBG (last 3)  Recent Labs    08/09/18 2004 08/10/18 0013 08/10/18 0303  GLUCAP 139* 151* 157*    CC time 34 minutes  Chesley Mires, MD [place] 08/10/2018, 10:17 AM

## 2018-08-11 ENCOUNTER — Encounter (HOSPITAL_COMMUNITY): Payer: Self-pay | Admitting: Nephrology

## 2018-08-11 LAB — RENAL FUNCTION PANEL
Albumin: 2.2 g/dL — ABNORMAL LOW (ref 3.5–5.0)
Albumin: 1.7 g/dL — ABNORMAL LOW (ref 3.5–5.0)
Anion gap: 13 (ref 5–15)
Anion gap: 10 (ref 5–15)
BUN: 78 mg/dL — ABNORMAL HIGH (ref 6–20)
BUN: 80 mg/dL — ABNORMAL HIGH (ref 6–20)
CO2: 25 mmol/L (ref 22–32)
CO2: 21 mmol/L — ABNORMAL LOW (ref 22–32)
Calcium: 8.7 mg/dL — ABNORMAL LOW (ref 8.9–10.3)
Calcium: 6.8 mg/dL — ABNORMAL LOW (ref 8.9–10.3)
Chloride: 96 mmol/L — ABNORMAL LOW (ref 98–111)
Chloride: 107 mmol/L (ref 98–111)
Creatinine, Ser: 3.91 mg/dL — ABNORMAL HIGH (ref 0.61–1.24)
Creatinine, Ser: 4.15 mg/dL — ABNORMAL HIGH (ref 0.61–1.24)
GFR calc Af Amer: 19 mL/min — ABNORMAL LOW (ref >60)
GFR calc Af Amer: 18 mL/min — ABNORMAL LOW (ref >60)
GFR calc non Af Amer: 17 mL/min — ABNORMAL LOW (ref >60)
GFR calc non Af Amer: 15 mL/min — ABNORMAL LOW (ref >60)
Glucose, Bld: 165 mg/dL — ABNORMAL HIGH (ref 70–99)
Glucose, Bld: 127 mg/dL — ABNORMAL HIGH (ref 70–99)
Phosphorus: 5 mg/dL — ABNORMAL HIGH (ref 2.5–4.6)
Phosphorus: 5.7 mg/dL — ABNORMAL HIGH (ref 2.5–4.6)
Potassium: 4.6 mmol/L (ref 3.5–5.1)
Potassium: 4.4 mmol/L (ref 3.5–5.1)
Sodium: 134 mmol/L — ABNORMAL LOW (ref 135–145)
Sodium: 138 mmol/L (ref 135–145)

## 2018-08-11 LAB — POCT I-STAT 7, (LYTES, BLD GAS, ICA,H+H)
Acid-Base Excess: 4 mmol/L — ABNORMAL HIGH (ref 0.0–2.0)
Bicarbonate: 28.1 mmol/L — ABNORMAL HIGH (ref 20.0–28.0)
Calcium, Ion: 1.1 mmol/L — ABNORMAL LOW (ref 1.15–1.40)
HCT: 28 % — ABNORMAL LOW (ref 39.0–52.0)
Hemoglobin: 9.5 g/dL — ABNORMAL LOW (ref 13.0–17.0)
O2 Saturation: 97 %
Patient temperature: 98
Potassium: 4.5 mmol/L (ref 3.5–5.1)
Sodium: 133 mmol/L — ABNORMAL LOW (ref 135–145)
TCO2: 29 mmol/L (ref 22–32)
pCO2 arterial: 41.3 mmHg (ref 32.0–48.0)
pH, Arterial: 7.44 (ref 7.350–7.450)
pO2, Arterial: 83 mmHg (ref 83.0–108.0)

## 2018-08-11 LAB — POCT I-STAT EG7
Acid-Base Excess: 1 mmol/L (ref 0.0–2.0)
Bicarbonate: 28.6 mmol/L — ABNORMAL HIGH (ref 20.0–28.0)
Calcium, Ion: 0.4 mmol/L — CL (ref 1.15–1.40)
HCT: 34 % — ABNORMAL LOW (ref 39.0–52.0)
Hemoglobin: 11.6 g/dL — ABNORMAL LOW (ref 13.0–17.0)
O2 Saturation: 57 %
Patient temperature: 98.3
Potassium: 4.1 mmol/L (ref 3.5–5.1)
Sodium: 137 mmol/L (ref 135–145)
TCO2: 30 mmol/L (ref 22–32)
pCO2, Ven: 55.8 mmHg (ref 44.0–60.0)
pH, Ven: 7.316 (ref 7.250–7.430)
pO2, Ven: 33 mmHg (ref 32.0–45.0)

## 2018-08-11 LAB — GLUCOSE, CAPILLARY
Glucose-Capillary: 108 mg/dL — ABNORMAL HIGH (ref 70–99)
Glucose-Capillary: 139 mg/dL — ABNORMAL HIGH (ref 70–99)
Glucose-Capillary: 144 mg/dL — ABNORMAL HIGH (ref 70–99)
Glucose-Capillary: 152 mg/dL — ABNORMAL HIGH (ref 70–99)
Glucose-Capillary: 154 mg/dL — ABNORMAL HIGH (ref 70–99)
Glucose-Capillary: 161 mg/dL — ABNORMAL HIGH (ref 70–99)
Glucose-Capillary: 176 mg/dL — ABNORMAL HIGH (ref 70–99)

## 2018-08-11 LAB — APTT: aPTT: 33 seconds (ref 24–36)

## 2018-08-11 LAB — MAGNESIUM: Magnesium: 2.3 mg/dL (ref 1.7–2.4)

## 2018-08-11 LAB — CALCIUM, IONIZED: Calcium, Ionized, Serum: 4.8 mg/dL (ref 4.5–5.6)

## 2018-08-11 LAB — HEPARIN LEVEL (UNFRACTIONATED): Heparin Unfractionated: 0.13 IU/mL — ABNORMAL LOW (ref 0.30–0.70)

## 2018-08-11 MED ORDER — METOPROLOL TARTRATE 5 MG/5ML IV SOLN
2.5000 mg | Freq: Once | INTRAVENOUS | Status: AC
  Administered 2018-08-11: 2.5 mg via INTRAVENOUS

## 2018-08-11 MED ORDER — HEPARIN BOLUS VIA INFUSION
4000.0000 [IU] | Freq: Once | INTRAVENOUS | Status: AC
  Administered 2018-08-11: 15:00:00 4000 [IU] via INTRAVENOUS
  Filled 2018-08-11: qty 4000

## 2018-08-11 MED ORDER — HEPARIN SODIUM (PORCINE) 1000 UNIT/ML IJ SOLN
INTRAMUSCULAR | Status: AC
  Administered 2018-08-11: 3000 [IU]
  Filled 2018-08-11: qty 3

## 2018-08-11 MED ORDER — ALTEPLASE 2 MG IJ SOLR
INTRAMUSCULAR | Status: AC
  Administered 2018-08-11: 4 mg
  Filled 2018-08-11: qty 4

## 2018-08-11 MED ORDER — HEPARIN (PORCINE) 25000 UT/250ML-% IV SOLN
1500.0000 [IU]/h | INTRAVENOUS | Status: DC
  Administered 2018-08-11: 1500 [IU]/h via INTRAVENOUS
  Filled 2018-08-11: qty 250

## 2018-08-11 MED ORDER — HEPARIN BOLUS VIA INFUSION
2000.0000 [IU] | Freq: Once | INTRAVENOUS | Status: AC
  Administered 2018-08-11: 2000 [IU] via INTRAVENOUS
  Filled 2018-08-11: qty 2000

## 2018-08-11 MED ORDER — ALTEPLASE 2 MG IJ SOLR
2.0000 mg | Freq: Once | INTRAMUSCULAR | Status: AC
  Administered 2018-08-11: 4 mg
  Filled 2018-08-11: qty 2

## 2018-08-11 MED ORDER — ALTEPLASE 2 MG IJ SOLR
2.0000 mg | Freq: Once | INTRAMUSCULAR | Status: AC
  Administered 2018-08-11: 13:00:00 2 mg

## 2018-08-11 MED ORDER — METOPROLOL TARTRATE 5 MG/5ML IV SOLN
INTRAVENOUS | Status: AC
  Filled 2018-08-11: qty 5

## 2018-08-11 MED ORDER — HEPARIN (PORCINE) 25000 UT/250ML-% IV SOLN
1700.0000 [IU]/h | INTRAVENOUS | Status: DC
  Filled 2018-08-11 (×2): qty 250

## 2018-08-11 MED ORDER — ALTEPLASE 2 MG IJ SOLR
2.0000 mg | Freq: Once | INTRAMUSCULAR | Status: AC
  Administered 2018-08-11: 13:00:00 2 mg
  Filled 2018-08-11: qty 2

## 2018-08-11 NOTE — Progress Notes (Signed)
Excessive access pressure, unable to run crrt. Filter changed done but unable to draw blood from both access and return catheters. Iv team order placed to declot catheter. Md informed

## 2018-08-11 NOTE — Progress Notes (Signed)
CRRT restarted at 1130. At 1158 machine stopped pulling blood from red port and alarms indicated access was clotted. CRRT was stopped. Clots were noted in both the red and the blue port when disconnecting filter from access lines. TPA was instilled and remained until 1630. At 1650 CRRT was restarted with a new filter.

## 2018-08-11 NOTE — Progress Notes (Signed)
ANTICOAGULATION CONSULT NOTE - Follow Up Consult  Pharmacy Consult for IV heparin Indication: positive COVID patient on CRRT needs systemic anticoagulation, afib coverage   No Known Allergies  Patient Measurements: Height: 5\' 10"  (177.8 cm) Weight: 296 lb 4.8 oz (134.4 kg) IBW/kg (Calculated) : 73 Heparin Dosing Weight: 103.2 kg  Vital Signs: Temp: 97.9 F (36.6 C) (04/14 2320) Temp Source: Axillary (04/14 2320) BP: 139/74 (04/14 1945) Pulse Rate: 125 (04/14 2000)  Labs: Recent Labs    08/09/18 0327  08/10/18 0220  08/10/18 1617  08/10/18 2106 08/11/18 0032 08/11/18 0040 08/11/18 0407 08/11/18 0408 08/11/18 1520 08/11/18 2243  HGB 10.4*   < > 10.0*   < >  --    < > 11.6* 9.5* 11.6*  --   --   --   --   HCT 32.7*   < > 31.1*   < >  --    < > 34.0* 28.0* 34.0*  --   --   --   --   PLT 197  --  133*  --   --   --   --   --   --   --   --   --   --   APTT 31  --  28  --   --   --   --   --   --  33  --   --   --   HEPARINUNFRC  --   --   --   --   --   --   --   --   --   --   --   --  0.13*  CREATININE 5.17*   < > 4.06*  4.10*  --  4.19*  --   --   --   --   --  3.91* 4.15*  --    < > = values in this interval not displayed.    Estimated Creatinine Clearance: 28.7 mL/min (A) (by C-G formula based on SCr of 4.15 mg/dL (H)).   Medications:  Medications Prior to Admission  Medication Sig Dispense Refill Last Dose  . aspirin 81 MG tablet Take 81 mg by mouth daily.     Past Week at Unknown time  . atorvastatin (LIPITOR) 40 MG tablet Take 40 mg by mouth daily.   Past Week at Unknown time  . buPROPion (WELLBUTRIN SR) 200 MG 12 hr tablet Take 200 mg by mouth 2 (two) times daily.   Past Week at Unknown time  . co-enzyme Q-10 30 MG capsule Take 30 mg by mouth daily.   Past Week at Unknown time  . empagliflozin (JARDIANCE) 25 MG TABS tablet Take 25 mg by mouth daily.   Past Week at Unknown time  . Glucos-Chond-Hyal Ac-Ca Fructo (MOVE FREE JOINT HEALTH ADVANCE PO) Take 1 tablet  by mouth daily.   Past Week at Unknown time  . metFORMIN (GLUCOPHAGE-XR) 500 MG 24 hr tablet Take 1,000 mg by mouth daily.   Past Week at Unknown time  . metoprolol tartrate (LOPRESSOR) 25 MG tablet Take 25 mg by mouth 2 (two) times daily.   Past Week at Unknown time  . spironolactone (ALDACTONE) 25 MG tablet Take 25 mg by mouth daily.   Past Week at Unknown time    Assessment: 53 yo man to start heparin per pharmacy.  positive COVID patient on CRRT needs systemic anticoagulation per CCM request.    Today, 08/11/18  Hgb 11.6, pltc 133 ( 4/13) .  ARF on CRRT.    TBW 134, IBW 73 kg, HDW 103.2 kg.   Pt previously on systemic heparin but stopped after pt developed hematochezia. However dialysis catheter has continuously clotted off, neccessating the restart of heparin    2243 HL = 0.13 below goal, no bleeding or infusion issues per RN.    Goal of Therapy:  Heparin level 0.3-0.7 units/ml Monitor platelets by anticoagulation protocol: Yes   Plan:   Give Heparin 2000 unit rebolus followed by increasing Heparin to 1700 units/hr.  Daily CBC while on heparin  HL 8 hours after start on infusion  Monitor for signs and symptoms of bleeding  Dorrene German 08/11/2018 11:43 PM

## 2018-08-11 NOTE — Progress Notes (Addendum)
NAME:  [name] Long, MRN:  [id number], DOB:  11-Oct-1965, LOS: 50 ADMISSION DATE:  08/06/2018, CONSULTATION DATE:  07/30/2018 REFERRING MD:  07/30/2018, CHIEF COMPLAINT:   Brief History   53 yo male presented to [facility] with several days of abdominal pain from diverticulitis with retained perforation.  Developed pneumonia with hypoxia from [place].  Transferred to [facility]  Past Medical History  DM2, HTN, Former smoker, OSA, A fib  [place] [facility] Events   4/01 admission for diverticulitis 4/02 PCCM consulted for worsening dyspnea, hypoxemia 4/02 Proned  4/03 Proned  4/05 Improved oxygenation however ABG with respiratory and metabolic acidosis. Poor UOP 4/06 Supine; completed plaquenil 4/07 CVVHD, difficulty with filters clotting  4/08 CVVHD ongoing, AFwRVR afternoon of 4/8, started on amio gtt   4/12 hemodialysis catheter changed to right femoral location; brief episode of PEA resolved sponateously 08/11/2018 hemodialysis catheter and filter for CRRT machine or clotting off.  Consults:  Renal GI Surgery  Procedures:  ETT 4/2 >>  L IJ HD 4/6 >> 4/12 R Rad Aline 4/6 >>  4/12 right femoral HD catheter 24 cm>>  Significant Diagnostic Tests:  CT chest 4/01 [name]) >> b/l GGO in periphery CT abd/pelvis 4/01 [name]) >> perforated diverticulum lower descending colon, fatty liver with ?cirrhosis, splenomegaly 18.4 cm  Micro Data:  COVID 3/30 (PCP office) >> DETECTED Blood 4/12 >>  Antimicrobials:  Zosyn 4/1 >> 4/7 Zoysn 4/12 >>  Interim history/subjective:  Remains on full ventilatory support.  Having difficulty maintaining CRRT due to catheter and filter clotting off  Objective   Blood pressure 125/85, pulse (!) 147, temperature 98 F (36.7 C), temperature source Axillary, resp. rate 17, height 5\' 10"  (1.778 m), weight 134.4 kg, SpO2 98 %. CVP:  [24 mmHg-25 mmHg] 25 mmHg  Vent Mode: PRVC FiO2 (%):  [40 %-55 %] 40 % Set Rate:  [30 bmp] 30 bmp Vt Set:  [510 mL] 510 mL  PEEP:  [10 cmH20] 10 cmH20 Plateau Pressure:  [24 cmH20-28 cmH20] 28 cmH20   Intake/Output Summary (Last 24 hours) at 08/11/2018 1117 Last data filed at 08/11/2018 1000 Gross per 24 hour  Intake 2908.33 ml  Output 2972 ml  Net -63.67 ml   Filed Weights   08/09/18 0500 08/10/18 0329 08/11/18 0546  Weight: 131.2 kg (!) 139.4 kg 134.4 kg   Physical Exam:  General: Morbidly obese ill-appearing male HEENT: Tracheal tube in place orogastric tube is in place, orogastric tube is sent to intermittent wall suction without obvious bleed Neuro: Currently heavily sedated CV: Heart sounds are irregular PULM: Coarse rhonchi bilaterally GI: Is firm, distended, faint bowel sounds, currently in a G-tube to low wall intermittent suction Extremities: Warm with 2+ edema Skin: Nose erosion is noted  08/11/2018 no chest x-ray   Resolved Hospital Problem list   Mixed Acidosis   Assessment & Plan:   Acute hypoxic respiratory failure with ARDS from COVID 19 pneumonia. Hx of OSA. Discussion: Not candidate for tocilizumab due to diverticulitis. Plan Full vent support If he survives he may need a tracheostomy Intermittent chest x-rays Wean FiO2 as able   Perforated diverticulum. Hematochezia. Plan Continue antibiotics Not a surgical candidate Is questionable if we can restart tube feedings.  AKI from ATN. Lab Results  Component Value Date   CREATININE 3.91 (H) 08/11/2018   CREATININE 4.19 (H) 08/10/2018   CREATININE 4.10 (H) 08/10/2018   CREATININE 4.06 (H) 08/10/2018     Plan Continue CRRT per renal. Having difficulty with his filter clotting off.  And his catheter not flowing well. CRRT as able  DM type II. CBG (last 3)  Recent Labs    08/11/18 0030 08/11/18 0358 08/11/18 0816  GLUCAP 152* 154* 139*    Plan Sliding scale insulin protocol  Paroxysmal A fib (present prior to admission) Noted has some type of bradycardic event on 08/11/2018 but did not have a PEA arrest  Plan Amiodarone as needed We will discuss with pharmacy consideration for full dose low molecular weight heparin IV Lovenox to cover his atrial fibrillation plus his clotting of his filter and hemodialysis catheter  Acute metabolic encephalopathy. Plan Sedation goal of RA SS of -1 to -2   Best practice:  Diet: NPO, tube feedings on hold due to perforated diverticulum abdominal distention  DVT prophylaxis: SCDs GI prophylaxis: pantoprazole Mobility: bed rest Code Status: No CPR, defibrillation  Family Communication: Wife updated at length . Lurline Idol broached to her and she is OK with trach. Disposition: ICU  Labs    CMP Latest Ref Rng & Units 08/11/2018 08/11/2018 08/11/2018  Glucose 70 - 99 mg/dL 165(H) - -  BUN 6 - 20 mg/dL 78(H) - -  Creatinine 0.61 - 1.24 mg/dL 3.91(H) - -  Sodium 135 - 145 mmol/L 134(L) 137 133(L)  Potassium 3.5 - 5.1 mmol/L 4.6 4.1 4.5  Chloride 98 - 111 mmol/L 96(L) - -  CO2 22 - 32 mmol/L 25 - -  Calcium 8.9 - 10.3 mg/dL 8.7(L) - -  Total Protein 6.5 - 8.1 g/dL - - -  Total Bilirubin 0.3 - 1.2 mg/dL - - -  Alkaline Phos 38 - 126 U/L - - -  AST 15 - 41 U/L - - -  ALT 0 - 44 U/L - - -   CBC Latest Ref Rng & Units 08/11/2018 08/11/2018 08/10/2018  WBC 4.0 - 10.5 K/uL - - -  Hemoglobin 13.0 - 17.0 g/dL 11.6(L) 9.5(L) 11.6(L)  Hematocrit 39.0 - 52.0 % 34.0(L) 28.0(L) 34.0(L)  Platelets 150 - 400 K/uL - - -   ABG    Component Value Date/Time   PHART 7.440 08/11/2018 0032   PCO2ART 41.3 08/11/2018 0032   PO2ART 83.0 08/11/2018 0032   HCO3 28.6 (H) 08/11/2018 0040   TCO2 30 08/11/2018 0040   ACIDBASEDEF 2.0 08/10/2018 1554   O2SAT 57.0 08/11/2018 0040   CBG (last 3)  Recent Labs    08/11/18 0030 08/11/18 0358 08/11/18 0816  GLUCAP 152* 154* 139*    App CC time 38 minutes  Richardson Landry Minor ACNP [facility]anna [facility] PCCM Pager [phone] till 1 pm If no answer page [phone] 08/11/2018, 11:17 AM

## 2018-08-11 NOTE — Progress Notes (Signed)
ANTICOAGULATION CONSULT NOTE - Follow Up Consult  Pharmacy Consult for IV heparin Indication: positive COVID patient on CRRT needs systemic anticoagulation, afib coverage   No Known Allergies  Patient Measurements: Height: 5\' 10"  (177.8 cm) Weight: 296 lb 4.8 oz (134.4 kg) IBW/kg (Calculated) : 73 Heparin Dosing Weight: 103.2 kg  Vital Signs: Temp: 98 F (36.7 C) (04/14 0400) Temp Source: Axillary (04/14 0400) BP: 125/85 (04/14 0800) Pulse Rate: 147 (04/14 1000)  Labs: Recent Labs    08/09/18 0327  08/10/18 0220  08/10/18 1617  08/10/18 2106 08/11/18 0032 08/11/18 0040 08/11/18 0407 08/11/18 0408  HGB 10.4*   < > 10.0*   < >  --    < > 11.6* 9.5* 11.6*  --   --   HCT 32.7*   < > 31.1*   < >  --    < > 34.0* 28.0* 34.0*  --   --   PLT 197  --  133*  --   --   --   --   --   --   --   --   APTT 31  --  28  --   --   --   --   --   --  33  --   CREATININE 5.17*   < > 4.06*  4.10*  --  4.19*  --   --   --   --   --  3.91*   < > = values in this interval not displayed.    Estimated Creatinine Clearance: 30.5 mL/min (A) (by C-G formula based on SCr of 3.91 mg/dL (H)).   Medications:  Medications Prior to Admission  Medication Sig Dispense Refill Last Dose  . aspirin 81 MG tablet Take 81 mg by mouth daily.     Past Week at Unknown time  . atorvastatin (LIPITOR) 40 MG tablet Take 40 mg by mouth daily.   Past Week at Unknown time  . buPROPion (WELLBUTRIN SR) 200 MG 12 hr tablet Take 200 mg by mouth 2 (two) times daily.   Past Week at Unknown time  . co-enzyme Q-10 30 MG capsule Take 30 mg by mouth daily.   Past Week at Unknown time  . empagliflozin (JARDIANCE) 25 MG TABS tablet Take 25 mg by mouth daily.   Past Week at Unknown time  . Glucos-Chond-Hyal Ac-Ca Fructo (MOVE FREE JOINT HEALTH ADVANCE PO) Take 1 tablet by mouth daily.   Past Week at Unknown time  . metFORMIN (GLUCOPHAGE-XR) 500 MG 24 hr tablet Take 1,000 mg by mouth daily.   Past Week at Unknown time  .  metoprolol tartrate (LOPRESSOR) 25 MG tablet Take 25 mg by mouth 2 (two) times daily.   Past Week at Unknown time  . spironolactone (ALDACTONE) 25 MG tablet Take 25 mg by mouth daily.   Past Week at Unknown time    Assessment: 53 yo man to start heparin per pharmacy.  positive COVID patient on CRRT needs systemic anticoagulation per CCM request.    Today, 08/11/18  Hgb 11.6, pltc 133 ( 4/13) .   ARF on CRRT.    TBW 134, IBW 73 kg, HDW 103.2 kg.   Pt previously on systemic heparin but stopped after pt developed hematochezia. However dialysis catheter has continuously clotted off, neccessating the restart of heparin     Goal of Therapy:  Heparin level 0.3-0.7 units/ml Monitor platelets by anticoagulation protocol: Yes   Plan:   Heparin 4000 unit bolus followed by  Heparin 1500 units/hr.  Daily CBC while on heparin  HL 8 hours after start on infusion  Monitor for signs and symptoms of bleeding     Royetta Asal, PharmD, BCPS Pager [phone] 08/11/2018 12:03 PM

## 2018-08-11 NOTE — Progress Notes (Signed)
[facility] Progress Note Patient Name: [name] Long DOB: June 23, 1965 MRN: [id number]   Date of Service  08/11/2018  HPI/Events of Note  HR 130's. Discussed with bed side RN. Camera eval done. On 10 PEEP. CRRT getting clogged, on hold waiting for tPA from Nephrology team. Notes, albs seen.  eICU Interventions  Lopressor 2.5 mg IV No fever.      Intervention Category Intermediate Interventions: Arrhythmia - evaluation and management  Elmer Sow 08/11/2018, 5:10 AM

## 2018-08-11 NOTE — Progress Notes (Signed)
[facility] ROUNDING NOTE   Subjective:   This is a 53 year old gentleman diabetes hypertension obstructive sleep apnea on CPAP.  Admitted with  COVID-19.  He has been treated by CCM with Plaquenil azithromycin.  CRRT was initiated 08/02/2018.  Has had some clotting of the filter and required initiation of heparin with dialysis 08/03/2018. Then still clotting was changed to citrate anticoagulation.  He has been kept even.  Has been placed back on pressors.  Still not making great progress. Net I/O since 4/5 start of CRRT is - 2.9 L.    Being kept even on CRRT, yesterday I/O net was -469cc.  Temp femoral HD cath is being TPA'd for inability to pull.  Also staff if noting very dark blood in the dialyzer w/ visible micro-clot formation.  Remains on citrate protocol, 1 clot in last 24 hrs, not including clotted HD cath.    Chest x-ray unchanged patchy opacities.  Wt's stable 130- 135 kg.  Remains on neo gtt at 90 ug/min.    [facility] abd [facility] on 4/1 >>  R kidney visualized, no hydro noted, probably there is dye in the renal pelvis   Objective:  Vital signs in last 24 hours:  Temp:  [97.7 F (36.5 C)-100 F (37.8 C)] 98 F (36.7 C) (04/14 0400) Pulse Rate:  [91-147] 147 (04/14 1000) Resp:  [15-38] 17 (04/14 1000) BP: (125-155)/(60-85) 125/85 (04/14 0800) SpO2:  [92 %-100 %] 98 % (04/14 1000) Arterial Line BP: (103-146)/(47-94) 112/59 (04/14 1000) FiO2 (%):  [40 %-55 %] 40 % (04/14 0829) Weight:  [134.4 kg] 134.4 kg (04/14 0546)  Weight change: -5 kg Filed Weights   08/09/18 0500 08/10/18 0329 08/11/18 0546  Weight: 131.2 kg (!) 139.4 kg 134.4 kg    Intake/Output: I/O last 3 completed shifts: In: 5516.9 [P.O.:90; I.V.:4400; Other:60; NG/GT:180; IV Piggyback:786.9] Out: 4166 [Emesis/NG output:1215; AYTKZ:6010]   Intake/Output this shift:  Total I/O In: 387.6 [I.V.:322.6; NG/GT:65] Out: -   Patient not physically examined in room due to patient being + for covid 19. Exam  findings derived from RN, staff assessments and primary team assessments.   Assessment/ Plan:   Acute renal failure: Suspected ATN possibly contrast related.  Worsening respiratory acidosis and metabolic acidosis >> CRRT initiated 08/02/18. D#10 of CRRT today. Fem HD cath clotted today and is getting TPA rx this am. Some COVID+ patients have been reported to have issues w/ hypercoagulability.  Is getting citrate local anticoag for the dialyzer primarily.  If catheter continues to clot, only other option I can think of would be systemic anticoagulation.   Hypokalemia: resolved  Pneumonia/ COVID-19/ VDRF:  on vent with high support.   Diabetes mellitus per primary team  Obesity and obstructive sleep apnea  History of atrial fibrillation: on IV amiodarone  Electrolytes appear to be stable  Acid-base: resp / metabolic acidodis , controlled at this time  Volume: being kept even at this present time. Recent attempts to pull fluid have not worked, keeping even w/ CRRT.      LOS: 12 Sandy Salaam Hattye Siegfried '@TODAY' '@8' :03 AM  Basic Metabolic Panel: Recent Labs  Lab 08/07/18 0346  08/08/18 0420  08/09/18 0327  08/09/18 1600  08/10/18 0220  08/10/18 1617  08/10/18 2008 08/10/18 2106 08/11/18 0032 08/11/18 0040 08/11/18 0407 08/11/18 0408  NA 136   < > 137   < > 138   < > 137   < > 133*  134*   < > 132*   < >  137 137 133* 137  --  134*  K 4.1   < > 3.2*   < > 4.3   < > 4.1   < > 3.9  3.9   < > 4.3   < > 4.1 4.0 4.5 4.1  --  4.6  CL 101   < > 107   < > 103  --  102  --  99  99  --  97*  --   --   --   --   --   --  96*  CO2 20*   < > 24   < > 20*  --  21*  --  21*  20*  --  22  --   --   --   --   --   --  25  GLUCOSE 151*   < > 130*   < > 164*  --  171*  --  165*  164*  --  137*  --   --   --   --   --   --  165*  BUN 62*   < > 68*   < > 98*  --  98*  --  83*  84*  --  83*  --   --   --   --   --   --  78*  CREATININE 3.48*   < > 3.84*   < > 5.17*  --  4.95*  --  4.06*  4.10*  --   4.19*  --   --   --   --   --   --  3.91*  CALCIUM 8.4*   < > 7.1*   < > 8.1*  --  8.6*  --  8.7*  8.6*  --  8.4*  --   --   --   --   --   --  8.7*  MG 3.0*  --  2.6*  --  2.7*  --   --   --  2.2  --   --   --   --   --   --   --  2.3  --   PHOS 3.9   < >  --    < > 6.2*  --  5.4*  --  4.6  4.7*  --  4.8*  --   --   --   --   --   --  5.0*   < > = values in this interval not displayed.    Liver Function Tests: Recent Labs  Lab 08/05/18 0500  08/08/18 0420  08/09/18 0327 08/09/18 1600 08/10/18 0220 08/10/18 1617 08/11/18 0408  AST 114*  --  61*  --   --   --   --   --   --   ALT 53*  --  34  --   --   --   --   --   --   ALKPHOS 66  --  82  --   --   --   --   --   --   BILITOT 5.4*  --  2.0*  --   --   --   --   --   --   PROT 6.3*  --  5.5*  --   --   --   --   --   --   ALBUMIN 2.4*   < > 2.0*   < > 2.0* 2.2* 1.9* 1.9* 2.2*   < > =  values in this interval not displayed.   No results for input(s): LIPASE, AMYLASE in the last 168 hours. No results for input(s): AMMONIA in the last 168 hours.  CBC: Recent Labs  Lab 08/07/18 0346 08/08/18 0420 08/08/18 1103  08/09/18 0327  08/10/18 0220  08/10/18 2004 08/10/18 2008 08/10/18 2106 08/11/18 0032 08/11/18 0040  WBC 10.9* 11.2* 11.1*  --  7.8  --  6.0  --   --   --   --   --   --   NEUTROABS  --   --   --   --   --   --  4.9  --   --   --   --   --   --   HGB 12.3* 9.8* 10.8*   < > 10.4*   < > 10.0*   < > 8.5* 11.6* 11.6* 9.5* 11.6*  HCT 38.9* 32.2* 33.1*   < > 32.7*   < > 31.1*   < > 25.0* 34.0* 34.0* 28.0* 34.0*  MCV 92.8 93.9 95.4  --  94.5  --  93.7  --   --   --   --   --   --   PLT 212 169 190  --  197  --  133*  --   --   --   --   --   --    < > = values in this interval not displayed.    Cardiac Enzymes: No results for input(s): CKTOTAL, CKMB, CKMBINDEX, TROPONINI in the last 168 hours.  BNP: Invalid input(s): POCBNP  CBG: Recent Labs  Lab 08/10/18 1554 08/10/18 2002 08/11/18 0030 08/11/18 0358  08/11/18 0816  GLUCAP 122* 139* 152* 154* 139*    Microbiology: Results for orders placed or performed during the hospital encounter of 08/23/2018  MRSA PCR Screening     Status: None   Collection Time: 07/30/18  2:30 AM  Result Value Ref Range Status   MRSA by PCR NEGATIVE NEGATIVE Final    Comment:        The GeneXpert MRSA Assay (FDA approved for NASAL specimens only), is one component of a comprehensive MRSA colonization surveillance program. It is not intended to diagnose MRSA infection nor to guide or monitor treatment for MRSA infections. Performed at [facility], [place] [address] [place], [place] [address]   Culture, blood (routine x 2)     Status: None (Preliminary result)   Collection Time: 08/09/18  3:46 PM  Result Value Ref Range Status   Specimen Description   Final    BLOOD RIGHT HAND Performed at [facility] [address], [place], [place] [address]    Special Requests   Final    BOTTLES DRAWN AEROBIC ONLY Blood Culture adequate volume Performed at [facility] [address] [place], [place] [address]    Culture   Final    NO GROWTH < 24 HOURS Performed at [facility] [address], [place], [place] [address]    Report Status PENDING  Incomplete    Coagulation Studies: No results for input(s): LABPROT, INR in the last 72 hours.  Urinalysis: No results for input(s): COLORURINE, LABSPEC, PHURINE, GLUCOSEU, HGBUR, BILIRUBINUR, KETONESUR, PROTEINUR, UROBILINOGEN, NITRITE, LEUKOCYTESUR in the last 72 hours.  Invalid input(s): APPERANCEUR    Imaging: Dg Abd 1 View  Result Date: 08/10/2018 CLINICAL DATA:  Impaction of bowels. COVID-19. EXAM: ABDOMEN - 1 VIEW COMPARISON:  08/08/2018 FINDINGS: Nasogastric tube  tip overlies the gastric antrum. Right femoral catheter remains in place. The bowel gas pattern is normal. No evidence of dilated bowel loops. IMPRESSION: Normal bowel gas pattern.  Nasogastric tube in appropriate position. Electronically Signed   By: Earle Gell M.D.   On: 08/10/2018 08:34   Dg Chest Port 1 View  Result Date: 08/10/2018 CLINICAL DATA:  COVID-19. Respiratory failure. Endotracheal tube present. EXAM: PORTABLE CHEST 1 VIEW COMPARISON:  08/07/2018 FINDINGS: Endotracheal tube and nasogastric tube remain in appropriate position. Heart size is stable. Low lung volumes are again seen. Bibasilar pulmonary infiltrates show no significant change. No pleural effusion visualized. IMPRESSION: 1. Stable low lung volumes and bibasilar pulmonary infiltrates. 2. Endotracheal tube and nasogastric tube remain in appropriate position. Electronically Signed   By: Earle Gell M.D.   On: 08/10/2018 08:33     Medications:   .  prismasol BGK 4/2.5 500 mL/hr at 08/11/18 0034  . sodium chloride Stopped (08/10/18 0653)  . amiodarone 30 mg/hr (08/11/18 1000)  . anticoagulant sodium citrate    . calcium gluconate infusion for CRRT Stopped (08/11/18 0400)  . dexmedetomidine (PRECEDEX) IV infusion Stopped (08/10/18 1115)  . fentaNYL infusion INTRAVENOUS 200 mcg/hr (08/11/18 1000)  . midazolam 3 mg/hr (08/11/18 1000)  . phenylephrine (NEO-SYNEPHRINE) Adult infusion 90 mcg/min (08/11/18 1000)  . piperacillin-tazobactam (ZOSYN)  IV 2.25 g (08/11/18 0606)  . prismasol BGK 4/2.5 2,000 mL/hr at 08/11/18 0153  . sodium citrate 2 %/dextrose 2.5% solution 3000 mL 260 mL/hr at 08/10/18 2305   . chlorhexidine gluconate (MEDLINE KIT)  15 mL Mouth Rinse BID  . Chlorhexidine Gluconate Cloth  6 each Topical Daily  . feeding supplement (PRO-STAT SUGAR FREE 64)  60 mL Per Tube BID  . insulin aspart  0-9 Units Subcutaneous Q4H  . mouth rinse  15 mL Mouth Rinse 10 times per day  . metoprolol tartrate      . pantoprazole (PROTONIX) IV  40 mg Intravenous Q24H  . sodium chloride flush  10-40 mL Intracatheter Q12H   Place/Maintain arterial line **AND** sodium chloride, acetaminophen (TYLENOL) oral  liquid 160 mg/5 mL, albuterol, anticoagulant sodium citrate, fentaNYL, hydrALAZINE, lip balm, midazolam, midazolam, [DISCONTINUED] ondansetron **OR** ondansetron (ZOFRAN) IV, sodium chloride flush

## 2018-08-12 ENCOUNTER — Inpatient Hospital Stay (HOSPITAL_COMMUNITY): Payer: BLUE CROSS/BLUE SHIELD

## 2018-08-12 LAB — POCT I-STAT EG7
Acid-Base Excess: 1 mmol/L (ref 0.0–2.0)
Acid-Base Excess: 2 mmol/L (ref 0.0–2.0)
Acid-Base Excess: 2 mmol/L (ref 0.0–2.0)
Acid-Base Excess: 2 mmol/L (ref 0.0–2.0)
Acid-Base Excess: 2 mmol/L (ref 0.0–2.0)
Acid-Base Excess: 3 mmol/L — ABNORMAL HIGH (ref 0.0–2.0)
Acid-Base Excess: 3 mmol/L — ABNORMAL HIGH (ref 0.0–2.0)
Acid-Base Excess: 3 mmol/L — ABNORMAL HIGH (ref 0.0–2.0)
Acid-Base Excess: 4 mmol/L — ABNORMAL HIGH (ref 0.0–2.0)
Acid-Base Excess: 4 mmol/L — ABNORMAL HIGH (ref 0.0–2.0)
Bicarbonate: 27.4 mmol/L (ref 20.0–28.0)
Bicarbonate: 28.2 mmol/L — ABNORMAL HIGH (ref 20.0–28.0)
Bicarbonate: 28.7 mmol/L — ABNORMAL HIGH (ref 20.0–28.0)
Bicarbonate: 28.9 mmol/L — ABNORMAL HIGH (ref 20.0–28.0)
Bicarbonate: 29.3 mmol/L — ABNORMAL HIGH (ref 20.0–28.0)
Bicarbonate: 29.3 mmol/L — ABNORMAL HIGH (ref 20.0–28.0)
Bicarbonate: 29.9 mmol/L — ABNORMAL HIGH (ref 20.0–28.0)
Bicarbonate: 30.3 mmol/L — ABNORMAL HIGH (ref 20.0–28.0)
Bicarbonate: 30.5 mmol/L — ABNORMAL HIGH (ref 20.0–28.0)
Bicarbonate: 30.7 mmol/L — ABNORMAL HIGH (ref 20.0–28.0)
Calcium, Ion: 0.47 mmol/L — CL (ref 1.15–1.40)
Calcium, Ion: 0.53 mmol/L — CL (ref 1.15–1.40)
Calcium, Ion: 0.63 mmol/L — CL (ref 1.15–1.40)
Calcium, Ion: 0.65 mmol/L — CL (ref 1.15–1.40)
Calcium, Ion: 0.83 mmol/L — CL (ref 1.15–1.40)
Calcium, Ion: 0.4 mmol/L — CL (ref 1.15–1.40)
Calcium, Ion: 0.41 mmol/L — CL (ref 1.15–1.40)
Calcium, Ion: 0.45 mmol/L — CL (ref 1.15–1.40)
Calcium, Ion: 0.47 mmol/L — CL (ref 1.15–1.40)
Calcium, Ion: 0.77 mmol/L — CL (ref 1.15–1.40)
HCT: 27 % — ABNORMAL LOW (ref 39.0–52.0)
HCT: 28 % — ABNORMAL LOW (ref 39.0–52.0)
HCT: 28 % — ABNORMAL LOW (ref 39.0–52.0)
HCT: 28 % — ABNORMAL LOW (ref 39.0–52.0)
HCT: 31 % — ABNORMAL LOW (ref 39.0–52.0)
HCT: 26 % — ABNORMAL LOW (ref 39.0–52.0)
HCT: 28 % — ABNORMAL LOW (ref 39.0–52.0)
HCT: 29 % — ABNORMAL LOW (ref 39.0–52.0)
HCT: 30 % — ABNORMAL LOW (ref 39.0–52.0)
HCT: 31 % — ABNORMAL LOW (ref 39.0–52.0)
Hemoglobin: 10.5 g/dL — ABNORMAL LOW (ref 13.0–17.0)
Hemoglobin: 9.2 g/dL — ABNORMAL LOW (ref 13.0–17.0)
Hemoglobin: 9.5 g/dL — ABNORMAL LOW (ref 13.0–17.0)
Hemoglobin: 9.5 g/dL — ABNORMAL LOW (ref 13.0–17.0)
Hemoglobin: 9.5 g/dL — ABNORMAL LOW (ref 13.0–17.0)
Hemoglobin: 10.2 g/dL — ABNORMAL LOW (ref 13.0–17.0)
Hemoglobin: 10.5 g/dL — ABNORMAL LOW (ref 13.0–17.0)
Hemoglobin: 8.8 g/dL — ABNORMAL LOW (ref 13.0–17.0)
Hemoglobin: 9.5 g/dL — ABNORMAL LOW (ref 13.0–17.0)
Hemoglobin: 9.9 g/dL — ABNORMAL LOW (ref 13.0–17.0)
O2 Saturation: 60 %
O2 Saturation: 66 %
O2 Saturation: 68 %
O2 Saturation: 68 %
O2 Saturation: 72 %
O2 Saturation: 60 %
O2 Saturation: 60 %
O2 Saturation: 64 %
O2 Saturation: 64 %
O2 Saturation: 66 %
Patient temperature: 97.1
Patient temperature: 97.3
Patient temperature: 98.2
Patient temperature: 98.6
Patient temperature: 98.8
Patient temperature: 98
Patient temperature: 98
Patient temperature: 98.1
Potassium: 4.1 mmol/L (ref 3.5–5.1)
Potassium: 4.3 mmol/L (ref 3.5–5.1)
Potassium: 4.4 mmol/L (ref 3.5–5.1)
Potassium: 4.7 mmol/L (ref 3.5–5.1)
Potassium: 4.8 mmol/L (ref 3.5–5.1)
Potassium: 4.1 mmol/L (ref 3.5–5.1)
Potassium: 4.1 mmol/L (ref 3.5–5.1)
Potassium: 4.1 mmol/L (ref 3.5–5.1)
Potassium: 4.2 mmol/L (ref 3.5–5.1)
Potassium: 4.4 mmol/L (ref 3.5–5.1)
Sodium: 135 mmol/L (ref 135–145)
Sodium: 135 mmol/L (ref 135–145)
Sodium: 136 mmol/L (ref 135–145)
Sodium: 137 mmol/L (ref 135–145)
Sodium: 138 mmol/L (ref 135–145)
Sodium: 135 mmol/L (ref 135–145)
Sodium: 136 mmol/L (ref 135–145)
Sodium: 137 mmol/L (ref 135–145)
Sodium: 137 mmol/L (ref 135–145)
Sodium: 137 mmol/L (ref 135–145)
TCO2: 29 mmol/L (ref 22–32)
TCO2: 30 mmol/L (ref 22–32)
TCO2: 30 mmol/L (ref 22–32)
TCO2: 30 mmol/L (ref 22–32)
TCO2: 31 mmol/L (ref 22–32)
TCO2: 31 mmol/L (ref 22–32)
TCO2: 32 mmol/L (ref 22–32)
TCO2: 32 mmol/L (ref 22–32)
TCO2: 32 mmol/L (ref 22–32)
TCO2: 33 mmol/L — ABNORMAL HIGH (ref 22–32)
pCO2, Ven: 53.8 mmHg (ref 44.0–60.0)
pCO2, Ven: 54.5 mmHg (ref 44.0–60.0)
pCO2, Ven: 54.5 mmHg (ref 44.0–60.0)
pCO2, Ven: 54.7 mmHg (ref 44.0–60.0)
pCO2, Ven: 56.4 mmHg (ref 44.0–60.0)
pCO2, Ven: 51.9 mmHg (ref 44.0–60.0)
pCO2, Ven: 53.6 mmHg (ref 44.0–60.0)
pCO2, Ven: 54.5 mmHg (ref 44.0–60.0)
pCO2, Ven: 57.7 mmHg (ref 44.0–60.0)
pCO2, Ven: 61.1 mmHg — ABNORMAL HIGH (ref 44.0–60.0)
pH, Ven: 7.308 (ref 7.250–7.430)
pH, Ven: 7.321 (ref 7.250–7.430)
pH, Ven: 7.323 (ref 7.250–7.430)
pH, Ven: 7.323 (ref 7.250–7.430)
pH, Ven: 7.336 (ref 7.250–7.430)
pH, Ven: 7.309 (ref 7.250–7.430)
pH, Ven: 7.32 (ref 7.250–7.430)
pH, Ven: 7.357 (ref 7.250–7.430)
pH, Ven: 7.358 (ref 7.250–7.430)
pH, Ven: 7.359 (ref 7.250–7.430)
pO2, Ven: 35 mmHg (ref 32.0–45.0)
pO2, Ven: 37 mmHg (ref 32.0–45.0)
pO2, Ven: 38 mmHg (ref 32.0–45.0)
pO2, Ven: 38 mmHg (ref 32.0–45.0)
pO2, Ven: 40 mmHg (ref 32.0–45.0)
pO2, Ven: 33 mmHg (ref 32.0–45.0)
pO2, Ven: 34 mmHg (ref 32.0–45.0)
pO2, Ven: 35 mmHg (ref 32.0–45.0)
pO2, Ven: 36 mmHg (ref 32.0–45.0)
pO2, Ven: 37 mmHg (ref 32.0–45.0)

## 2018-08-12 LAB — APTT
aPTT: 48 seconds — ABNORMAL HIGH (ref 24–36)
aPTT: 63 seconds — ABNORMAL HIGH (ref 24–36)

## 2018-08-12 LAB — POCT I-STAT 7, (LYTES, BLD GAS, ICA,H+H)
Acid-Base Excess: 1 mmol/L (ref 0.0–2.0)
Acid-Base Excess: 2 mmol/L (ref 0.0–2.0)
Acid-Base Excess: 2 mmol/L (ref 0.0–2.0)
Acid-Base Excess: 3 mmol/L — ABNORMAL HIGH (ref 0.0–2.0)
Acid-Base Excess: 3 mmol/L — ABNORMAL HIGH (ref 0.0–2.0)
Acid-Base Excess: 4 mmol/L — ABNORMAL HIGH (ref 0.0–2.0)
Acid-Base Excess: 4 mmol/L — ABNORMAL HIGH (ref 0.0–2.0)
Acid-Base Excess: 4 mmol/L — ABNORMAL HIGH (ref 0.0–2.0)
Acid-Base Excess: 5 mmol/L — ABNORMAL HIGH (ref 0.0–2.0)
Acid-Base Excess: 5 mmol/L — ABNORMAL HIGH (ref 0.0–2.0)
Bicarbonate: 25.8 mmol/L (ref 20.0–28.0)
Bicarbonate: 26.8 mmol/L (ref 20.0–28.0)
Bicarbonate: 27.3 mmol/L (ref 20.0–28.0)
Bicarbonate: 27.4 mmol/L (ref 20.0–28.0)
Bicarbonate: 27.5 mmol/L (ref 20.0–28.0)
Bicarbonate: 28.3 mmol/L — ABNORMAL HIGH (ref 20.0–28.0)
Bicarbonate: 24.5 mmol/L (ref 20.0–28.0)
Bicarbonate: 28.7 mmol/L — ABNORMAL HIGH (ref 20.0–28.0)
Bicarbonate: 28.7 mmol/L — ABNORMAL HIGH (ref 20.0–28.0)
Bicarbonate: 30.1 mmol/L — ABNORMAL HIGH (ref 20.0–28.0)
Bicarbonate: 30.1 mmol/L — ABNORMAL HIGH (ref 20.0–28.0)
Calcium, Ion: 1.04 mmol/L — ABNORMAL LOW (ref 1.15–1.40)
Calcium, Ion: 1.07 mmol/L — ABNORMAL LOW (ref 1.15–1.40)
Calcium, Ion: 1.11 mmol/L — ABNORMAL LOW (ref 1.15–1.40)
Calcium, Ion: 1.12 mmol/L — ABNORMAL LOW (ref 1.15–1.40)
Calcium, Ion: 1.14 mmol/L — ABNORMAL LOW (ref 1.15–1.40)
Calcium, Ion: 1.14 mmol/L — ABNORMAL LOW (ref 1.15–1.40)
Calcium, Ion: 1.07 mmol/L — ABNORMAL LOW (ref 1.15–1.40)
Calcium, Ion: 1.12 mmol/L — ABNORMAL LOW (ref 1.15–1.40)
Calcium, Ion: 1.13 mmol/L — ABNORMAL LOW (ref 1.15–1.40)
Calcium, Ion: 1.14 mmol/L — ABNORMAL LOW (ref 1.15–1.40)
Calcium, Ion: 1.15 mmol/L (ref 1.15–1.40)
HCT: 22 % — ABNORMAL LOW (ref 39.0–52.0)
HCT: 22 % — ABNORMAL LOW (ref 39.0–52.0)
HCT: 23 % — ABNORMAL LOW (ref 39.0–52.0)
HCT: 23 % — ABNORMAL LOW (ref 39.0–52.0)
HCT: 25 % — ABNORMAL LOW (ref 39.0–52.0)
HCT: 25 % — ABNORMAL LOW (ref 39.0–52.0)
HCT: 20 % — ABNORMAL LOW (ref 39.0–52.0)
HCT: 22 % — ABNORMAL LOW (ref 39.0–52.0)
HCT: 23 % — ABNORMAL LOW (ref 39.0–52.0)
HCT: 24 % — ABNORMAL LOW (ref 39.0–52.0)
HCT: 25 % — ABNORMAL LOW (ref 39.0–52.0)
Hemoglobin: 7.5 g/dL — ABNORMAL LOW (ref 13.0–17.0)
Hemoglobin: 7.5 g/dL — ABNORMAL LOW (ref 13.0–17.0)
Hemoglobin: 7.8 g/dL — ABNORMAL LOW (ref 13.0–17.0)
Hemoglobin: 7.8 g/dL — ABNORMAL LOW (ref 13.0–17.0)
Hemoglobin: 8.5 g/dL — ABNORMAL LOW (ref 13.0–17.0)
Hemoglobin: 8.5 g/dL — ABNORMAL LOW (ref 13.0–17.0)
Hemoglobin: 6.8 g/dL — CL (ref 13.0–17.0)
Hemoglobin: 7.5 g/dL — ABNORMAL LOW (ref 13.0–17.0)
Hemoglobin: 7.8 g/dL — ABNORMAL LOW (ref 13.0–17.0)
Hemoglobin: 8.2 g/dL — ABNORMAL LOW (ref 13.0–17.0)
Hemoglobin: 8.5 g/dL — ABNORMAL LOW (ref 13.0–17.0)
O2 Saturation: 97 %
O2 Saturation: 98 %
O2 Saturation: 99 %
O2 Saturation: 99 %
O2 Saturation: 99 %
O2 Saturation: 99 %
O2 Saturation: 94 %
O2 Saturation: 95 %
O2 Saturation: 99 %
O2 Saturation: 99 %
O2 Saturation: 99 %
Patient temperature: 97.1
Patient temperature: 97.3
Patient temperature: 97.9
Patient temperature: 98.2
Patient temperature: 98.6
Patient temperature: 98
Patient temperature: 98.1
Patient temperature: 98.1
Potassium: 4.7 mmol/L (ref 3.5–5.1)
Potassium: 4.8 mmol/L (ref 3.5–5.1)
Potassium: 4.8 mmol/L (ref 3.5–5.1)
Potassium: 5 mmol/L (ref 3.5–5.1)
Potassium: 5.1 mmol/L (ref 3.5–5.1)
Potassium: 5.3 mmol/L — ABNORMAL HIGH (ref 3.5–5.1)
Potassium: 4 mmol/L (ref 3.5–5.1)
Potassium: 4.2 mmol/L (ref 3.5–5.1)
Potassium: 4.5 mmol/L (ref 3.5–5.1)
Potassium: 4.7 mmol/L (ref 3.5–5.1)
Potassium: 4.7 mmol/L (ref 3.5–5.1)
Sodium: 133 mmol/L — ABNORMAL LOW (ref 135–145)
Sodium: 134 mmol/L — ABNORMAL LOW (ref 135–145)
Sodium: 134 mmol/L — ABNORMAL LOW (ref 135–145)
Sodium: 134 mmol/L — ABNORMAL LOW (ref 135–145)
Sodium: 134 mmol/L — ABNORMAL LOW (ref 135–145)
Sodium: 134 mmol/L — ABNORMAL LOW (ref 135–145)
Sodium: 134 mmol/L — ABNORMAL LOW (ref 135–145)
Sodium: 134 mmol/L — ABNORMAL LOW (ref 135–145)
Sodium: 134 mmol/L — ABNORMAL LOW (ref 135–145)
Sodium: 136 mmol/L (ref 135–145)
Sodium: 137 mmol/L (ref 135–145)
TCO2: 27 mmol/L (ref 22–32)
TCO2: 28 mmol/L (ref 22–32)
TCO2: 29 mmol/L (ref 22–32)
TCO2: 29 mmol/L (ref 22–32)
TCO2: 29 mmol/L (ref 22–32)
TCO2: 30 mmol/L (ref 22–32)
TCO2: 26 mmol/L (ref 22–32)
TCO2: 30 mmol/L (ref 22–32)
TCO2: 30 mmol/L (ref 22–32)
TCO2: 31 mmol/L (ref 22–32)
TCO2: 31 mmol/L (ref 22–32)
pCO2 arterial: 40.4 mmHg (ref 32.0–48.0)
pCO2 arterial: 41.6 mmHg (ref 32.0–48.0)
pCO2 arterial: 42 mmHg (ref 32.0–48.0)
pCO2 arterial: 42.6 mmHg (ref 32.0–48.0)
pCO2 arterial: 43.5 mmHg (ref 32.0–48.0)
pCO2 arterial: 43.6 mmHg (ref 32.0–48.0)
pCO2 arterial: 37.1 mmHg (ref 32.0–48.0)
pCO2 arterial: 40.6 mmHg (ref 32.0–48.0)
pCO2 arterial: 44.3 mmHg (ref 32.0–48.0)
pCO2 arterial: 44.4 mmHg (ref 32.0–48.0)
pCO2 arterial: 45 mmHg (ref 32.0–48.0)
pH, Arterial: 7.381 (ref 7.350–7.450)
pH, Arterial: 7.404 (ref 7.350–7.450)
pH, Arterial: 7.406 (ref 7.350–7.450)
pH, Arterial: 7.423 (ref 7.350–7.450)
pH, Arterial: 7.437 (ref 7.350–7.450)
pH, Arterial: 7.437 (ref 7.350–7.450)
pH, Arterial: 7.419 (ref 7.350–7.450)
pH, Arterial: 7.427 (ref 7.350–7.450)
pH, Arterial: 7.432 (ref 7.350–7.450)
pH, Arterial: 7.439 (ref 7.350–7.450)
pH, Arterial: 7.455 — ABNORMAL HIGH (ref 7.350–7.450)
pO2, Arterial: 112 mmHg — ABNORMAL HIGH (ref 83.0–108.0)
pO2, Arterial: 113 mmHg — ABNORMAL HIGH (ref 83.0–108.0)
pO2, Arterial: 128 mmHg — ABNORMAL HIGH (ref 83.0–108.0)
pO2, Arterial: 128 mmHg — ABNORMAL HIGH (ref 83.0–108.0)
pO2, Arterial: 144 mmHg — ABNORMAL HIGH (ref 83.0–108.0)
pO2, Arterial: 87 mmHg (ref 83.0–108.0)
pO2, Arterial: 125 mmHg — ABNORMAL HIGH (ref 83.0–108.0)
pO2, Arterial: 129 mmHg — ABNORMAL HIGH (ref 83.0–108.0)
pO2, Arterial: 136 mmHg — ABNORMAL HIGH (ref 83.0–108.0)
pO2, Arterial: 68 mmHg — ABNORMAL LOW (ref 83.0–108.0)
pO2, Arterial: 75 mmHg — ABNORMAL LOW (ref 83.0–108.0)

## 2018-08-12 LAB — HEPARIN LEVEL (UNFRACTIONATED)
Heparin Unfractionated: <0.1 IU/mL — ABNORMAL LOW (ref 0.30–0.70)
Heparin Unfractionated: 0.11 IU/mL — ABNORMAL LOW (ref 0.30–0.70)

## 2018-08-12 LAB — RENAL FUNCTION PANEL
Albumin: 2 g/dL — ABNORMAL LOW (ref 3.5–5.0)
Anion gap: 12 (ref 5–15)
BUN: 60 mg/dL — ABNORMAL HIGH (ref 6–20)
CO2: 27 mmol/L (ref 22–32)
Calcium: 8.7 mg/dL — ABNORMAL LOW (ref 8.9–10.3)
Chloride: 97 mmol/L — ABNORMAL LOW (ref 98–111)
Creatinine, Ser: 3.83 mg/dL — ABNORMAL HIGH (ref 0.61–1.24)
GFR calc Af Amer: 20 mL/min — ABNORMAL LOW (ref >60)
GFR calc non Af Amer: 17 mL/min — ABNORMAL LOW (ref >60)
Glucose, Bld: 173 mg/dL — ABNORMAL HIGH (ref 70–99)
Phosphorus: 4.5 mg/dL (ref 2.5–4.6)
Potassium: 4.5 mmol/L (ref 3.5–5.1)
Sodium: 136 mmol/L (ref 135–145)

## 2018-08-12 LAB — BASIC METABOLIC PANEL
Anion gap: 13 (ref 5–15)
BUN: 77 mg/dL — ABNORMAL HIGH (ref 6–20)
CO2: 25 mmol/L (ref 22–32)
Calcium: 8.6 mg/dL — ABNORMAL LOW (ref 8.9–10.3)
Chloride: 96 mmol/L — ABNORMAL LOW (ref 98–111)
Creatinine, Ser: 4.74 mg/dL — ABNORMAL HIGH (ref 0.61–1.24)
GFR calc Af Amer: 15 mL/min — ABNORMAL LOW (ref >60)
GFR calc non Af Amer: 13 mL/min — ABNORMAL LOW (ref >60)
Glucose, Bld: 134 mg/dL — ABNORMAL HIGH (ref 70–99)
Potassium: 4.8 mmol/L (ref 3.5–5.1)
Sodium: 134 mmol/L — ABNORMAL LOW (ref 135–145)

## 2018-08-12 LAB — GLUCOSE, CAPILLARY
Glucose-Capillary: 130 mg/dL — ABNORMAL HIGH (ref 70–99)
Glucose-Capillary: 148 mg/dL — ABNORMAL HIGH (ref 70–99)
Glucose-Capillary: 152 mg/dL — ABNORMAL HIGH (ref 70–99)
Glucose-Capillary: 154 mg/dL — ABNORMAL HIGH (ref 70–99)
Glucose-Capillary: 163 mg/dL — ABNORMAL HIGH (ref 70–99)
Glucose-Capillary: 172 mg/dL — ABNORMAL HIGH (ref 70–99)

## 2018-08-12 LAB — CBC
HCT: 25.6 % — ABNORMAL LOW (ref 39.0–52.0)
Hemoglobin: 8.1 g/dL — ABNORMAL LOW (ref 13.0–17.0)
MCH: 29 pg (ref 26.0–34.0)
MCHC: 31.6 g/dL (ref 30.0–36.0)
MCV: 91.8 fL (ref 80.0–100.0)
Platelets: 122 10*3/uL — ABNORMAL LOW (ref 150–400)
RBC: 2.79 MIL/uL — ABNORMAL LOW (ref 4.22–5.81)
RDW: 19.2 % — ABNORMAL HIGH (ref 11.5–15.5)
WBC: 3.1 10*3/uL — ABNORMAL LOW (ref 4.0–10.5)
nRBC: 1.6 % — ABNORMAL HIGH (ref 0.0–0.2)

## 2018-08-12 LAB — PHOSPHORUS: Phosphorus: 5.8 mg/dL — ABNORMAL HIGH (ref 2.5–4.6)

## 2018-08-12 LAB — CALCIUM, IONIZED: Calcium, Ionized, Serum: 4.9 mg/dL (ref 4.5–5.6)

## 2018-08-12 LAB — MAGNESIUM: Magnesium: 2.4 mg/dL (ref 1.7–2.4)

## 2018-08-12 MED ORDER — PRO-STAT SUGAR FREE PO LIQD
60.0000 mL | Freq: Three times a day (TID) | ORAL | Status: DC
  Administered 2018-08-12 – 2018-08-17 (×17): 60 mL
  Filled 2018-08-12 (×17): qty 60

## 2018-08-12 MED ORDER — VITAL HIGH PROTEIN PO LIQD
1000.0000 mL | ORAL | Status: DC
  Administered 2018-08-12 – 2018-08-13 (×2): 1000 mL

## 2018-08-12 MED ORDER — HEPARIN (PORCINE) 25000 UT/250ML-% IV SOLN
2300.0000 [IU]/h | INTRAVENOUS | Status: DC
  Administered 2018-08-12: 15:00:00 2000 [IU]/h via INTRAVENOUS
  Administered 2018-08-13 (×2): 2300 [IU]/h via INTRAVENOUS
  Filled 2018-08-12: qty 250

## 2018-08-12 MED ORDER — CHLORHEXIDINE GLUCONATE CLOTH 2 % EX PADS
6.0000 | MEDICATED_PAD | Freq: Every day | CUTANEOUS | Status: DC
  Administered 2018-08-13 – 2018-08-22 (×10): 6 via TOPICAL

## 2018-08-12 NOTE — Progress Notes (Signed)
PROGRESS NOTE    [name] Long  MBT:[id number] DOB: 04-03-1966 DOA: 08/23/2018 PCP: Patient, No Pcp Per    Brief Narrative:  53 year old gentleman with a history of type 2 diabetes, hypertension, obstructive sleep apnea on CPAP who remains in the hospital for last 2 weeks.  He was initially admitted to [facility] emergency room with many days of abdominal pain and recurrent fevers.  He had acute sigmoid diverticulitis with localized peritonitis and perforation along with COVID-19 pneumonia.  Initially he was noted to have mild hypoxemia on admission, he subsequently deteriorated needing mechanical intubation and ventilation.  He has been suffering from multiple problems since then.  Remains critically ill.   Assessment & Plan:   Principal Problem:   Acute respiratory distress syndrome (ARDS) due to COVID-19 virus Active Problems:   COVID-19 virus infection   Diverticulitis of colon with perforation   HTN (hypertension)   DM2 (diabetes mellitus, type 2) (HCC)   Neutropenia (HCC)   Thrombocytopenia (HCC)   Pressure injury of skin  Acute respiratory distress syndrome due to COVID-19 infection: Patient remains on mechanical ventilation with difficult to wean from the ventilator.  Likely needing tracheostomy for ongoing ventilator support because of underlying morbid obesity and sleep apnea.  Followed by critical care.  Continue current mechanical ventilation as per critical care. He was not a candidate for Tocilizumab because of acute diverticulitis.  Acute kidney injury: Followed by nephrology.  Currently remains on CRRT.  Perforated sigmoid diverticulum: Localized diverticulum.  Patient was followed by GI and surgery.  Signed off.  Patient does have bowel function.  Trying trickle feeding today to challenge his bowel.  Patient remains on Zosyn.  paroxysmal atrial fibrillation: Heart rate control remains an issue.  Patient is on amiodarone drip.  He is also on heparin drip.  Type 2  diabetes: Remains on insulin.  DVT prophylaxis: Patient on heparin drip. Code Status: No CPR.  Okay for defibrillation. Family Communication: Critical care communicated with wife. Disposition Plan: ICU.   Consultants:   PCCM  Surgery, signed off  GI , signed off  Renal , CRRT  Procedures:  ETT 4/2 >>  L IJ HD 4/6 >> 4/12 R Rad Aline 4/6 >>  4/12 right femoral HD catheter 24 cm>>  Antimicrobials:   Zosyn 4/1-4/7  Zosyn 4/12--- ongoing    Subjective: Patient examined along with critical care team.  He is sedated.  No overnight events.  Heart rate control remains a problem.  Objective: Vitals:   08/12/18 0900 08/12/18 1000 08/12/18 1100 08/12/18 1130  BP:      Pulse: (!) 137 (!) 145 (!) 140 (!) 145  Resp: (!) _0 Temp:    98.1 F (36.7 C)  TempSrc:    Axillary  SpO2: 100% 98% 99% 98%  Weight:      Height:        Intake/Output Summary (Last 24 hours) at 08/12/2018 1143 Last data filed at 08/12/2018 1100 Gross per 24 hour  Intake 2670.39 ml  Output 3868 ml  Net -1197.61 ml   Filed Weights   08/10/18 0329 08/11/18 0546 08/12/18 0500  Weight: (!) 139.4 kg 134.4 kg (!) 136.7 kg    Examination:  Examination as per critical care provider:  General: Morbidly obese male who is currently heavily sedated on full mechanical ventilatory support HEENT: Pupils equal reactive and pinpoint.  He does have some abrasions around his nares.  No JVD or lymphadenopathy is appreciated Neuro: Opens eyes to loud verbal  stimulus CV: Heart sounds irregular irregular with poorly controlled ventricular rate of 142 in the setting of atrial fibrillation PULM: Coarse rhonchi bilaterally, decreased breath sounds in the bases GI: Abdomen is less distended, OG tube is currently to intermittent wall suction with approximately 50 cc an hour of bile drainage. Extremities: Warm and dry, 2+ edema, Skin: Warm to touch    Data Reviewed: I have personally reviewed following labs  and imaging studies  CBC: Recent Labs  Lab 08/08/18 0420 08/08/18 1103  08/09/18 0327  08/10/18 0220  08/12/18 0146 08/12/18 0159 08/12/18 0330 08/12/18 0536 08/12/18 0541  WBC 11.2* 11.1*  --  7.8  --  6.0  --   --   --  3.1*  --   --   NEUTROABS  --   --   --   --   --  4.9  --   --   --   --   --   --   HGB 9.8* 10.8*   < > 10.4*   < > 10.0*   < > 10.5* 7.8* 8.1* 9.5* 7.8*  HCT 32.2* 33.1*   < > 32.7*   < > 31.1*   < > 31.0* 23.0* 25.6* 28.0* 23.0*  MCV 93.9 95.4  --  94.5  --  93.7  --   --   --  91.8  --   --   PLT 169 190  --  197  --  133*  --   --   --  122*  --   --    < > = values in this interval not displayed.   Basic Metabolic Panel: Recent Labs  Lab 08/08/18 0420  08/09/18 0327  08/10/18 0220  08/10/18 1617  08/11/18 0407 08/11/18 0408 08/11/18 1520  08/12/18 0146 08/12/18 0159 08/12/18 0330 08/12/18 0536 08/12/18 0541  NA 137   < > 138   < > 133*  134*   < > 132*   < >  --  134* 138   < > 138 134* 134* 137 133*  K 3.2*   < > 4.3   < > 3.9  3.9   < > 4.3   < >  --  4.6 4.4   < > 4.1 4.8 4.8 4.3 4.7  CL 107   < > 103   < > 99  99  --  97*  --   --  96* 107  --   --   --  96*  --   --   CO2 24   < > 20*   < > 21*  20*  --  22  --   --  25 21*  --   --   --  25  --   --   GLUCOSE 130*   < > 164*   < > 165*  164*  --  137*  --   --  165* 127*  --   --   --  134*  --   --   BUN 68*   < > 98*   < > 83*  84*  --  83*  --   --  78* 80*  --   --   --  77*  --   --   CREATININE 3.84*   < > 5.17*   < > 4.06*  4.10*  --  4.19*  --   --  3.91* 4.15*  --   --   --  4.74*  --   --   CALCIUM 7.1*   < > 8.1*   < > 8.7*  8.6*  --  8.4*  --   --  8.7* 6.8*  --   --   --  8.6*  --   --   MG 2.6*  --  2.7*  --  2.2  --   --   --  2.3  --   --   --   --   --  2.4  --   --   PHOS  --    < > 6.2*   < > 4.6  4.7*  --  4.8*  --   --  5.0* 5.7*  --   --   --  5.8*  --   --    < > = values in this interval not displayed.   GFR: Estimated Creatinine Clearance: 25.4 mL/min  (A) (by C-G formula based on SCr of 4.74 mg/dL (H)). Liver Function Tests: Recent Labs  Lab 08/08/18 0420  08/09/18 1600 08/10/18 0220 08/10/18 1617 08/11/18 0408 08/11/18 1520  AST 61*  --   --   --   --   --   --   ALT 34  --   --   --   --   --   --   ALKPHOS 82  --   --   --   --   --   --   BILITOT 2.0*  --   --   --   --   --   --   PROT 5.5*  --   --   --   --   --   --   ALBUMIN 2.0*   < > 2.2* 1.9* 1.9* 2.2* 1.7*   < > = values in this interval not displayed.   No results for input(s): LIPASE, AMYLASE in the last 168 hours. No results for input(s): AMMONIA in the last 168 hours. Coagulation Profile: No results for input(s): INR, PROTIME in the last 168 hours. Cardiac Enzymes: No results for input(s): CKTOTAL, CKMB, CKMBINDEX, TROPONINI in the last 168 hours. BNP (last 3 results) No results for input(s): PROBNP in the last 8760 hours. HbA1C: No results for input(s): HGBA1C in the last 72 hours. CBG: Recent Labs  Lab 08/11/18 2041 08/11/18 2253 08/12/18 0327 08/12/18 0745 08/12/18 1126  GLUCAP 108* 161* 130* 148* 154*   Lipid Profile: No results for input(s): CHOL, HDL, LDLCALC, TRIG, CHOLHDL, LDLDIRECT in the last 72 hours. Thyroid Function Tests: No results for input(s): TSH, T4TOTAL, FREET4, T3FREE, THYROIDAB in the last 72 hours. Anemia Panel: No results for input(s): VITAMINB12, FOLATE, FERRITIN, TIBC, IRON, RETICCTPCT in the last 72 hours. Sepsis Labs: Recent Labs  Lab 08/08/18 1102 08/08/18 1402  LATICACIDVEN 1.0 1.0    Recent Results (from the past 240 hour(s))  Culture, blood (routine x 2)     Status: None (Preliminary result)   Collection Time: 08/09/18  3:46 PM  Result Value Ref Range Status   Specimen Description   Final    BLOOD RIGHT HAND Performed at [facility] [address], [place], [place] [address]    Special Requests   Final    BOTTLES DRAWN AEROBIC ONLY Blood Culture adequate volume Performed at [facility] [address] [place], [place] [address]    Culture   Final    NO GROWTH 3 DAYS Performed at [facility], [address] [place], [place] [address]    Report Status PENDING  Incomplete         Radiology Studies: Dg Chest Port 1 View  Result Date: 08/12/2018 CLINICAL DATA:  Acute respiratory failure. Endotracheally intubated. COVID-19. EXAM: PORTABLE CHEST 1 VIEW COMPARISON:  08/10/2018 FINDINGS: Endotracheal tube and nasogastric tube remain in appropriate position. Stable cardiomegaly. There has been interval improvement in mild bibasilar infiltrates since previous study. No evidence of pleural effusion. IMPRESSION: 1. Interval improvement in mild bibasilar infiltrates. 2. Stable cardiomegaly. Electronically Signed   By: Earle Gell M.D.   On: 08/12/2018 07:35        Scheduled Meds: . chlorhexidine gluconate (MEDLINE KIT)  15 mL Mouth Rinse BID  . Chlorhexidine Gluconate Cloth  6 each Topical Daily  . feeding supplement (PRO-STAT SUGAR FREE 64)  60 mL Per Tube TID  . feeding supplement (VITAL HIGH PROTEIN)  1,000 mL Per Tube Q24H  . insulin aspart  0-9 Units Subcutaneous Q4H  . mouth rinse  15 mL Mouth Rinse 10 times per day  . pantoprazole (PROTONIX) IV  40 mg Intravenous Q24H  . sodium chloride flush  10-40 mL Intracatheter Q12H   Continuous Infusions: .  prismasol BGK 4/2.5 500 mL/hr at 08/12/18 0604  . sodium chloride Stopped (08/10/18 0653)  . amiodarone 30 mg/hr (08/12/18 1100)  . anticoagulant sodium citrate    . calcium gluconate infusion for CRRT 60 mL/hr at 08/12/18 1100  . fentaNYL infusion INTRAVENOUS 75 mcg/hr (08/12/18 1100)  . heparin    . midazolam 2 mg/hr (08/12/18 1100)  . phenylephrine (NEO-SYNEPHRINE) Adult infusion Stopped (08/11/18 1958)  . piperacillin-tazobactam (ZOSYN)  IV 2.25 g (08/12/18 1116)  . prismasol BGK 4/2.5 2,000 mL/hr at 08/12/18 0915  . sodium citrate 2 %/dextrose 2.5% solution 3000 mL 250 mL/hr at  08/12/18 1019     LOS: 14 days    Time spent: 15 minutes    Barb Merino, MD [facility] Pager [phone]  If 7PM-7AM, please contact night-coverage [email or web address] Password [facility] 08/12/2018, 11:43 AM

## 2018-08-12 NOTE — Progress Notes (Signed)
ANTICOAGULATION CONSULT NOTE  Pharmacy Consult for IV heparin Indication: positive COVID patient on CRRT needs systemic anticoagulation, a-fib coverage   No Known Allergies  Patient Measurements: Height: 5\' 10"  (177.8 cm) Weight: (!) 301 lb 5.9 oz (136.7 kg) IBW/kg (Calculated) : 73 Heparin Dosing Weight: 104 kg  Vital Signs: Temp: 98.2 F (36.8 C) (04/15 0600) Temp Source: Axillary (04/15 0600) BP: 113/57 (04/15 0325) Pulse Rate: 137 (04/15 0900)  Labs: Recent Labs    08/10/18 0220  08/11/18 0407 08/11/18 0408 08/11/18 1520  08/11/18 2243  08/12/18 0330 08/12/18 0536 08/12/18 0541 08/12/18 0944  HGB 10.0*   < >  --   --   --    < >  --    < > 8.1* 9.5* 7.8*  --   HCT 31.1*   < >  --   --   --    < >  --    < > 25.6* 28.0* 23.0*  --   PLT 133*  --   --   --   --   --   --   --  122*  --   --   --   APTT 28  --  33  --   --   --   --   --  48*  --   --   --   HEPARINUNFRC  --   --   --   --   --   --  0.13*  --   --   --   --  <0.10*  CREATININE 4.06*  4.10*   < >  --  3.91* 4.15*  --   --   --  4.74*  --   --   --    < > = values in this interval not displayed.    Estimated Creatinine Clearance: 25.4 mL/min (A) (by C-G formula based on SCr of 4.74 mg/dL (H)).   Assessment: 52 y/oM to start heparin infusion per pharmacy. Positive COVID patient on CRRT needs systemic anticoagulation per CCM request. Also with a-fib. Patient previously on systemic heparin, but was stopped after patient developed hematochezia. However, dialysis catheter has continuously clotted off, neccesitating the restart of IV heparin.   Today, 08/12/18:   AM heparin level < 0.1, subtherapeutic on heparin infusion at 1700 units/hr  Hgb decreased to 7.8, Pltc decreased/trended down to 122K   on CRRT     RN reports IV heparin running through port on HD catheter  Heparin level drawn through PICC line. No interruptions in therapy per RN.   Per RN and MD, CRRT running much better after TPA  instilled in HD cath yesterday and start of systemic IV heparin   No bleeding issues reported    Goal of Therapy:  Heparin level 0.3-0.7 units/ml Monitor platelets by anticoagulation protocol: Yes   Plan:   Increase heparin infusion to 2000 units/hr  Check heparin level 8 hours after rate change. Will also check aPTT at that time.   Daily CBC while on heparin infusion.   Monitor closely for signs and symptoms of bleeding   Lindell Spar, PharmD, BCPS Pager: [phone] 08/12/2018 11:36 AM

## 2018-08-12 NOTE — Progress Notes (Signed)
Nutrition Follow-up  RD working remotely.   DOCUMENTATION CODES:   Morbid obesity  INTERVENTION:  - re-start Vital High Protein @ 10 ml/hr with 60 ml prostat TID. This regimen will provide 840 kcal, 111 grams protein, and 201 ml free water.  - when able to begin advancing TF, advance by 10 ml every 8 hours to reach goal rate of Vital High Protein @ 65 ml/hr with 60 ml prostat BID. This regimen will provide 1960 kcal, 196 grams protein, and 1304 ml free water.  - free water flush, if desired, to be per CCM/Nephrology. - recommend 500 mg ascorbic acid BID and 220 mg zinc/day, if feasible, x10-14 days to aid in wound healing.    NUTRITION DIAGNOSIS:   Inadequate oral intake related to inability to eat as evidenced by NPO status. -ongoing  GOAL:   Provide needs based on ASPEN/SCCM guidelines -unable to meet at this time.  MONITOR:   Vent status, TF tolerance, Labs, Weight trends, Skin, I & O's  ASSESSMENT:   53 year-old male admitted on 4/1 with abdominal pain which had been persistent for several days; noted to have diverticulitis at [facility] and was transferred to [facility] for further evaluation. Patient found to be COVID POSITIVE at PCP office earlier in the week. PCCM consulted 4/2 for worsening hypoxemia.   Significant Events: 4/1- admitted 4/2- intubated and OGT placed; proned; triple lumen PICC placed 4/3- proned 4/5- CRRT initiation 4/6- supine 4/9- CRRT stopped d/t persistent filter clotting, inability  to resolve issue; trickle rate TF initiated; CRRT re-started 4/10- TF increased to 35 ml/hr with plan to advance by 10 ml every 8 hours to goal of 65 ml/hr 4/11- TF placed on hold due hematochezia 4/12- HD cath moved to R femoral area; brief PEA episode which spontaneously resolved 4/14- HD cath and CRRT filters continuing to clog; started on heparin 4/15- plan to re-start TF at 10 ml/hr   Weight with some fluctuations over the past few days. Patient  remains intubated with OGT in place. Patient was noted to have hematochezia on 4/11 and TF was placed on hold at that time. Able to communicate with RN via secure chat who confirms that patient has not been receiving TF and that she has received an order from [facility] NP) to initiate TF at 10 ml/hr today. RN reports she has had patient since Monday and patient has had no bleeding during this time frame.    Per [employer] note earlier this AM: ARDS d/t COVID-19, may require trach, 500 ml pulled off from CRRT over the past 24 hours, perforated diverticulum present on admission, not a surgical candidate, plan to re-start trickle TF today but will not use the gut for medications, AKI from ATN with ongoing CRRT, acute metabolic encephalopathy.   Patient is currently intubated on ventilator support MV: 15.3 L/min as of 8:03 AM Temp (24hrs), Avg:97.9 F (36.6 C), Min:96.6 F (35.9 C), Max:98.8 F (37.1 C) Propofol: none  Medications reviewed; sliding scale novolog. Labs reviewed; CBGs: 130 and 148 mg/dl today, Na: 133 mmol/l, Cl: 96 mmol/l, Ca: 8.6 mg/dl, ionized Ca: 1.14 mmol/l, Phos: 5.8 mg/dl, BUN: 77 mg/dl, creatinine: 4.74 mg/dl, GFR: 15 ml/min. Drips; amiodarone @ 30 mg/hr, fentanyl @ 75 mcg/hr, heparin @ 1700 units/hr, versed @ 2 mg/hr    NUTRITION - FOCUSED PHYSICAL EXAM:  unable to complete at this time.   Diet Order:   Diet Order            Diet  NPO time specified  Diet effective now              EDUCATION NEEDS:   No education needs have been identified at this time  Skin:  Skin Assessment: Skin Integrity Issues: Skin Integrity Issues:: Stage II, DTI DTI: coccyx (new 4/12) Stage II: bilateral face (new 4/10)  Last BM:  4/11  Height:   Ht Readings from Last 1 Encounters:  08/10/18 5\' 10"  (1.778 m)    Weight:   Wt Readings from Last 1 Encounters:  08/12/18 (!) 136.7 kg    Ideal Body Weight:  75.45 kg  BMI:  Body mass index is 43.24 kg/m.  Estimated  Nutritional Needs:   Kcal:  3795-5831 kcal  Protein:  >/= 189 grams  Fluid:  >/= 2.2 L/day     Jarome Matin, MS, RD, LDN, [facility] Inpatient Clinical Dietitian Pager # [phone] After hours/weekend pager # [phone]

## 2018-08-12 NOTE — Progress Notes (Addendum)
NAME:  [name] Long, MRN:  [id number], DOB:  Jul 30, 1965, LOS: 58 ADMISSION DATE:  08/14/2018, CONSULTATION DATE:  07/30/2018 REFERRING MD:  07/30/2018, CHIEF COMPLAINT:   Brief History   53 yo male presented to [facility] with several days of abdominal pain from diverticulitis with retained perforation.  Developed pneumonia with hypoxia from [place].  Transferred to [facility].  Past Medical History  DM2, HTN, Former smoker, OSA, A fib  [facility] Events   4/01 admission for diverticulitis 4/02 PCCM consulted for worsening dyspnea, hypoxemia 4/02 Proned  4/03 Proned  4/05 Improved oxygenation however ABG with respiratory and metabolic acidosis. Poor UOP 4/06 Supine; completed plaquenil 4/07 CVVHD, difficulty with filters clotting  4/08 CVVHD ongoing, AFwRVR afternoon of 4/8, started on amio gtt   4/12 hemodialysis catheter changed to right femoral location; brief episode of PEA resolved sponateously 08/11/2018 hemodialysis catheter and filter for CRRT machine or clotting off. 08/11/2018 started on heparin drip for atrial fibrillation in filter clotting on CRRT machine  Consults:  Renal GI signed off Surgery signed off Procedures:  ETT 4/2 >>  L IJ HD 4/6 >> 4/12 R Rad Aline 4/6 >>  4/12 right femoral HD catheter 24 cm>>  Significant Diagnostic Tests:  CT chest 4/01 [name] Long) >> b/l GGO in periphery CT abd/pelvis 4/01 [name] Long) >> perforated diverticulum lower descending colon, fatty liver with ?cirrhosis, splenomegaly 18.4 cm  Micro Data:  COVID 3/30 (PCP office) >> DETECTED Blood 4/12 >>  Antimicrobials:  Zosyn 4/1 >> 4/7 Zoysn 4/12 >>  Interim history/subjective:  Remains on full ventilatory support.  Having difficulty maintaining CRRT due to catheter and filter clotting off  Objective   Blood pressure (!) 113/57, pulse (!) 142, temperature 98.2 F (36.8 C), temperature source Axillary, resp. rate (!) 30, height 5\' 10"  (1.778 m), weight (!) 136.7 kg, SpO2 100 %. CVP:   [8 mmHg-28 mmHg] 9 mmHg  Vent Mode: PRVC FiO2 (%):  [0.2 %-40 %] 40 % Set Rate:  [30 bmp] 30 bmp Vt Set:  [510 mL] 510 mL PEEP:  [8 cmH20-10 cmH20] 8 cmH20 Plateau Pressure:  [23 cmH20] 23 cmH20   Intake/Output Summary (Last 24 hours) at 08/12/2018 0838 Last data filed at 08/12/2018 0800 Gross per 24 hour  Intake 2583.7 ml  Output 3091 ml  Net -507.3 ml   Filed Weights   08/10/18 0329 08/11/18 0546 08/12/18 0500  Weight: (!) 139.4 kg 134.4 kg (!) 136.7 kg   Physical Exam:  General: Morbidly obese male who is currently heavily sedated on full mechanical ventilatory support HEENT: Pupils equal reactive and pinpoint.  He does have some abrasions around his nares.  No JVD or lymphadenopathy is appreciated Neuro: Opens eyes to loud verbal stimulus CV: Heart sounds irregular irregular with poorly controlled ventricular rate of 142 in the setting of atrial fibrillation PULM: Coarse rhonchi bilaterally, decreased breath sounds in the bases GI: Abdomen is less distended, OG tube is currently to intermittent wall suction with approximately 50 cc an hour of bile drainage. Extremities: Warm and dry, 2+ edema, Skin: Warm to touch   08/12/2018 chest x-ray reveals proved variation, support apparatus in proper position but still evidence of pulmonary edema and airspace disease   Resolved Hospital Problem list   Mixed Acidosis   Assessment & Plan:   Acute hypoxic respiratory failure with ARDS from COVID 19 pneumonia. Hx of OSA. Discussion: Not candidate for tocilizumab due to diverticulitis. Plan Continue full vent survives. Begin weaning FiO2 and PEEP Due to his underlying  obstructive sleep apnea and severe ARDS he may require tracheostomy to be liberated from mechanical ventilatory support If he survives he may need a tracheostomy Wean FiO2 as tolerated Intermittent chest x-rays Continue to draw negative with CRRT as tolerated.  -500 cc for 24 hours  Perforated diverticulum.  Hematochezia. Plan Continue Zosyn Appreciate GI and surgical consults he is not a surgical candidate.  GI recommends restarting tube feedings continue antimicrobial therapy. 08/12/2018 we will start trickle tube feedings we will not use the gut for medications at this time until her positive dysfunction properly.  AKI from ATN. Lab Results  Component Value Date   CREATININE 4.74 (H) 08/12/2018   CREATININE 4.15 (H) 08/11/2018   CREATININE 3.91 (H) 08/11/2018     Plan Continue CRRT per renal Trouble with filter clotting is noted hopefully with the institution of heparin drip this will be improved Right femoral HD catheter appears to be functioning properly  DM type II. CBG (last 3)  Recent Labs    08/11/18 2253 08/12/18 0327 08/12/18 0745  GLUCAP 161* 130* 148*    Plan Sliding scale insulin protocol with marginal control on 08/12/2018  Paroxysmal A fib (present prior to admission) Noted has some type of bradycardic event on 08/11/2018 but did not have a PEA arrest Plan Amiodarone drip at 16.7 Heparin drip per pharmacy Mains in atrial fibrillation with a ventricular rate of from 1 20-1 42 depending on sedation  Acute metabolic encephalopathy. Plan 08/12/2018 currently on fentanyl drip and Versed drip.  Maintain RA SS score of -2-3 08/12/2018 decrease sedation to observe for possible continued weaning from mechanical ventilatory support   Best practice:  Diet: NPO, tube feedings on hold due to perforated diverticulum abdominal distention  DVT prophylaxis: SCDs GI prophylaxis: pantoprazole Mobility: bed rest Code Status: No CPR, defibrillation  Family Communication: Updated wife at length on 08/12/2018.  She is okay with tracheostomy as she is RN who understands the ramifications. Disposition: ICU  Labs    CMP Latest Ref Rng & Units 08/12/2018 08/12/2018 08/12/2018  Glucose 70 - 99 mg/dL - - 134(H)  BUN 6 - 20 mg/dL - - 77(H)  Creatinine 0.61 - 1.24 mg/dL - - 4.74(H)   Sodium 135 - 145 mmol/L 133(L) 137 134(L)  Potassium 3.5 - 5.1 mmol/L 4.7 4.3 4.8  Chloride 98 - 111 mmol/L - - 96(L)  CO2 22 - 32 mmol/L - - 25  Calcium 8.9 - 10.3 mg/dL - - 8.6(L)  Total Protein 6.5 - 8.1 g/dL - - -  Total Bilirubin 0.3 - 1.2 mg/dL - - -  Alkaline Phos 38 - 126 U/L - - -  AST 15 - 41 U/L - - -  ALT 0 - 44 U/L - - -   CBC Latest Ref Rng & Units 08/12/2018 08/12/2018 08/12/2018  WBC 4.0 - 10.5 K/uL - - 3.1(L)  Hemoglobin 13.0 - 17.0 g/dL 7.8(L) 9.5(L) 8.1(L)  Hematocrit 39.0 - 52.0 % 23.0(L) 28.0(L) 25.6(L)  Platelets 150 - 400 K/uL - - 122(L)   ABG    Component Value Date/Time   PHART 7.437 08/12/2018 0541   PCO2ART 42.0 08/12/2018 0541   PO2ART 128.0 (H) 08/12/2018 0541   HCO3 28.3 (H) 08/12/2018 0541   TCO2 30 08/12/2018 0541   ACIDBASEDEF 2.0 08/10/2018 1554   O2SAT 99.0 08/12/2018 0541   CBG (last 3)  Recent Labs    08/11/18 2253 08/12/18 0327 08/12/18 0745  GLUCAP 161* 130* 148*    App CC time 40  minutes  [facility] Landry Arlean Thies ACNP Maryanna Shape PCCM Pager [phone] till 1 pm If no answer page [phone] 08/12/2018, 8:38 AM

## 2018-08-12 NOTE — Progress Notes (Signed)
Pharmacy Antibiotic Note  [name] Long is a 53 y.o. male admitted on 08/20/2018 with COVID-19 and diverticulitis. Pharmacy has been consulted for Zosyn dosing for intra-abdominal infection on 08/09/2018. NKDA. Pt was previously on antibiotics this admission, last received on 08/04/2018.   Today, 08/12/18 -D3 resumption of antibiotics -WBC 3.1, low  -Tmax 98.8 -currently on CRRT  Plan:  Continue Zosyn 2.25g IV q6h while on CRRT  Monitor renal function, culture data, clinical course  Height: 5\' 10"  (177.8 cm) Weight: (!) 301 lb 5.9 oz (136.7 kg) IBW/kg (Calculated) : 73  Temp (24hrs), Avg:97.9 F (36.6 C), Min:96.6 F (35.9 C), Max:98.8 F (37.1 C)  Recent Labs  Lab 08/08/18 0420  08/08/18 1102 08/08/18 1103 08/08/18 1402  08/09/18 0327  08/10/18 0220 08/10/18 1617 08/11/18 0408 08/11/18 1520 08/12/18 0330  WBC 11.2*  --   --  11.1*  --   --  7.8  --  6.0  --   --   --  3.1*  CREATININE 3.84*   < >  --   --   --    < > 5.17*   < > 4.06*  4.10* 4.19* 3.91* 4.15* 4.74*  LATICACIDVEN  --   --  1.0  --  1.0  --   --   --   --   --   --   --   --    < > = values in this interval not displayed.    Estimated Creatinine Clearance: 25.4 mL/min (A) (by C-G formula based on SCr of 4.74 mg/dL (H)).    No Known Allergies  Antimicrobials this admission: Zosyn 4/2 >> 4/7, then resumed 4/12 > Azithromycin 4/2 >> 4/6 Hydroxychloroquine 4/2 >> 4/6 Vancomycin 4/12 >> 4/13  Microbiology results: 3/30 COVID-19: positive outpatient PTA 4/2 MRSA PCR: negative  4/12 BCx: NGTD   Thank you for allowing pharmacy to be a part of this patient's care.   Lindell Spar, PharmD, BCPS Pager: [phone] 08/12/2018 9:07 AM

## 2018-08-12 NOTE — Progress Notes (Signed)
[facility] ROUNDING NOTE   Subjective:   This is a 53 year old gentleman diabetes hypertension obstructive sleep apnea on CPAP.  Admitted with  COVID-19.  He has been treated by CCM with Plaquenil azithromycin.  CRRT was initiated 08/02/2018.  Has had some clotting of the filter and required initiation of heparin with dialysis 08/03/2018. Then still clotting was changed to citrate anticoagulation.  He has been kept even.  Has been placed back on pressors.  Still not making great progress.   Daily Update: Resumed CRRT yest afternoon after fem cath TPA'd w/ good return of cath function. Pt started on systemic IV heparin for HD cath clotting and afib coverage.  Getting citrate regional anticoag overnight as well.  Net I/O is even for yesterday.  CVP 9, wt stable at 136kg. Appears to be off of neo gtt today.  CXR shows improvement in IS infiltrates, CXR clear today.     Objective:  Vital signs in last 24 hours:  Temp:  [96.6 F (35.9 C)-98.8 F (37.1 C)] 98.2 F (36.8 C) (04/15 0600) Pulse Rate:  [118-151] 142 (04/15 0817) Resp:  [14-38] 30 (04/15 0817) BP: (113-139)/(57-74) 113/57 (04/15 0325) SpO2:  [96 %-100 %] 100 % (04/15 0817) Arterial Line BP: (109-159)/(52-83) 159/83 (04/15 0817) FiO2 (%):  [0.2 %-40 %] 40 % (04/15 0803) Weight:  [136.7 kg] 136.7 kg (04/15 0500)  Weight change: 2.3 kg Filed Weights   08/10/18 0329 08/11/18 0546 08/12/18 0500  Weight: (!) 139.4 kg 134.4 kg (!) 136.7 kg    Intake/Output: I/O last 3 completed shifts: In: 4148.2 [P.O.:30; I.V.:3725.9; Other:60; NG/GT:65; IV Piggyback:267.3] Out: 4687 [Emesis/NG output:1100; Other:3587]   Intake/Output this shift:  Total I/O In: 116.5 [I.V.:116.5] Out: 264 [Emesis/NG output:50; Other:214]  Patient not physically examined in room due to patient being + for covid 19. Exam findings derived from RN, staff assessments and primary team assessments.   Assessment/ Plan:   Acute renal failure:  Suspected ATN possibly contrast related.  Worsening respiratory acidosis and metabolic acidosis >> CRRT initiated 08/02/18. D#11 CRRT today. Fem HD cath function better after TPA 4/14 and started on systemic IV heparin as well.  CXR improved. I/O even, appears to be off pressors this am, will confirm w/ staff later this am.  CVP's stable.   Pneumonia/ COVID-19/ VDRF:  on vent, 40% FiO2  Afib w/ RVR  Diabetes mellitus per primary team  Obesity and obstructive sleep apnea  History of atrial fibrillation: on IV amiodarone  Electrolytes appear to be stable  Acid-base: resp / metabolic acidodis , controlled at this time  Volume: being kept even at this time. CXR better.       LOS: 12 [name] Long '@TODAY' '@8' :03 AM  Basic Metabolic Panel: Recent Labs  Lab 08/08/18 0420  08/09/18 0327  08/10/18 0220  08/10/18 1617  08/11/18 0407 08/11/18 0408 08/11/18 1520  08/12/18 0146 08/12/18 0159 08/12/18 0330 08/12/18 0536 08/12/18 0541  NA 137   < > 138   < > 133*  134*   < > 132*   < >  --  134* 138   < > 138 134* 134* 137 133*  K 3.2*   < > 4.3   < > 3.9  3.9   < > 4.3   < >  --  4.6 4.4   < > 4.1 4.8 4.8 4.3 4.7  CL 107   < > 103   < > 99  99  --  97*  --   --  96* 107  --   --   --  96*  --   --   CO2 24   < > 20*   < > 21*  20*  --  22  --   --  25 21*  --   --   --  25  --   --   GLUCOSE 130*   < > 164*   < > 165*  164*  --  137*  --   --  165* 127*  --   --   --  134*  --   --   BUN 68*   < > 98*   < > 83*  84*  --  83*  --   --  78* 80*  --   --   --  77*  --   --   CREATININE 3.84*   < > 5.17*   < > 4.06*  4.10*  --  4.19*  --   --  3.91* 4.15*  --   --   --  4.74*  --   --   CALCIUM 7.1*   < > 8.1*   < > 8.7*  8.6*  --  8.4*  --   --  8.7* 6.8*  --   --   --  8.6*  --   --   MG 2.6*  --  2.7*  --  2.2  --   --   --  2.3  --   --   --   --   --  2.4  --   --   PHOS  --    < > 6.2*   < > 4.6  4.7*  --  4.8*  --   --  5.0* 5.7*  --   --   --  5.8*  --   --    < > =  values in this interval not displayed.    Liver Function Tests: Recent Labs  Lab 08/08/18 0420  08/09/18 1600 08/10/18 0220 08/10/18 1617 08/11/18 0408 08/11/18 1520  AST 61*  --   --   --   --   --   --   ALT 34  --   --   --   --   --   --   ALKPHOS 82  --   --   --   --   --   --   BILITOT 2.0*  --   --   --   --   --   --   PROT 5.5*  --   --   --   --   --   --   ALBUMIN 2.0*   < > 2.2* 1.9* 1.9* 2.2* 1.7*   < > = values in this interval not displayed.   No results for input(s): LIPASE, AMYLASE in the last 168 hours. No results for input(s): AMMONIA in the last 168 hours.  CBC: Recent Labs  Lab 08/08/18 0420 08/08/18 1103  08/09/18 0327  08/10/18 0220  08/12/18 0146 08/12/18 0159 08/12/18 0330 08/12/18 0536 08/12/18 0541  WBC 11.2* 11.1*  --  7.8  --  6.0  --   --   --  3.1*  --   --   NEUTROABS  --   --   --   --   --  4.9  --   --   --   --   --   --   HGB  9.8* 10.8*   < > 10.4*   < > 10.0*   < > 10.5* 7.8* 8.1* 9.5* 7.8*  HCT 32.2* 33.1*   < > 32.7*   < > 31.1*   < > 31.0* 23.0* 25.6* 28.0* 23.0*  MCV 93.9 95.4  --  94.5  --  93.7  --   --   --  91.8  --   --   PLT 169 190  --  197  --  133*  --   --   --  122*  --   --    < > = values in this interval not displayed.    Cardiac Enzymes: No results for input(s): CKTOTAL, CKMB, CKMBINDEX, TROPONINI in the last 168 hours.  BNP: Invalid input(s): POCBNP  CBG: Recent Labs  Lab 08/11/18 1509 08/11/18 2041 08/11/18 2253 08/12/18 0327 08/12/18 0745  GLUCAP 144* 108* 161* 130* 148*    Microbiology: Results for orders placed or performed during the hospital encounter of 08/08/2018  MRSA PCR Screening     Status: None   Collection Time: 07/30/18  2:30 AM  Result Value Ref Range Status   MRSA by PCR NEGATIVE NEGATIVE Final    Comment:        The GeneXpert MRSA Assay (FDA approved for NASAL specimens only), is one component of a comprehensive MRSA colonization surveillance program. It is not intended  to diagnose MRSA infection nor to guide or monitor treatment for MRSA infections. Performed at [facility], [place] [address], [place], [place] [address]   Culture, blood (routine x 2)     Status: None (Preliminary result)   Collection Time: 08/09/18  3:46 PM  Result Value Ref Range Status   Specimen Description   Final    BLOOD RIGHT HAND Performed at [facility] [address] [place], [place] [address]    Special Requests   Final    BOTTLES DRAWN AEROBIC ONLY Blood Culture adequate volume Performed at [facility] [address], [place], [place] [address]    Culture   Final    NO GROWTH 2 DAYS Performed at [facility] [address] [place], [place] [address]    Report Status PENDING  Incomplete    Coagulation Studies: No results for input(s): LABPROT, INR in the last 72 hours.  Urinalysis: No results for input(s): COLORURINE, LABSPEC, PHURINE, GLUCOSEU, HGBUR, BILIRUBINUR, KETONESUR, PROTEINUR, UROBILINOGEN, NITRITE, LEUKOCYTESUR in the last 72 hours.  Invalid input(s): APPERANCEUR    Imaging: Dg Chest Port 1 View  Result Date: 08/12/2018 CLINICAL DATA:  Acute respiratory failure. Endotracheally intubated. COVID-19. EXAM: PORTABLE CHEST 1 VIEW COMPARISON:  08/10/2018 FINDINGS: Endotracheal tube and nasogastric tube remain in appropriate position. Stable cardiomegaly. There has been interval improvement in mild bibasilar infiltrates since previous study. No evidence of pleural effusion. IMPRESSION: 1. Interval improvement in mild bibasilar infiltrates. 2. Stable cardiomegaly. Electronically Signed   By: Earle Gell M.D.   On: 08/12/2018 07:35     Medications:   .  prismasol BGK 4/2.5 500 mL/hr at 08/12/18 0604  . sodium chloride Stopped (08/10/18 0653)  . amiodarone 30 mg/hr (08/12/18 0800)  . anticoagulant sodium citrate    . calcium gluconate infusion for CRRT 60 mL/hr at 08/12/18 0800  .  dexmedetomidine (PRECEDEX) IV infusion Stopped (08/10/18 1115)  . fentaNYL infusion INTRAVENOUS 75 mcg/hr (08/12/18 0804)  . heparin 1,700 Units/hr (08/12/18 0800)  . midazolam 2 mg/hr (08/12/18 0800)  .  phenylephrine (NEO-SYNEPHRINE) Adult infusion Stopped (08/11/18 1958)  . piperacillin-tazobactam (ZOSYN)  IV Stopped (08/12/18 0557)  . prismasol BGK 4/2.5 2,000 mL/hr at 08/12/18 9340  . sodium citrate 2 %/dextrose 2.5% solution 3000 mL 340 mL/hr at 08/12/18 0748   . chlorhexidine gluconate (MEDLINE KIT)  15 mL Mouth Rinse BID  . Chlorhexidine Gluconate Cloth  6 each Topical Daily  . feeding supplement (PRO-STAT SUGAR FREE 64)  60 mL Per Tube BID  . insulin aspart  0-9 Units Subcutaneous Q4H  . mouth rinse  15 mL Mouth Rinse 10 times per day  . pantoprazole (PROTONIX) IV  40 mg Intravenous Q24H  . sodium chloride flush  10-40 mL Intracatheter Q12H   Place/Maintain arterial line **AND** sodium chloride, acetaminophen (TYLENOL) oral liquid 160 mg/5 mL, albuterol, anticoagulant sodium citrate, fentaNYL, hydrALAZINE, lip balm, midazolam, midazolam, [DISCONTINUED] ondansetron **OR** ondansetron (ZOFRAN) IV, sodium chloride flush

## 2018-08-12 NOTE — Progress Notes (Signed)
Brief pharmacy anti-coagulation note:   For full details see note from Justin Mend D from earlier today  Plan and Assessment -HL 0.11 subtherapeutic (goal 0.3-0.7) -APTT 63 subtherapeutic (goal 66-102) -Hgb 7.8 -D/w RN, no bleeding, CRRT is running fine (not clotting off) - increase heparin to 2300 units/hr - check HL and aPTT in 8 hours  Dolly Rias RPh 08/12/2018, 9:11 PM Pager [phone]

## 2018-08-13 LAB — POCT I-STAT EG7
Acid-Base Excess: 4 mmol/L — ABNORMAL HIGH (ref 0.0–2.0)
Acid-Base Excess: 4 mmol/L — ABNORMAL HIGH (ref 0.0–2.0)
Acid-Base Excess: 5 mmol/L — ABNORMAL HIGH (ref 0.0–2.0)
Acid-Base Excess: 5 mmol/L — ABNORMAL HIGH (ref 0.0–2.0)
Bicarbonate: 30.3 mmol/L — ABNORMAL HIGH (ref 20.0–28.0)
Bicarbonate: 30.8 mmol/L — ABNORMAL HIGH (ref 20.0–28.0)
Bicarbonate: 31.1 mmol/L — ABNORMAL HIGH (ref 20.0–28.0)
Bicarbonate: 32.1 mmol/L — ABNORMAL HIGH (ref 20.0–28.0)
Calcium, Ion: 0.37 mmol/L — CL (ref 1.15–1.40)
Calcium, Ion: 0.55 mmol/L — CL (ref 1.15–1.40)
Calcium, Ion: 0.4 mmol/L — CL (ref 1.15–1.40)
Calcium, Ion: 0.45 mmol/L — CL (ref 1.15–1.40)
HCT: 29 % — ABNORMAL LOW (ref 39.0–52.0)
HCT: 30 % — ABNORMAL LOW (ref 39.0–52.0)
HCT: 30 % — ABNORMAL LOW (ref 39.0–52.0)
HCT: 31 % — ABNORMAL LOW (ref 39.0–52.0)
Hemoglobin: 10.2 g/dL — ABNORMAL LOW (ref 13.0–17.0)
Hemoglobin: 9.9 g/dL — ABNORMAL LOW (ref 13.0–17.0)
Hemoglobin: 10.2 g/dL — ABNORMAL LOW (ref 13.0–17.0)
Hemoglobin: 10.5 g/dL — ABNORMAL LOW (ref 13.0–17.0)
O2 Saturation: 58 %
O2 Saturation: 58 %
O2 Saturation: 67 %
O2 Saturation: 70 %
Patient temperature: 97
Patient temperature: 97.1
Potassium: 3.9 mmol/L (ref 3.5–5.1)
Potassium: 4.1 mmol/L (ref 3.5–5.1)
Potassium: 3.8 mmol/L (ref 3.5–5.1)
Potassium: 3.8 mmol/L (ref 3.5–5.1)
Sodium: 137 mmol/L (ref 135–145)
Sodium: 138 mmol/L (ref 135–145)
Sodium: 138 mmol/L (ref 135–145)
Sodium: 138 mmol/L (ref 135–145)
TCO2: 32 mmol/L (ref 22–32)
TCO2: 32 mmol/L (ref 22–32)
TCO2: 33 mmol/L — ABNORMAL HIGH (ref 22–32)
TCO2: 34 mmol/L — ABNORMAL HIGH (ref 22–32)
pCO2, Ven: 53.3 mmHg (ref 44.0–60.0)
pCO2, Ven: 53.3 mmHg (ref 44.0–60.0)
pCO2, Ven: 53.2 mmHg (ref 44.0–60.0)
pCO2, Ven: 58.6 mmHg (ref 44.0–60.0)
pH, Ven: 7.359 (ref 7.250–7.430)
pH, Ven: 7.366 (ref 7.250–7.430)
pH, Ven: 7.347 (ref 7.250–7.430)
pH, Ven: 7.374 (ref 7.250–7.430)
pO2, Ven: 30 mmHg — CL (ref 32.0–45.0)
pO2, Ven: 31 mmHg — CL (ref 32.0–45.0)
pO2, Ven: 38 mmHg (ref 32.0–45.0)
pO2, Ven: 38 mmHg (ref 32.0–45.0)

## 2018-08-13 LAB — RENAL FUNCTION PANEL
Albumin: 2.1 g/dL — ABNORMAL LOW (ref 3.5–5.0)
Albumin: 2.2 g/dL — ABNORMAL LOW (ref 3.5–5.0)
Anion gap: 13 (ref 5–15)
Anion gap: 13 (ref 5–15)
BUN: 51 mg/dL — ABNORMAL HIGH (ref 6–20)
BUN: 46 mg/dL — ABNORMAL HIGH (ref 6–20)
CO2: 29 mmol/L (ref 22–32)
CO2: 31 mmol/L (ref 22–32)
Calcium: 9.2 mg/dL (ref 8.9–10.3)
Calcium: 9.4 mg/dL (ref 8.9–10.3)
Chloride: 94 mmol/L — ABNORMAL LOW (ref 98–111)
Chloride: 93 mmol/L — ABNORMAL LOW (ref 98–111)
Creatinine, Ser: 3.37 mg/dL — ABNORMAL HIGH (ref 0.61–1.24)
Creatinine, Ser: 3.17 mg/dL — ABNORMAL HIGH (ref 0.61–1.24)
GFR calc Af Amer: 23 mL/min — ABNORMAL LOW (ref >60)
GFR calc Af Amer: 25 mL/min — ABNORMAL LOW (ref >60)
GFR calc non Af Amer: 20 mL/min — ABNORMAL LOW (ref >60)
GFR calc non Af Amer: 21 mL/min — ABNORMAL LOW (ref >60)
Glucose, Bld: 161 mg/dL — ABNORMAL HIGH (ref 70–99)
Glucose, Bld: 156 mg/dL — ABNORMAL HIGH (ref 70–99)
Phosphorus: 3.1 mg/dL (ref 2.5–4.6)
Phosphorus: 2.9 mg/dL (ref 2.5–4.6)
Potassium: 3.9 mmol/L (ref 3.5–5.1)
Potassium: 3.8 mmol/L (ref 3.5–5.1)
Sodium: 136 mmol/L (ref 135–145)
Sodium: 137 mmol/L (ref 135–145)

## 2018-08-13 LAB — POCT I-STAT 7, (LYTES, BLD GAS, ICA,H+H)
Acid-Base Excess: 7 mmol/L — ABNORMAL HIGH (ref 0.0–2.0)
Acid-Base Excess: 7 mmol/L — ABNORMAL HIGH (ref 0.0–2.0)
Acid-Base Excess: 11 mmol/L — ABNORMAL HIGH (ref 0.0–2.0)
Acid-Base Excess: 12 mmol/L — ABNORMAL HIGH (ref 0.0–2.0)
Bicarbonate: 31.6 mmol/L — ABNORMAL HIGH (ref 20.0–28.0)
Bicarbonate: 31.8 mmol/L — ABNORMAL HIGH (ref 20.0–28.0)
Bicarbonate: 35.2 mmol/L — ABNORMAL HIGH (ref 20.0–28.0)
Bicarbonate: 37.1 mmol/L — ABNORMAL HIGH (ref 20.0–28.0)
Calcium, Ion: 1.16 mmol/L (ref 1.15–1.40)
Calcium, Ion: 1.17 mmol/L (ref 1.15–1.40)
Calcium, Ion: 1.1 mmol/L — ABNORMAL LOW (ref 1.15–1.40)
Calcium, Ion: 1.14 mmol/L — ABNORMAL LOW (ref 1.15–1.40)
HCT: 23 % — ABNORMAL LOW (ref 39.0–52.0)
HCT: 23 % — ABNORMAL LOW (ref 39.0–52.0)
HCT: 23 % — ABNORMAL LOW (ref 39.0–52.0)
HCT: 24 % — ABNORMAL LOW (ref 39.0–52.0)
Hemoglobin: 7.8 g/dL — ABNORMAL LOW (ref 13.0–17.0)
Hemoglobin: 7.8 g/dL — ABNORMAL LOW (ref 13.0–17.0)
Hemoglobin: 7.8 g/dL — ABNORMAL LOW (ref 13.0–17.0)
Hemoglobin: 8.2 g/dL — ABNORMAL LOW (ref 13.0–17.0)
O2 Saturation: 99 %
O2 Saturation: 99 %
O2 Saturation: 97 %
O2 Saturation: 99 %
Patient temperature: 97.1
Patient temperature: 97.7
Potassium: 4.2 mmol/L (ref 3.5–5.1)
Potassium: 4.3 mmol/L (ref 3.5–5.1)
Potassium: 3.9 mmol/L (ref 3.5–5.1)
Potassium: 5.3 mmol/L — ABNORMAL HIGH (ref 3.5–5.1)
Sodium: 134 mmol/L — ABNORMAL LOW (ref 135–145)
Sodium: 134 mmol/L — ABNORMAL LOW (ref 135–145)
Sodium: 133 mmol/L — ABNORMAL LOW (ref 135–145)
Sodium: 134 mmol/L — ABNORMAL LOW (ref 135–145)
TCO2: 33 mmol/L — ABNORMAL HIGH (ref 22–32)
TCO2: 33 mmol/L — ABNORMAL HIGH (ref 22–32)
TCO2: 36 mmol/L — ABNORMAL HIGH (ref 22–32)
TCO2: 39 mmol/L — ABNORMAL HIGH (ref 22–32)
pCO2 arterial: 41.8 mmHg (ref 32.0–48.0)
pCO2 arterial: 42.8 mmHg (ref 32.0–48.0)
pCO2 arterial: 43.9 mmHg (ref 32.0–48.0)
pCO2 arterial: 48.7 mmHg — ABNORMAL HIGH (ref 32.0–48.0)
pH, Arterial: 7.475 — ABNORMAL HIGH (ref 7.350–7.450)
pH, Arterial: 7.485 — ABNORMAL HIGH (ref 7.350–7.450)
pH, Arterial: 7.489 — ABNORMAL HIGH (ref 7.350–7.450)
pH, Arterial: 7.512 — ABNORMAL HIGH (ref 7.350–7.450)
pO2, Arterial: 130 mmHg — ABNORMAL HIGH (ref 83.0–108.0)
pO2, Arterial: 133 mmHg — ABNORMAL HIGH (ref 83.0–108.0)
pO2, Arterial: 124 mmHg — ABNORMAL HIGH (ref 83.0–108.0)
pO2, Arterial: 88 mmHg (ref 83.0–108.0)

## 2018-08-13 LAB — BLOOD GAS, ARTERIAL
Acid-Base Excess: 7.6 mmol/L — ABNORMAL HIGH (ref 0.0–2.0)
Bicarbonate: 31.8 mmol/L — ABNORMAL HIGH (ref 20.0–28.0)
Drawn by: 422461
FIO2: 40
MECHVT: 510 mL
O2 Saturation: 98.8 %
PEEP: 8 cmH2O
Patient temperature: 97
RATE: 30 resp/min
pCO2 arterial: 42.5 mmHg (ref 32.0–48.0)
pH, Arterial: 7.482 — ABNORMAL HIGH (ref 7.350–7.450)
pO2, Arterial: 130 mmHg — ABNORMAL HIGH (ref 83.0–108.0)

## 2018-08-13 LAB — CBC WITH DIFFERENTIAL/PLATELET
Abs Immature Granulocytes: 0.01 10*3/uL (ref 0.00–0.07)
Basophils Absolute: 0 10*3/uL (ref 0.0–0.1)
Basophils Relative: 0 %
Eosinophils Absolute: 0.1 10*3/uL (ref 0.0–0.5)
Eosinophils Relative: 2 %
HCT: 26.4 % — ABNORMAL LOW (ref 39.0–52.0)
Hemoglobin: 8.5 g/dL — ABNORMAL LOW (ref 13.0–17.0)
Immature Granulocytes: 0 %
Lymphocytes Relative: 20 %
Lymphs Abs: 0.6 10*3/uL — ABNORMAL LOW (ref 0.7–4.0)
MCH: 29.6 pg (ref 26.0–34.0)
MCHC: 32.2 g/dL (ref 30.0–36.0)
MCV: 92 fL (ref 80.0–100.0)
Monocytes Absolute: 0.4 10*3/uL (ref 0.1–1.0)
Monocytes Relative: 14 %
Neutro Abs: 1.8 10*3/uL (ref 1.7–7.7)
Neutrophils Relative %: 64 %
Platelets: 137 10*3/uL — ABNORMAL LOW (ref 150–400)
RBC: 2.87 MIL/uL — ABNORMAL LOW (ref 4.22–5.81)
RDW: 19 % — ABNORMAL HIGH (ref 11.5–15.5)
WBC: 2.9 10*3/uL — ABNORMAL LOW (ref 4.0–10.5)
nRBC: 0 % (ref 0.0–0.2)

## 2018-08-13 LAB — GLUCOSE, CAPILLARY
Glucose-Capillary: 148 mg/dL — ABNORMAL HIGH (ref 70–99)
Glucose-Capillary: 145 mg/dL — ABNORMAL HIGH (ref 70–99)
Glucose-Capillary: 175 mg/dL — ABNORMAL HIGH (ref 70–99)
Glucose-Capillary: 159 mg/dL — ABNORMAL HIGH (ref 70–99)
Glucose-Capillary: 160 mg/dL — ABNORMAL HIGH (ref 70–99)

## 2018-08-13 LAB — HEPARIN LEVEL (UNFRACTIONATED)
Heparin Unfractionated: 0.15 IU/mL — ABNORMAL LOW (ref 0.30–0.70)
Heparin Unfractionated: 0.18 IU/mL — ABNORMAL LOW (ref 0.30–0.70)

## 2018-08-13 LAB — MAGNESIUM
Magnesium: 2.2 mg/dL (ref 1.7–2.4)
Magnesium: 2.1 mg/dL (ref 1.7–2.4)

## 2018-08-13 LAB — CALCIUM, IONIZED: Calcium, Ionized, Serum: 4.7 mg/dL (ref 4.5–5.6)

## 2018-08-13 LAB — APTT
aPTT: 65 seconds — ABNORMAL HIGH (ref 24–36)
aPTT: 66 seconds — ABNORMAL HIGH (ref 24–36)

## 2018-08-13 MED ORDER — HEPARIN (PORCINE) 25000 UT/250ML-% IV SOLN
2700.0000 [IU]/h | INTRAVENOUS | Status: DC
  Administered 2018-08-13: 2700 [IU]/h via INTRAVENOUS
  Administered 2018-08-13 (×2): 2500 [IU]/h via INTRAVENOUS
  Filled 2018-08-13 (×2): qty 250

## 2018-08-13 MED ORDER — AMIODARONE IV BOLUS ONLY 150 MG/100ML
150.0000 mg | Freq: Once | INTRAVENOUS | Status: AC
  Administered 2018-08-13: 150 mg via INTRAVENOUS
  Filled 2018-08-13: qty 100

## 2018-08-13 NOTE — Progress Notes (Signed)
ANTICOAGULATION CONSULT NOTE  Pharmacy Consult for IV heparin Indication: positive COVID patient on CRRT needs systemic anticoagulation, a-fib coverage   No Known Allergies  Patient Measurements: Height: 5\' 10"  (177.8 cm) Weight: 286 lb 9.6 oz (130 kg) IBW/kg (Calculated) : 73 Heparin Dosing Weight: 104 kg  Vital Signs: Temp: 97.3 F (36.3 C) (04/16 1300) Temp Source: Axillary (04/16 1300) BP: 148/66 (04/16 1534) Pulse Rate: 137 (04/16 1534)  Labs: Recent Labs    08/12/18 0330  08/12/18 1602  08/12/18 2024 08/12/18 2029  08/12/18 2342 08/12/18 2347 08/13/18 0420 08/13/18 1529  HGB 8.1*   < >  --    < >  --   --    < > 7.8* 10.2* 8.5*  --   HCT 25.6*   < >  --    < >  --   --    < > 23.0* 30.0* 26.4*  --   PLT 122*  --   --   --   --   --   --   --   --  137*  --   APTT 48*  --   --   --  63*  --   --   --   --  65* 66*  HEPARINUNFRC  --    < >  --   --   --  0.11*  --   --   --  0.15* 0.18*  CREATININE 4.74*  --  3.83*  --   --   --   --   --   --  3.37* 3.17*   < > = values in this interval not displayed.    Estimated Creatinine Clearance: 36.9 mL/min (A) (by C-G formula based on SCr of 3.17 mg/dL (H)).   Assessment: 52 y/oM to start heparin infusion per pharmacy. Positive COVID patient on CRRT needs systemic anticoagulation per CCM request. Also with a-fib. Patient previously on systemic heparin, but was stopped after patient developed hematochezia. However, dialysis catheter has continuously clotted off, neccesitating the restart of IV heparin.   4/16  PM heparin level = 0.18 and aptt = 66 sec, subtherapeutic on heparin infusion at 2500 units/hr  Hgb =8.5 and plts = 137 this am  RN reports IV heparin running through port on HD catheter, no bleeding per RN  Heparin level drawn through PICC line. No interruptions in therapy reported by RN.    Goal of Therapy:  Heparin level 0.3-0.7 units/ml Monitor platelets by anticoagulation protocol: Yes   Plan:    Increase heparin infusion to 2700 units/hr  Recheck HL and aptt in 8 hours  Daily CBC while on heparin infusion.   Monitor closely for signs and symptoms of bleeding  Dolly Rias RPh 08/13/2018, 4:31 PM Pager [phone]

## 2018-08-13 NOTE — Progress Notes (Signed)
NAME:  [name] Long, MRN:  [id number], DOB:  23-Aug-1965, LOS: 49 ADMISSION DATE:  08/25/2018, CONSULTATION DATE:  07/30/2018 REFERRING MD:  07/30/2018, CHIEF COMPLAINT:   Brief History   53 yo male presented to [facility] with several days of abdominal pain from diverticulitis with retained perforation.  Developed pneumonia with hypoxia from [place].  Transferred to [facility].  Past Medical History  DM2, HTN, Former smoker, OSA, A fib  [facility] Events   4/01 admission for diverticulitis 4/02 PCCM consulted for worsening dyspnea, hypoxemia 4/02 Proned  4/03 Proned  4/05 Improved oxygenation however ABG with respiratory and metabolic acidosis. Poor UOP 4/06 Supine; completed plaquenil 4/07 CVVHD, difficulty with filters clotting  4/08 CVVHD ongoing, AFwRVR afternoon of 4/8, started on amio gtt   4/12 hemodialysis catheter changed to right femoral location; brief episode of PEA resolved sponateously 08/11/2018 hemodialysis catheter and filter for CRRT machine or clotting off. 08/11/2018 started on heparin drip for atrial fibrillation in filter clotting on CRRT machine  Consults:  Renal GI signed off Surgery signed off Procedures:  ETT 4/2 >>  L IJ HD 4/6 >> 4/12 R Rad Aline 4/6 >>  4/12 right femoral HD catheter 24 cm>>  Significant Diagnostic Tests:  CT chest 4/01 [name]) >> b/l GGO in periphery CT abd/pelvis 4/01 [name]) >> perforated diverticulum lower descending colon, fatty liver with ?cirrhosis, splenomegaly 18.4 cm  Micro Data:  COVID 3/30 (PCP office) >> DETECTED Blood 4/12 >>  Antimicrobials:  Zosyn 4/1 >> 4/7 Zoysn 4/12 >>4/17  Interim history/subjective:  Weaning PSV 10/5 on relatively low dose fentanyl and versed infusions. Tolerating well. Pulling volumes of 600 with rate 16-18. RN reports he has been opening eyes to verbal and possibly following some very basic commands. Both of which are improvement. Continues CRRT pulling ~ 165mL/hr. Goal is to keep  negative if his BP will tolerate.   Objective   Blood pressure (!) 177/92, pulse (!) 133, temperature (!) 96.7 F (35.9 C), temperature source Axillary, resp. rate 18, height 5\' 10"  (1.778 m), weight 130 kg, SpO2 99 %. CVP:  [4 mmHg-18 mmHg] 10 mmHg  Vent Mode: CPAP;PSV FiO2 (%):  [40 %] 40 % Set Rate:  [30 bmp] 30 bmp Vt Set:  [510 mL] 510 mL PEEP:  [5 cmH20-8 cmH20] 5 cmH20 Pressure Support:  [5 cmH20-10 cmH20] 5 cmH20 Plateau Pressure:  [19 cmH20] 19 cmH20   Intake/Output Summary (Last 24 hours) at 08/13/2018 0942 Last data filed at 08/13/2018 0800 Gross per 24 hour  Intake 3260.22 ml  Output 5641 ml  Net -2380.78 ml   Filed Weights   08/11/18 0546 08/12/18 0500 08/13/18 0600  Weight: 134.4 kg (!) 136.7 kg 130 kg   Physical Exam: General:  Morbidly obese middle aged male.  Neuro:  RASS -2 to -3 HEENT:  [place]/AT, No JVD noted, PERRL. Dried secretions L nare.  Cardiovascular:  Tachy, regular, no MRG. HR 140s. Sinus Lungs:  Clear Abdomen:  Soft, non-distended, non-tender Musculoskeletal:  No acute deformity Skin:  Intact, MMM   Resolved Hospital Problem list   Mixed Acidosis   Assessment & Plan:   Acute hypoxic respiratory failure with ARDS from COVID 19 pneumonia. Hx of OSA. Discussion: Not candidate for tocilizumab due to diverticulitis. Plan Continue full vent support Weaning on PSV with moderate support. Tolerating well.  FiO2 down to 40% Intermittent chest x-rays Continue to draw negative with CRRT as tolerated.  Despite best weaning efforts, will likely need tracheostomy. Contact Dr. Nelda Marseille  Acute  metabolic encephalopathy. Plan RASS goal -1 to -2.  Continue fentanyl and versed infusions.   Perforated diverticulum Hematochezia Plan Continue Zosyn (Started 4/12 for hypotension and possible sepsis) low threshold to DC. Per primary Appreciate GI and surgical consults: he is not a surgical candidate.   TF trickle  AKI from ATN. Plan Continue CRRT per  renal Trouble with filter clotting improved with heparin Right femoral HD catheter appears to be functioning properly  DM type II Plan Per primary  Paroxysmal A fib (present prior to admission) Plan Per primary: Amiodarone, heparin   Best practice:  Diet: NPO, tube feedings trickle DVT prophylaxis: SCDs GI prophylaxis: pantoprazole Mobility: bed rest Code Status: No CPR, defibrillation  Family Communication: Wife updated. OK with trach.  Disposition: ICU  Labs    CMP Latest Ref Rng & Units 08/13/2018 08/12/2018 08/12/2018  Glucose 70 - 99 mg/dL 161(H) - -  BUN 6 - 20 mg/dL 51(H) - -  Creatinine 0.61 - 1.24 mg/dL 3.37(H) - -  Sodium 135 - 145 mmol/L 136 138 134(L)  Potassium 3.5 - 5.1 mmol/L 3.9 3.9 4.2  Chloride 98 - 111 mmol/L 94(L) - -  CO2 22 - 32 mmol/L 29 - -  Calcium 8.9 - 10.3 mg/dL 9.2 - -  Total Protein 6.5 - 8.1 g/dL - - -  Total Bilirubin 0.3 - 1.2 mg/dL - - -  Alkaline Phos 38 - 126 U/L - - -  AST 15 - 41 U/L - - -  ALT 0 - 44 U/L - - -   CBC Latest Ref Rng & Units 08/13/2018 08/12/2018 08/12/2018  WBC 4.0 - 10.5 K/uL 2.9(L) - -  Hemoglobin 13.0 - 17.0 g/dL 8.5(L) 10.2(L) 7.8(L)  Hematocrit 39.0 - 52.0 % 26.4(L) 30.0(L) 23.0(L)  Platelets 150 - 400 K/uL 137(L) - -   ABG    Component Value Date/Time   PHART 7.482 (H) 08/13/2018 0420   PCO2ART 42.5 08/13/2018 0420   PO2ART 130 (H) 08/13/2018 0420   HCO3 31.8 (H) 08/13/2018 0420   TCO2 32 08/12/2018 2347   ACIDBASEDEF 2.0 08/10/2018 1554   O2SAT 98.8 08/13/2018 0420   CBG (last 3)  Recent Labs    08/12/18 2340 08/13/18 0413 08/13/18 0751  GLUCAP 163* 148* 145*    CC TIME: 45 mins  Georgann Housekeeper, AGACNP-BC [place] Pager [phone] or [phone]  08/13/2018 10:33 AM

## 2018-08-13 NOTE — Progress Notes (Signed)
Placed Pt on full support a 1534. Pt's BP an HR increased. RT will continue to monitor

## 2018-08-13 NOTE — Progress Notes (Signed)
PROGRESS NOTE    [name] Long  WEX:[id number] DOB: 04-29-1966 DOA: 08/11/2018 PCP: Patient, No Pcp Per    Brief Narrative:  53 year old gentleman with a history of type 2 diabetes, hypertension, obstructive sleep apnea on CPAP who remains in the hospital for last 2 weeks.  He was initially admitted to [facility] emergency room with many days of abdominal pain and recurrent fevers.  He had acute sigmoid diverticulitis with localized peritonitis and perforation along with COVID-19 pneumonia.  Initially he was noted to have mild hypoxemia on admission, he subsequently deteriorated needing mechanical intubation and ventilation.  He has been suffering from multiple problems since then.  Remains critically ill, now on vent, CRRT.  Assessment & Plan:   Principal Problem:   Acute respiratory distress syndrome (ARDS) due to COVID-19 virus Active Problems:   COVID-19 virus infection   Diverticulitis of colon with perforation   HTN (hypertension)   DM2 (diabetes mellitus, type 2) (HCC)   Neutropenia (HCC)   Thrombocytopenia (HCC)   Pressure injury of skin  Acute respiratory distress syndrome due to COVID-19 infection: Patient remains on mechanical ventilation with difficult to wean from the ventilator.  Likely needing tracheostomy for ongoing ventilator support because of underlying morbid obesity and sleep apnea.  Followed by critical care. Continue current mechanical ventilation as per critical care. He was not a candidate for Tocilizumab because of acute diverticulitis.  Acute kidney injury: Followed by nephrology.  Currently remains on CRRT.  Perforated sigmoid diverticulum: Localized diverticulum.  Patient was followed by GI and surgery.  Signed off.  Patient does have bowel function.  Tolerated trickle feeding. He had a bowel movement. Hopefully we can advance diet and monitor. Patient remains on Zosyn.  paroxysmal atrial fibrillation: Heart rate control is better today. Patient is on  amiodarone drip.  He is also on heparin drip.  Type 2 diabetes: Remains on insulin and well controlled.   DVT prophylaxis: Patient on heparin drip. Code Status: No CPR.  Okay for defibrillation. Family Communication: wife. Disposition Plan: ICU   Consultants:   PCCM  Surgery, signed off  GI , signed off  Renal , CRRT  Procedures:  ETT 4/2 >>  L IJ HD 4/6 >> 4/12 R Rad Aline 4/6 >>  4/12 right femoral HD catheter 24 cm>>  Antimicrobials:   Zosyn 4/1-4/7  Zosyn 4/12--- ongoing    Subjective: Patient seen and examined. He is sedated.  No overnight events.  Heart rate control remains a problem. He is getting CRRT.    Objective: Vitals:   08/13/18 0800 08/13/18 0817 08/13/18 0900 08/13/18 1000  BP:  (!) 177/92    Pulse: (!) 131 (!) 133 (!) 147 (!) 131  Resp: (!) 0 18 (!) 23 15  Temp:      TempSrc:      SpO2: 100% 99% 95% 97%  Weight:      Height:        Intake/Output Summary (Last 24 hours) at 08/13/2018 1042 Last data filed at 08/13/2018 1013 Gross per 24 hour  Intake 3547.6 ml  Output 5893 ml  Net -2345.4 ml   Filed Weights   08/11/18 0546 08/12/18 0500 08/13/18 0600  Weight: 134.4 kg (!) 136.7 kg 130 kg    Examination:  General: Morbidly obese male who is sedated on full mechanical ventilatory support.  HEENT: Pupils equal reactive.  He does have some abrasions around his nares.  No JVD or lymphadenopathy is appreciated Neuro: Opens eyes to stimuli. He was trying to  track. CV: Heart sounds irregular irregular with poorly controlled ventricular rate of 130s in the setting of atrial fibrillation PULM: Coarse rhonchi bilaterally, decreased breath sounds in the bases GI: Abdomen is obese and pendulous.  Patient is receiving 10 mL/h of tube feeding and tolerating well. Bowel sounds very sluggish.  No rigidity or guarding. Extremities: Warm and dry, 2+ edema, Skin: Warm to touch    Data Reviewed: I have personally reviewed following labs and imaging  studies  CBC: Recent Labs  Lab 08/08/18 1103  08/09/18 0327  08/10/18 0220  08/12/18 0330  08/12/18 2153 08/12/18 2159 08/12/18 2342 08/12/18 2347 08/13/18 0420  WBC 11.1*  --  7.8  --  6.0  --  3.1*  --   --   --   --   --  2.9*  NEUTROABS  --   --   --   --  4.9  --   --   --   --   --   --   --  1.8  HGB 10.8*   < > 10.4*   < > 10.0*   < > 8.1*   < > 9.9* 7.8* 7.8* 10.2* 8.5*  HCT 33.1*   < > 32.7*   < > 31.1*   < > 25.6*   < > 29.0* 23.0* 23.0* 30.0* 26.4*  MCV 95.4  --  94.5  --  93.7  --  91.8  --   --   --   --   --  92.0  PLT 190  --  197  --  133*  --  122*  --   --   --   --   --  137*   < > = values in this interval not displayed.   Basic Metabolic Panel: Recent Labs  Lab 08/09/18 0327  08/10/18 0220  08/11/18 0407 08/11/18 0408 08/11/18 1520  08/12/18 0330  08/12/18 1602  08/12/18 2153 08/12/18 2159 08/12/18 2342 08/12/18 2347 08/13/18 0420  NA 138   < > 133*  134*   < >  --  134* 138   < > 134*   < > 136   < > 137 134* 134* 138 136  K 4.3   < > 3.9  3.9   < >  --  4.6 4.4   < > 4.8   < > 4.5   < > 4.1 4.3 4.2 3.9 3.9  CL 103   < > 99  99   < >  --  96* 107  --  96*  --  97*  --   --   --   --   --  94*  CO2 20*   < > 21*  20*   < >  --  25 21*  --  25  --  27  --   --   --   --   --  29  GLUCOSE 164*   < > 165*  164*   < >  --  165* 127*  --  134*  --  173*  --   --   --   --   --  161*  BUN 98*   < > 83*  84*   < >  --  78* 80*  --  77*  --  60*  --   --   --   --   --  51*  CREATININE 5.17*   < > 4.06*  4.10*   < >  --  3.91* 4.15*  --  4.74*  --  3.83*  --   --   --   --   --  3.37*  CALCIUM 8.1*   < > 8.7*  8.6*   < >  --  8.7* 6.8*  --  8.6*  --  8.7*  --   --   --   --   --  9.2  MG 2.7*  --  2.2  --  2.3  --   --   --  2.4  --   --   --   --   --   --   --  2.2  PHOS 6.2*   < > 4.6  4.7*   < >  --  5.0* 5.7*  --  5.8*  --  4.5  --   --   --   --   --  3.1   < > = values in this interval not displayed.   GFR: Estimated Creatinine  Clearance: 34.7 mL/min (A) (by C-G formula based on SCr of 3.37 mg/dL (H)). Liver Function Tests: Recent Labs  Lab 08/08/18 0420  08/10/18 1617 08/11/18 0408 08/11/18 1520 08/12/18 1602 08/13/18 0420  AST 61*  --   --   --   --   --   --   ALT 34  --   --   --   --   --   --   ALKPHOS 82  --   --   --   --   --   --   BILITOT 2.0*  --   --   --   --   --   --   PROT 5.5*  --   --   --   --   --   --   ALBUMIN 2.0*   < > 1.9* 2.2* 1.7* 2.0* 2.1*   < > = values in this interval not displayed.   No results for input(s): LIPASE, AMYLASE in the last 168 hours. No results for input(s): AMMONIA in the last 168 hours. Coagulation Profile: No results for input(s): INR, PROTIME in the last 168 hours. Cardiac Enzymes: No results for input(s): CKTOTAL, CKMB, CKMBINDEX, TROPONINI in the last 168 hours. BNP (last 3 results) No results for input(s): PROBNP in the last 8760 hours. HbA1C: No results for input(s): HGBA1C in the last 72 hours. CBG: Recent Labs  Lab 08/12/18 1558 08/12/18 2028 08/12/18 2340 08/13/18 0413 08/13/18 0751  GLUCAP 172* 152* 163* 148* 145*   Lipid Profile: No results for input(s): CHOL, HDL, LDLCALC, TRIG, CHOLHDL, LDLDIRECT in the last 72 hours. Thyroid Function Tests: No results for input(s): TSH, T4TOTAL, FREET4, T3FREE, THYROIDAB in the last 72 hours. Anemia Panel: No results for input(s): VITAMINB12, FOLATE, FERRITIN, TIBC, IRON, RETICCTPCT in the last 72 hours. Sepsis Labs: Recent Labs  Lab 08/08/18 1102 08/08/18 1402  LATICACIDVEN 1.0 1.0    Recent Results (from the past 240 hour(s))  Culture, blood (routine x 2)     Status: None (Preliminary result)   Collection Time: 08/09/18  3:46 PM  Result Value Ref Range Status   Specimen Description   Final    BLOOD RIGHT HAND Performed at [facility] [address] [place], [place] [address]    Special Requests   Final    BOTTLES DRAWN AEROBIC ONLY Blood Culture adequate volume  Performed at [facility] [address] [place], [place] [address]    Culture  Final    NO GROWTH 3 DAYS Performed at [facility], [place] [address] [place],  [address]    Report Status PENDING  Incomplete         Radiology Studies: Dg Chest Port 1 View  Result Date: 08/12/2018 CLINICAL DATA:  Acute respiratory failure. Endotracheally intubated. COVID-19. EXAM: PORTABLE CHEST 1 VIEW COMPARISON:  08/10/2018 FINDINGS: Endotracheal tube and nasogastric tube remain in appropriate position. Stable cardiomegaly. There has been interval improvement in mild bibasilar infiltrates since previous study. No evidence of pleural effusion. IMPRESSION: 1. Interval improvement in mild bibasilar infiltrates. 2. Stable cardiomegaly. Electronically Signed   By: Earle Gell M.D.   On: 08/12/2018 07:35        Scheduled Meds: . chlorhexidine gluconate (MEDLINE KIT)  15 mL Mouth Rinse BID  . Chlorhexidine Gluconate Cloth  6 each Topical Daily  . feeding supplement (PRO-STAT SUGAR FREE 64)  60 mL Per Tube TID  . feeding supplement (VITAL HIGH PROTEIN)  1,000 mL Per Tube Q24H  . insulin aspart  0-9 Units Subcutaneous Q4H  . mouth rinse  15 mL Mouth Rinse 10 times per day  . pantoprazole (PROTONIX) IV  40 mg Intravenous Q24H  . sodium chloride flush  10-40 mL Intracatheter Q12H   Continuous Infusions: .  prismasol BGK 4/2.5 500 mL/hr at 08/13/18 0244  . sodium chloride Stopped (08/10/18 0653)  . amiodarone 30 mg/hr (08/13/18 1000)  . anticoagulant sodium citrate    . calcium gluconate infusion for CRRT 60 mL/hr at 08/13/18 1000  . fentaNYL infusion INTRAVENOUS 125 mcg/hr (08/13/18 1000)  . heparin 2,500 Units/hr (08/13/18 1000)  . midazolam 2 mg/hr (08/13/18 1000)  . piperacillin-tazobactam (ZOSYN)  IV Stopped (08/13/18 0606)  . prismasol BGK 4/2.5 2,000 mL/hr at 08/13/18 1010  . sodium citrate 2 %/dextrose 2.5% solution 3000 mL 390 mL/hr at 08/13/18 0240      LOS: 15 days    Time spent: 25 minutes    Barb Merino, MD [facility] Pager [phone]  If 7PM-7AM, please contact night-coverage [email or web address] Password [facility] 08/13/2018, 10:42 AM

## 2018-08-13 NOTE — Progress Notes (Signed)
eLink Physician-Brief Progress Note Patient Name: [name] Long DOB: [birth year] MRN: [id number]   Date of Service  08/13/2018  HPI/Events of Note  AFIB with RVR - Ventricular rate = 143. Currently on an Amiodarone IV infusion. Last K+ = 3.9.   eICU Interventions  Will order: 1. Bolus with Amiodarone 150 mg IV over 10 minutes now.  2. Mg++ level STAT.      Intervention Category Major Interventions: Arrhythmia - evaluation and management  [name] Long 08/13/2018, 10:00 PM

## 2018-08-13 NOTE — Progress Notes (Signed)
ANTICOAGULATION CONSULT NOTE  Pharmacy Consult for IV heparin Indication: positive COVID patient on CRRT needs systemic anticoagulation, a-fib coverage   No Known Allergies  Patient Measurements: Height: 5\' 10"  (177.8 cm) Weight: (!) 301 lb 5.9 oz (136.7 kg) IBW/kg (Calculated) : 73 Heparin Dosing Weight: 104 kg  Vital Signs: Temp: 98.6 F (37 C) (04/16 0400) BP: 151/80 (04/16 0322) Pulse Rate: 134 (04/16 0600)  Labs: Recent Labs    08/12/18 0330  08/12/18 0944  08/12/18 1602 08/12/18 1753 08/12/18 1802 08/12/18 2024 08/12/18 2029 08/13/18 0420  HGB 8.1*   < >  --    < >  --  10.5* 7.8*  --   --  8.5*  HCT 25.6*   < >  --    < >  --  31.0* 23.0*  --   --  26.4*  PLT 122*  --   --   --   --   --   --   --   --  137*  APTT 48*  --   --   --   --   --   --  63*  --  65*  HEPARINUNFRC  --   --  <0.10*  --   --   --   --   --  0.11* 0.15*  CREATININE 4.74*  --   --   --  3.83*  --   --   --   --  3.37*   < > = values in this interval not displayed.    Estimated Creatinine Clearance: 35.7 mL/min (A) (by C-G formula based on SCr of 3.37 mg/dL (H)).   Assessment: 52 y/oM to start heparin infusion per pharmacy. Positive COVID patient on CRRT needs systemic anticoagulation per CCM request. Also with a-fib. Patient previously on systemic heparin, but was stopped after patient developed hematochezia. However, dialysis catheter has continuously clotted off, neccesitating the restart of IV heparin.   4/16  AM heparin level = 0.15 and aptt = 65 sec, subtherapeutic on heparin infusion at 2300 units/hr  Hgb =8.5 and plts = 137  RN reports IV heparin running through port on HD catheter  Heparin level drawn through PICC line. No interruptions in therapy reported by RN.    Goal of Therapy:  Heparin level 0.3-0.7 units/ml Monitor platelets by anticoagulation protocol: Yes   Plan:   Increase heparin infusion to 2500 units/hr  Recheck HL and aptt in 8 hours  Daily CBC  while on heparin infusion.   Monitor closely for signs and symptoms of bleeding  Dorrene German 08/12/2018 11:36 AM

## 2018-08-13 NOTE — Progress Notes (Signed)
[facility] ROUNDING NOTE   Subjective:   This is a 53 year old gentleman diabetes hypertension obstructive sleep apnea on CPAP.  Admitted with  COVID-19.  He has been treated by CCM with Plaquenil azithromycin.  CRRT was initiated 08/02/2018.  Has had some clotting of the filter and required initiation of heparin with dialysis 08/03/2018. Then still clotting was changed to citrate anticoagulation.  He has been kept even.  Has been placed back on pressors.  Still not making great progress.   Daily Update: HD cath was clogged and TPA'd twice on 4/14 then was started on systemic IV heparin as well as local citrate anticoag and the HD cath has been working well since.  Today pt has made significant improvements and is off of pressors w/ BP's in the 140's and we are pulling volume again w/ CRRT 100- 150 cc/hr net negative. CVP's are steady at 60.     Objective:  Vital signs in last 24 hours:  Temp:  [96.7 F (35.9 C)-98.6 F (37 C)] 97.3 F (36.3 C) (04/16 1300) Pulse Rate:  [122-147] 137 (04/16 1534) Resp:  [0-30] 30 (04/16 1534) BP: (148-177)/(66-92) 148/66 (04/16 1534) SpO2:  [95 %-100 %] 99 % (04/16 1534) Arterial Line BP: (142-181)/(62-113) 143/62 (04/16 1500) FiO2 (%):  [40 %] 40 % (04/16 1534) Weight:  [130 kg] 130 kg (04/16 0600)  Weight change: -6.7 kg Filed Weights   08/11/18 0546 08/12/18 0500 08/13/18 0600  Weight: 134.4 kg (!) 136.7 kg 130 kg    Intake/Output: I/O last 3 completed shifts: In: 4649.9 [P.O.:30; I.V.:4106; Other:60; NG/GT:182.5; IV Piggyback:271.4] Out: 7904 [Emesis/NG output:500; KZSWF:0932]   Intake/Output this shift:  Total I/O In: 1289.6 [P.O.:130; I.V.:1018.6; NG/GT:80; IV Piggyback:61] Out: 2074 [Other:2074]  Patient not physically examined in room due to patient being + for covid 19. Exam findings derived from RN, staff assessments and primary team assessments.   Assessment/ Plan:   Acute renal failure: Suspected ATN possibly  contrast related.  Worsening respiratory acidosis and metabolic acidosis >> CRRT initiated 08/02/18. D#12 CRRT today. Temp HD cath function much better on IV heparin systemic + local citrate anticoag. Hemodynamics improved now , off pressors and BP's on the high side, pulling fluid today -100 to 150 cc/ hr. Continue.    Pneumonia/ COVID-19/ VDRF:  on vent, 40% FiO2. Weaning.   Afib w/ RVR  Diabetes mellitus per primary team  Obesity and obstructive sleep apnea  History of atrial fibrillation: on IV amiodarone  Electrolytes appear to be stable  Acid-base: resp / metabolic acidodis , controlled at this time  Volume: CXR better.  Under admit weight now.       LOS: 12 [name] '@TODAY' '@8' :03 AM  Basic Metabolic Panel: Recent Labs  Lab 08/09/18 0327  08/10/18 0220  08/11/18 0407 08/11/18 0408 08/11/18 1520  08/12/18 0330  08/12/18 1602  08/12/18 2153 08/12/18 2159 08/12/18 2342 08/12/18 2347 08/13/18 0420  NA 138   < > 133*  134*   < >  --  134* 138   < > 134*   < > 136   < > 137 134* 134* 138 136  K 4.3   < > 3.9  3.9   < >  --  4.6 4.4   < > 4.8   < > 4.5   < > 4.1 4.3 4.2 3.9 3.9  CL 103   < > 99  99   < >  --  96* 107  --  96*  --  97*  --   --   --   --   --  94*  CO2 20*   < > 21*  20*   < >  --  25 21*  --  25  --  27  --   --   --   --   --  29  GLUCOSE 164*   < > 165*  164*   < >  --  165* 127*  --  134*  --  173*  --   --   --   --   --  161*  BUN 98*   < > 83*  84*   < >  --  78* 80*  --  77*  --  60*  --   --   --   --   --  51*  CREATININE 5.17*   < > 4.06*  4.10*   < >  --  3.91* 4.15*  --  4.74*  --  3.83*  --   --   --   --   --  3.37*  CALCIUM 8.1*   < > 8.7*  8.6*   < >  --  8.7* 6.8*  --  8.6*  --  8.7*  --   --   --   --   --  9.2  MG 2.7*  --  2.2  --  2.3  --   --   --  2.4  --   --   --   --   --   --   --  2.2  PHOS 6.2*   < > 4.6  4.7*   < >  --  5.0* 5.7*  --  5.8*  --  4.5  --   --   --   --   --  3.1   < > = values in this interval  not displayed.    Liver Function Tests: Recent Labs  Lab 08/08/18 0420  08/10/18 1617 08/11/18 0408 08/11/18 1520 08/12/18 1602 08/13/18 0420  AST 61*  --   --   --   --   --   --   ALT 34  --   --   --   --   --   --   ALKPHOS 82  --   --   --   --   --   --   BILITOT 2.0*  --   --   --   --   --   --   PROT 5.5*  --   --   --   --   --   --   ALBUMIN 2.0*   < > 1.9* 2.2* 1.7* 2.0* 2.1*   < > = values in this interval not displayed.   No results for input(s): LIPASE, AMYLASE in the last 168 hours. No results for input(s): AMMONIA in the last 168 hours.  CBC: Recent Labs  Lab 08/08/18 1103  08/09/18 0327  08/10/18 0220  08/12/18 0330  08/12/18 2153 08/12/18 2159 08/12/18 2342 08/12/18 2347 08/13/18 0420  WBC 11.1*  --  7.8  --  6.0  --  3.1*  --   --   --   --   --  2.9*  NEUTROABS  --   --   --   --  4.9  --   --   --   --   --   --   --  1.8  HGB 10.8*   < > 10.4*   < > 10.0*   < > 8.1*   < > 9.9* 7.8* 7.8* 10.2* 8.5*  HCT 33.1*   < > 32.7*   < > 31.1*   < > 25.6*   < > 29.0* 23.0* 23.0* 30.0* 26.4*  MCV 95.4  --  94.5  --  93.7  --  91.8  --   --   --   --   --  92.0  PLT 190  --  197  --  133*  --  122*  --   --   --   --   --  137*   < > = values in this interval not displayed.    Cardiac Enzymes: No results for input(s): CKTOTAL, CKMB, CKMBINDEX, TROPONINI in the last 168 hours.  BNP: Invalid input(s): POCBNP  CBG: Recent Labs  Lab 08/12/18 2340 08/13/18 0413 08/13/18 0751 08/13/18 1242 08/13/18 1537  GLUCAP 163* 148* 145* 175* 159*    Microbiology: Results for orders placed or performed during the hospital encounter of 08/06/2018  MRSA PCR Screening     Status: None   Collection Time: 07/30/18  2:30 AM  Result Value Ref Range Status   MRSA by PCR NEGATIVE NEGATIVE Final    Comment:        The GeneXpert MRSA Assay (FDA approved for NASAL specimens only), is one component of a comprehensive MRSA colonization surveillance program. It is  not intended to diagnose MRSA infection nor to guide or monitor treatment for MRSA infections. Performed at [facility], [place] [address], [place], [place] [address]   Culture, blood (routine x 2)     Status: None (Preliminary result)   Collection Time: 08/09/18  3:46 PM  Result Value Ref Range Status   Specimen Description   Final    BLOOD RIGHT HAND Performed at [facility] [address] [place], [place] [address]    Special Requests   Final    BOTTLES DRAWN AEROBIC ONLY Blood Culture adequate volume Performed at [facility] [address], [place], [place] [address]    Culture   Final    NO GROWTH 4 DAYS Performed at [facility], [place] [address], [place],  [address]    Report Status PENDING  Incomplete    Coagulation Studies: No results for input(s): LABPROT, INR in the last 72 hours.  Urinalysis: No results for input(s): COLORURINE, LABSPEC, PHURINE, GLUCOSEU, HGBUR, BILIRUBINUR, KETONESUR, PROTEINUR, UROBILINOGEN, NITRITE, LEUKOCYTESUR in the last 72 hours.  Invalid input(s): APPERANCEUR    Imaging: Dg Chest Port 1 View  Result Date: 08/12/2018 CLINICAL DATA:  Acute respiratory failure. Endotracheally intubated. COVID-19. EXAM: PORTABLE CHEST 1 VIEW COMPARISON:  08/10/2018 FINDINGS: Endotracheal tube and nasogastric tube remain in appropriate position. Stable cardiomegaly. There has been interval improvement in mild bibasilar infiltrates since previous study. No evidence of pleural effusion. IMPRESSION: 1. Interval improvement in mild bibasilar infiltrates. 2. Stable cardiomegaly. Electronically Signed   By: Earle Gell M.D.   On: 08/12/2018 07:35     Medications:   .  prismasol BGK 4/2.5 500 mL/hr at 08/13/18 1305  . sodium chloride Stopped (08/10/18 0653)  . amiodarone 30 mg/hr (08/13/18 1500)  . anticoagulant sodium citrate    . calcium gluconate infusion for CRRT 60 mL/hr at 08/13/18  1500  . fentaNYL infusion INTRAVENOUS 125 mcg/hr (08/13/18 1500)  . heparin 2,500 Units/hr (08/13/18 1500)  . midazolam  2 mg/hr (08/13/18 1500)  . piperacillin-tazobactam (ZOSYN)  IV Stopped (08/13/18 1228)  . prismasol BGK 4/2.5 2,000 mL/hr at 08/13/18 1522  . sodium citrate 2 %/dextrose 2.5% solution 3000 mL 250 mL/hr at 08/13/18 1047   . chlorhexidine gluconate (MEDLINE KIT)  15 mL Mouth Rinse BID  . Chlorhexidine Gluconate Cloth  6 each Topical Daily  . feeding supplement (PRO-STAT SUGAR FREE 64)  60 mL Per Tube TID  . feeding supplement (VITAL HIGH PROTEIN)  1,000 mL Per Tube Q24H  . insulin aspart  0-9 Units Subcutaneous Q4H  . mouth rinse  15 mL Mouth Rinse 10 times per day  . pantoprazole (PROTONIX) IV  40 mg Intravenous Q24H  . sodium chloride flush  10-40 mL Intracatheter Q12H   Place/Maintain arterial line **AND** sodium chloride, acetaminophen (TYLENOL) oral liquid 160 mg/5 mL, albuterol, anticoagulant sodium citrate, fentaNYL, hydrALAZINE, lip balm, midazolam, midazolam, [DISCONTINUED] ondansetron **OR** ondansetron (ZOFRAN) IV, sodium chloride flush

## 2018-08-14 LAB — POCT I-STAT EG7
Acid-Base Excess: 11 mmol/L — ABNORMAL HIGH (ref 0.0–2.0)
Acid-Base Excess: 4 mmol/L — ABNORMAL HIGH (ref 0.0–2.0)
Acid-Base Excess: 4 mmol/L — ABNORMAL HIGH (ref 0.0–2.0)
Bicarbonate: 31.7 mmol/L — ABNORMAL HIGH (ref 20.0–28.0)
Bicarbonate: 35.8 mmol/L — ABNORMAL HIGH (ref 20.0–28.0)
Bicarbonate: 31.3 mmol/L — ABNORMAL HIGH (ref 20.0–28.0)
Calcium, Ion: 0.36 mmol/L — CL (ref 1.15–1.40)
Calcium, Ion: 1.17 mmol/L (ref 1.15–1.40)
Calcium, Ion: 0.32 mmol/L — CL (ref 1.15–1.40)
HCT: 27 % — ABNORMAL LOW (ref 39.0–52.0)
HCT: 34 % — ABNORMAL LOW (ref 39.0–52.0)
HCT: 31 % — ABNORMAL LOW (ref 39.0–52.0)
Hemoglobin: 11.6 g/dL — ABNORMAL LOW (ref 13.0–17.0)
Hemoglobin: 9.2 g/dL — ABNORMAL LOW (ref 13.0–17.0)
Hemoglobin: 10.5 g/dL — ABNORMAL LOW (ref 13.0–17.0)
O2 Saturation: 63 %
O2 Saturation: 99 %
O2 Saturation: 56 %
Patient temperature: 97
Patient temperature: 98
Patient temperature: 98.1
Potassium: 3.9 mmol/L (ref 3.5–5.1)
Potassium: 4.1 mmol/L (ref 3.5–5.1)
Potassium: 3.8 mmol/L (ref 3.5–5.1)
Sodium: 134 mmol/L — ABNORMAL LOW (ref 135–145)
Sodium: 137 mmol/L (ref 135–145)
Sodium: 137 mmol/L (ref 135–145)
TCO2: 33 mmol/L — ABNORMAL HIGH (ref 22–32)
TCO2: 37 mmol/L — ABNORMAL HIGH (ref 22–32)
TCO2: 33 mmol/L — ABNORMAL HIGH (ref 22–32)
pCO2, Ven: 49.4 mmHg (ref 44.0–60.0)
pCO2, Ven: 56.7 mmHg (ref 44.0–60.0)
pCO2, Ven: 60.4 mmHg — ABNORMAL HIGH (ref 44.0–60.0)
pH, Ven: 7.351 (ref 7.250–7.430)
pH, Ven: 7.467 — ABNORMAL HIGH (ref 7.250–7.430)
pH, Ven: 7.321 (ref 7.250–7.430)
pO2, Ven: 129 mmHg — ABNORMAL HIGH (ref 32.0–45.0)
pO2, Ven: 34 mmHg (ref 32.0–45.0)
pO2, Ven: 32 mmHg (ref 32.0–45.0)

## 2018-08-14 LAB — POCT I-STAT 7, (LYTES, BLD GAS, ICA,H+H)
Acid-Base Excess: 4 mmol/L — ABNORMAL HIGH (ref 0.0–2.0)
Acid-Base Excess: 8 mmol/L — ABNORMAL HIGH (ref 0.0–2.0)
Acid-Base Excess: 10 mmol/L — ABNORMAL HIGH (ref 0.0–2.0)
Acid-Base Excess: 5 mmol/L — ABNORMAL HIGH (ref 0.0–2.0)
Acid-Base Excess: 10 mmol/L — ABNORMAL HIGH (ref 0.0–2.0)
Acid-Base Excess: 5 mmol/L — ABNORMAL HIGH (ref 0.0–2.0)
Acid-Base Excess: 10 mmol/L — ABNORMAL HIGH (ref 0.0–2.0)
Acid-Base Excess: 5 mmol/L — ABNORMAL HIGH (ref 0.0–2.0)
Acid-Base Excess: 4 mmol/L — ABNORMAL HIGH (ref 0.0–2.0)
Acid-Base Excess: 9 mmol/L — ABNORMAL HIGH (ref 0.0–2.0)
Bicarbonate: 31.4 mmol/L — ABNORMAL HIGH (ref 20.0–28.0)
Bicarbonate: 33.8 mmol/L — ABNORMAL HIGH (ref 20.0–28.0)
Bicarbonate: 31.6 mmol/L — ABNORMAL HIGH (ref 20.0–28.0)
Bicarbonate: 34.8 mmol/L — ABNORMAL HIGH (ref 20.0–28.0)
Bicarbonate: 31.6 mmol/L — ABNORMAL HIGH (ref 20.0–28.0)
Bicarbonate: 35.3 mmol/L — ABNORMAL HIGH (ref 20.0–28.0)
Bicarbonate: 32.6 mmol/L — ABNORMAL HIGH (ref 20.0–28.0)
Bicarbonate: 35 mmol/L — ABNORMAL HIGH (ref 20.0–28.0)
Bicarbonate: 31.6 mmol/L — ABNORMAL HIGH (ref 20.0–28.0)
Bicarbonate: 35.2 mmol/L — ABNORMAL HIGH (ref 20.0–28.0)
Calcium, Ion: 0.46 mmol/L — CL (ref 1.15–1.40)
Calcium, Ion: 1.17 mmol/L (ref 1.15–1.40)
Calcium, Ion: 0.53 mmol/L — CL (ref 1.15–1.40)
Calcium, Ion: 1.22 mmol/L (ref 1.15–1.40)
Calcium, Ion: 0.32 mmol/L — CL (ref 1.15–1.40)
Calcium, Ion: 1.17 mmol/L (ref 1.15–1.40)
Calcium, Ion: 0.35 mmol/L — CL (ref 1.15–1.40)
Calcium, Ion: 1.16 mmol/L (ref 1.15–1.40)
Calcium, Ion: 0.36 mmol/L — CL (ref 1.15–1.40)
Calcium, Ion: 1.21 mmol/L (ref 1.15–1.40)
HCT: 32 % — ABNORMAL LOW (ref 39.0–52.0)
HCT: 35 % — ABNORMAL LOW (ref 39.0–52.0)
HCT: 25 % — ABNORMAL LOW (ref 39.0–52.0)
HCT: 28 % — ABNORMAL LOW (ref 39.0–52.0)
HCT: 31 % — ABNORMAL LOW (ref 39.0–52.0)
HCT: 32 % — ABNORMAL LOW (ref 39.0–52.0)
HCT: 30 % — ABNORMAL LOW (ref 39.0–52.0)
HCT: 31 % — ABNORMAL LOW (ref 39.0–52.0)
HCT: 24 % — ABNORMAL LOW (ref 39.0–52.0)
HCT: 30 % — ABNORMAL LOW (ref 39.0–52.0)
Hemoglobin: 10.9 g/dL — ABNORMAL LOW (ref 13.0–17.0)
Hemoglobin: 11.9 g/dL — ABNORMAL LOW (ref 13.0–17.0)
Hemoglobin: 8.5 g/dL — ABNORMAL LOW (ref 13.0–17.0)
Hemoglobin: 9.5 g/dL — ABNORMAL LOW (ref 13.0–17.0)
Hemoglobin: 10.5 g/dL — ABNORMAL LOW (ref 13.0–17.0)
Hemoglobin: 10.9 g/dL — ABNORMAL LOW (ref 13.0–17.0)
Hemoglobin: 10.2 g/dL — ABNORMAL LOW (ref 13.0–17.0)
Hemoglobin: 10.5 g/dL — ABNORMAL LOW (ref 13.0–17.0)
Hemoglobin: 10.2 g/dL — ABNORMAL LOW (ref 13.0–17.0)
Hemoglobin: 8.2 g/dL — ABNORMAL LOW (ref 13.0–17.0)
O2 Saturation: 71 %
O2 Saturation: 99 %
O2 Saturation: 71 %
O2 Saturation: 98 %
O2 Saturation: 66 %
O2 Saturation: 97 %
O2 Saturation: 60 %
O2 Saturation: 96 %
O2 Saturation: 68 %
O2 Saturation: 99 %
Patient temperature: 98.1
Potassium: 3.8 mmol/L (ref 3.5–5.1)
Potassium: 4 mmol/L (ref 3.5–5.1)
Potassium: 3.8 mmol/L (ref 3.5–5.1)
Potassium: 3.9 mmol/L (ref 3.5–5.1)
Potassium: 3.6 mmol/L (ref 3.5–5.1)
Potassium: 3.8 mmol/L (ref 3.5–5.1)
Potassium: 3.8 mmol/L (ref 3.5–5.1)
Potassium: 4 mmol/L (ref 3.5–5.1)
Potassium: 3.6 mmol/L (ref 3.5–5.1)
Potassium: 3.9 mmol/L (ref 3.5–5.1)
Sodium: 138 mmol/L (ref 135–145)
Sodium: 135 mmol/L (ref 135–145)
Sodium: 134 mmol/L — ABNORMAL LOW (ref 135–145)
Sodium: 138 mmol/L (ref 135–145)
Sodium: 135 mmol/L (ref 135–145)
Sodium: 137 mmol/L (ref 135–145)
Sodium: 135 mmol/L (ref 135–145)
Sodium: 137 mmol/L (ref 135–145)
Sodium: 135 mmol/L (ref 135–145)
Sodium: 138 mmol/L (ref 135–145)
TCO2: 33 mmol/L — ABNORMAL HIGH (ref 22–32)
TCO2: 35 mmol/L — ABNORMAL HIGH (ref 22–32)
TCO2: 33 mmol/L — ABNORMAL HIGH (ref 22–32)
TCO2: 36 mmol/L — ABNORMAL HIGH (ref 22–32)
TCO2: 33 mmol/L — ABNORMAL HIGH (ref 22–32)
TCO2: 37 mmol/L — ABNORMAL HIGH (ref 22–32)
TCO2: 34 mmol/L — ABNORMAL HIGH (ref 22–32)
TCO2: 37 mmol/L — ABNORMAL HIGH (ref 22–32)
TCO2: 34 mmol/L — ABNORMAL HIGH (ref 22–32)
TCO2: 37 mmol/L — ABNORMAL HIGH (ref 22–32)
pCO2 arterial: 57.8 mmHg — ABNORMAL HIGH (ref 32.0–48.0)
pCO2 arterial: 49.1 mmHg — ABNORMAL HIGH (ref 32.0–48.0)
pCO2 arterial: 46 mmHg (ref 32.0–48.0)
pCO2 arterial: 56 mmHg — ABNORMAL HIGH (ref 32.0–48.0)
pCO2 arterial: 48.3 mmHg — ABNORMAL HIGH (ref 32.0–48.0)
pCO2 arterial: 54.9 mmHg — ABNORMAL HIGH (ref 32.0–48.0)
pCO2 arterial: 51.3 mmHg — ABNORMAL HIGH (ref 32.0–48.0)
pCO2 arterial: 61.2 mmHg — ABNORMAL HIGH (ref 32.0–48.0)
pCO2 arterial: 56.8 mmHg — ABNORMAL HIGH (ref 32.0–48.0)
pCO2 arterial: 63 mmHg — ABNORMAL HIGH (ref 32.0–48.0)
pH, Arterial: 7.341 — ABNORMAL LOW (ref 7.350–7.450)
pH, Arterial: 7.446 (ref 7.350–7.450)
pH, Arterial: 7.36 (ref 7.350–7.450)
pH, Arterial: 7.487 — ABNORMAL HIGH (ref 7.350–7.450)
pH, Arterial: 7.368 (ref 7.350–7.450)
pH, Arterial: 7.472 — ABNORMAL HIGH (ref 7.350–7.450)
pH, Arterial: 7.335 — ABNORMAL LOW (ref 7.350–7.450)
pH, Arterial: 7.442 (ref 7.350–7.450)
pH, Arterial: 7.309 — ABNORMAL LOW (ref 7.350–7.450)
pH, Arterial: 7.4 (ref 7.350–7.450)
pO2, Arterial: 40 mmHg — CL (ref 83.0–108.0)
pO2, Arterial: 114 mmHg — ABNORMAL HIGH (ref 83.0–108.0)
pO2, Arterial: 40 mmHg — CL (ref 83.0–108.0)
pO2, Arterial: 95 mmHg (ref 83.0–108.0)
pO2, Arterial: 36 mmHg — CL (ref 83.0–108.0)
pO2, Arterial: 90 mmHg (ref 83.0–108.0)
pO2, Arterial: 34 mmHg — CL (ref 83.0–108.0)
pO2, Arterial: 80 mmHg — ABNORMAL LOW (ref 83.0–108.0)
pO2, Arterial: 149 mmHg — ABNORMAL HIGH (ref 83.0–108.0)
pO2, Arterial: 40 mmHg — CL (ref 83.0–108.0)

## 2018-08-14 LAB — RENAL FUNCTION PANEL
Albumin: 2.5 g/dL — ABNORMAL LOW (ref 3.5–5.0)
Albumin: 2.5 g/dL — ABNORMAL LOW (ref 3.5–5.0)
Anion gap: 15 (ref 5–15)
Anion gap: 13 (ref 5–15)
BUN: 42 mg/dL — ABNORMAL HIGH (ref 6–20)
BUN: 41 mg/dL — ABNORMAL HIGH (ref 6–20)
CO2: 29 mmol/L (ref 22–32)
CO2: 31 mmol/L (ref 22–32)
Calcium: 9.6 mg/dL (ref 8.9–10.3)
Calcium: 9.5 mg/dL (ref 8.9–10.3)
Chloride: 92 mmol/L — ABNORMAL LOW (ref 98–111)
Chloride: 92 mmol/L — ABNORMAL LOW (ref 98–111)
Creatinine, Ser: 3.14 mg/dL — ABNORMAL HIGH (ref 0.61–1.24)
Creatinine, Ser: 2.97 mg/dL — ABNORMAL HIGH (ref 0.61–1.24)
GFR calc Af Amer: 25 mL/min — ABNORMAL LOW (ref >60)
GFR calc Af Amer: 27 mL/min — ABNORMAL LOW (ref >60)
GFR calc non Af Amer: 22 mL/min — ABNORMAL LOW (ref >60)
GFR calc non Af Amer: 23 mL/min — ABNORMAL LOW (ref >60)
Glucose, Bld: 184 mg/dL — ABNORMAL HIGH (ref 70–99)
Glucose, Bld: 187 mg/dL — ABNORMAL HIGH (ref 70–99)
Phosphorus: 3.4 mg/dL (ref 2.5–4.6)
Phosphorus: 3.5 mg/dL (ref 2.5–4.6)
Potassium: 3.9 mmol/L (ref 3.5–5.1)
Potassium: 3.9 mmol/L (ref 3.5–5.1)
Sodium: 136 mmol/L (ref 135–145)
Sodium: 136 mmol/L (ref 135–145)

## 2018-08-14 LAB — HEPARIN LEVEL (UNFRACTIONATED)
Heparin Unfractionated: 0.17 IU/mL — ABNORMAL LOW (ref 0.30–0.70)
Heparin Unfractionated: 0.24 IU/mL — ABNORMAL LOW (ref 0.30–0.70)

## 2018-08-14 LAB — CBC
HCT: 28.3 % — ABNORMAL LOW (ref 39.0–52.0)
HCT: 27.5 % — ABNORMAL LOW (ref 39.0–52.0)
Hemoglobin: 8.8 g/dL — ABNORMAL LOW (ref 13.0–17.0)
Hemoglobin: 8.3 g/dL — ABNORMAL LOW (ref 13.0–17.0)
MCH: 28.9 pg (ref 26.0–34.0)
MCH: 28.1 pg (ref 26.0–34.0)
MCHC: 31.1 g/dL (ref 30.0–36.0)
MCHC: 30.2 g/dL (ref 30.0–36.0)
MCV: 92.8 fL (ref 80.0–100.0)
MCV: 93.2 fL (ref 80.0–100.0)
Platelets: 151 10*3/uL (ref 150–400)
Platelets: 150 10*3/uL (ref 150–400)
RBC: 3.05 MIL/uL — ABNORMAL LOW (ref 4.22–5.81)
RBC: 2.95 MIL/uL — ABNORMAL LOW (ref 4.22–5.81)
RDW: 19.2 % — ABNORMAL HIGH (ref 11.5–15.5)
RDW: 19.2 % — ABNORMAL HIGH (ref 11.5–15.5)
WBC: 2.5 10*3/uL — ABNORMAL LOW (ref 4.0–10.5)
WBC: 1.9 10*3/uL — ABNORMAL LOW (ref 4.0–10.5)
nRBC: 0 % (ref 0.0–0.2)
nRBC: 0 % (ref 0.0–0.2)

## 2018-08-14 LAB — BLOOD GAS, ARTERIAL
Acid-Base Excess: 9.5 mmol/L — ABNORMAL HIGH (ref 0.0–2.0)
Acid-Base Excess: 8.2 mmol/L — ABNORMAL HIGH (ref 0.0–2.0)
Bicarbonate: 34.7 mmol/L — ABNORMAL HIGH (ref 20.0–28.0)
Bicarbonate: 33.8 mmol/L — ABNORMAL HIGH (ref 20.0–28.0)
Drawn by: 295031
Drawn by: 295031
FIO2: 40
FIO2: 40
MECHVT: 510 mL
MECHVT: 510 mL
O2 Saturation: 95.9 %
O2 Saturation: 99.3 %
PEEP: 8 cmH2O
PEEP: 8 cmH2O
Patient temperature: 98.6
Patient temperature: 98.6
RATE: 16 resp/min
RATE: 16 resp/min
pCO2 arterial: 51.5 mmHg — ABNORMAL HIGH (ref 32.0–48.0)
pCO2 arterial: 56 mmHg — ABNORMAL HIGH (ref 32.0–48.0)
pH, Arterial: 7.443 (ref 7.350–7.450)
pH, Arterial: 7.398 (ref 7.350–7.450)
pO2, Arterial: 80.6 mmHg — ABNORMAL LOW (ref 83.0–108.0)
pO2, Arterial: 164 mmHg — ABNORMAL HIGH (ref 83.0–108.0)

## 2018-08-14 LAB — CULTURE, BLOOD (ROUTINE X 2)
Culture: NO GROWTH
Special Requests: ADEQUATE

## 2018-08-14 LAB — APTT
aPTT: 62 seconds — ABNORMAL HIGH (ref 24–36)
aPTT: 79 seconds — ABNORMAL HIGH (ref 24–36)

## 2018-08-14 LAB — GLUCOSE, CAPILLARY
Glucose-Capillary: 160 mg/dL — ABNORMAL HIGH (ref 70–99)
Glucose-Capillary: 165 mg/dL — ABNORMAL HIGH (ref 70–99)
Glucose-Capillary: 177 mg/dL — ABNORMAL HIGH (ref 70–99)
Glucose-Capillary: 178 mg/dL — ABNORMAL HIGH (ref 70–99)
Glucose-Capillary: 182 mg/dL — ABNORMAL HIGH (ref 70–99)
Glucose-Capillary: 184 mg/dL — ABNORMAL HIGH (ref 70–99)

## 2018-08-14 LAB — MAGNESIUM: Magnesium: 2.3 mg/dL (ref 1.7–2.4)

## 2018-08-14 LAB — OCCULT BLOOD X 1 CARD TO LAB, STOOL: Fecal Occult Bld: POSITIVE — AB

## 2018-08-14 LAB — CALCIUM, IONIZED: Calcium, Ionized, Serum: 4.7 mg/dL (ref 4.5–5.6)

## 2018-08-14 MED ORDER — METOPROLOL TARTRATE 5 MG/5ML IV SOLN
5.0000 mg | Freq: Three times a day (TID) | INTRAVENOUS | Status: DC
  Administered 2018-08-14: 5 mg via INTRAVENOUS

## 2018-08-14 MED ORDER — DEXMEDETOMIDINE HCL IN NACL 400 MCG/100ML IV SOLN
0.4000 ug/kg/h | INTRAVENOUS | Status: DC
  Administered 2018-08-14 – 2018-08-15 (×2): 0.4 ug/kg/h via INTRAVENOUS
  Administered 2018-08-15: 14:00:00 0.7 ug/kg/h via INTRAVENOUS
  Administered 2018-08-16: 12:00:00 1.2 ug/kg/h via INTRAVENOUS
  Filled 2018-08-14 (×9): qty 100

## 2018-08-14 MED ORDER — HEPARIN (PORCINE) 25000 UT/250ML-% IV SOLN
2800.0000 [IU]/h | INTRAVENOUS | Status: DC
  Administered 2018-08-14 – 2018-08-15 (×3): 2700 [IU]/h via INTRAVENOUS
  Administered 2018-08-15: 2800 [IU]/h via INTRAVENOUS
  Administered 2018-08-15: 14:00:00 2700 [IU]/h via INTRAVENOUS
  Administered 2018-08-16: 07:00:00 2800 [IU]/h via INTRAVENOUS
  Filled 2018-08-14 (×6): qty 250

## 2018-08-14 MED ORDER — FENTANYL CITRATE (PF) 2500 MCG/50ML IJ SOLN
25.0000 ug/h | Status: DC
  Administered 2018-08-14 (×2): 150 ug/h via INTRAVENOUS
  Administered 2018-08-16: 350 ug/h via INTRAVENOUS
  Filled 2018-08-14 (×3): qty 100

## 2018-08-14 MED ORDER — NOREPINEPHRINE BITARTRATE 1 MG/ML IV SOLN
0.0000 ug/min | INTRAVENOUS | Status: DC
  Filled 2018-08-14: qty 4

## 2018-08-14 MED ORDER — METOPROLOL TARTRATE 25 MG/10 ML ORAL SUSPENSION
25.0000 mg | Freq: Two times a day (BID) | ORAL | Status: DC
  Filled 2018-08-14: qty 10

## 2018-08-14 MED ORDER — VITAL HIGH PROTEIN PO LIQD
1000.0000 mL | ORAL | Status: DC
  Administered 2018-08-14 – 2018-08-17 (×5): 1000 mL

## 2018-08-14 MED ORDER — DEXMEDETOMIDINE HCL IN NACL 400 MCG/100ML IV SOLN
INTRAVENOUS | Status: AC
  Administered 2018-08-14: 0.4 ug/kg/h via INTRAVENOUS
  Filled 2018-08-14: qty 100

## 2018-08-14 MED ORDER — METOPROLOL TARTRATE 5 MG/5ML IV SOLN
INTRAVENOUS | Status: AC
  Filled 2018-08-14: qty 5

## 2018-08-14 MED ORDER — NOREPINEPHRINE 16 MG/250ML-% IV SOLN
0.0000 ug/min | INTRAVENOUS | Status: DC
  Administered 2018-08-14: 5 ug/min via INTRAVENOUS
  Administered 2018-08-15: 21:00:00 2 ug/min via INTRAVENOUS
  Filled 2018-08-14 (×2): qty 250

## 2018-08-14 MED ORDER — HEPARIN (PORCINE) 25000 UT/250ML-% IV SOLN: 2900.0000 [IU]/h | INTRAVENOUS | Status: DC

## 2018-08-14 MED ORDER — METOPROLOL TARTRATE 25 MG/10 ML ORAL SUSPENSION
25.0000 mg | Freq: Two times a day (BID) | ORAL | Status: DC
  Administered 2018-08-14 – 2018-08-16 (×4): 25 mg via ORAL
  Filled 2018-08-14 (×4): qty 10

## 2018-08-14 NOTE — Progress Notes (Signed)
Nutrition Follow-up  RD working remotely.   DOCUMENTATION CODES:   Morbid obesity  INTERVENTION:  - increase Vital High Protein to 20 ml/hr at this time and then advance by 10 ml every 12 hours to reach goal rate of 65 ml/hr. - continue 60 ml prostat TID until TF at goal rate and then will decrease to 60 ml BID. - goal TF regimen: Vital High Protein @ 65 ml/hr with 60 ml prostat BID which will provide 1960 kcal, 196 grams protein, and 1304 ml free water. - free water flush, if desired, to be per MD/Nephrology. - recommend 500 mg ascorbic acid BID and 220 mg zinc or 1 tablet Ocuvite/day to aid in wound healing.    NUTRITION DIAGNOSIS:   Inadequate oral intake related to inability to eat as evidenced by NPO status. -ongoing  GOAL:   Provide needs based on ASPEN/SCCM guidelines -unmet at this time  MONITOR:   Vent status, TF tolerance, Labs, Weight trends, Skin, I & O's  ASSESSMENT:   53 year-old male admitted on 4/1 with abdominal pain which had been persistent for several days; noted to have diverticulitis at [facility] and was transferred to [facility] for further evaluation. Patient found to be COVID POSITIVE at PCP office earlier in the week. PCCM consulted 4/2 for worsening hypoxemia.   Significant Events: 4/1- admitted 4/2- intubated and OGT placed; proned; triple lumen PICC placed 4/3- proned 4/5- CRRT initiation 4/6- supine 4/9- CRRT stopped d/t persistent filter clotting, inability  to resolve issue; trickle rate TF initiated; CRRT re-started 4/10- TF increased to 35 ml/hr with plan to advance by 10 ml every 8 hours to goal of 65 ml/hr 4/11- TF placed on hold due hematochezia 4/12- HD cath moved to R femoral area; brief PEA episode which spontaneously resolved 4/14- HD cath and CRRT filters continuing to clog; started on heparin 4/15- plan to re-start TF at 10 ml/hr 4/17- begin advancing TF   Weight trending down and is currently the lowest it has been  during hospitalization. Patient remains intubated with OGT in place and is receiving Vital High Protein @ 10 ml/hr with 60 ml prostat TID. This regimen provides 840 kcal, 111 grams protein, and 201 ml free water. Patient continues on CRRT.   Able to talk with Dr. Sloan Leiter via secure chat. Will advance TF regimen as outlined above. Per his note today: COVID-19 positive, remains on vent with difficulty weaning and will likely require a trach, AKI with plan to continue CRRT at this time, perforated sigmoid diverticulum on admission--tolerating trickle TF and plan for advancement today, afib. Patient is a limited code.      Patient is currently intubated on ventilator support MV: 14.5 L/min as of 8:07 AM Temp (24hrs), Avg:97.8 F (36.6 C), Min:97 F (36.1 C), Max:98.5 F (36.9 C)   Medications reviewed; sliding scale novolog. Labs reviewed; CBG: 177 mg/dl today, Cl: 92 mmol/l, BUN: 42 mg/dl, creatinine: 3.14 mg/dl, ionized Ca: 0.36 mmol/l, GFR: 25 ml/min.  Drips; versed @ 3 mg/hr, fentanyl @ 200 mcg/hr, amiodarone @ 30 mg/hr, heparin @ 2700 units/hr.    Diet Order:   Diet Order            Diet NPO time specified  Diet effective now              EDUCATION NEEDS:   No education needs have been identified at this time  Skin:  Skin Assessment: Skin Integrity Issues: Skin Integrity Issues:: DTI, Stage II, Unstageable DTI:  coccyx (4/12) Stage II: bilateral face (4/10) Unstageable: full thickness to cervical spine area (4/15)  Last BM:  4/15  Height:   Ht Readings from Last 1 Encounters:  08/10/18 5\' 10"  (1.778 m)    Weight:   Wt Readings from Last 1 Encounters:  08/14/18 126.7 kg    Ideal Body Weight:  75.45 kg  BMI:  Body mass index is 40.08 kg/m.  Estimated Nutritional Needs:   Kcal:  2992-4268 kcal  Protein:  >/= 189 grams  Fluid:  >/= 2.2 L/day     Jarome Matin, MS, RD, LDN, [facility] Inpatient Clinical Dietitian Pager # [phone] After hours/weekend  pager # [phone]

## 2018-08-14 NOTE — Progress Notes (Signed)
NAME:  [name] Long, MRN:  [id number], DOB:  [birth year], LOS: 17 ADMISSION DATE:  08/06/2018, CONSULTATION DATE:  07/30/2018 REFERRING MD:  07/30/2018, CHIEF COMPLAINT:   Brief History   53 yo male presented to [facility] with several days of abdominal pain from diverticulitis with retained perforation.  Developed pneumonia with hypoxia from [place].  Transferred to [facility].  Past Medical History  DM2, HTN, Former smoker, OSA, A fib  [facility] Events   4/01 admission for diverticulitis 4/02 PCCM consulted for worsening dyspnea, hypoxemia 4/02 Proned  4/03 Proned  4/05 Improved oxygenation however ABG with respiratory and metabolic acidosis. Poor UOP 4/06 Supine; completed plaquenil 4/07 CVVHD, difficulty with filters clotting  4/08 CVVHD ongoing, AFwRVR afternoon of 4/8, started on amio gtt   4/12 hemodialysis catheter changed to right femoral location; brief episode of PEA resolved sponateously 08/11/2018 hemodialysis catheter and filter for CRRT machine or clotting off. 08/11/2018 started on heparin drip for atrial fibrillation in filter clotting on CRRT machine  Consults:  Renal GI signed off Surgery signed off Procedures:  ETT 4/2 >>  L IJ HD 4/6 >> 4/12 R Rad Aline 4/6 >>  4/12 right femoral HD catheter 24 cm>>  Significant Diagnostic Tests:  CT chest 4/01 [name] Long) >> b/l GGO in periphery CT abd/pelvis 4/01 [name] Long) >> perforated diverticulum lower descending colon, fatty liver with ?cirrhosis, splenomegaly 18.4 cm  Micro Data:  COVID 3/30 (PCP office) >> DETECTED Blood 4/12 >>  Antimicrobials:  Zosyn 4/1 >> 4/7 Zoysn 4/12 >>4/17  Interim history/subjective:  Melenic stool overnight. Unable to send occult blood card at that time. Weaning well on PSV 8/5 this morning.   Objective   Blood pressure (!) 162/93, pulse (!) 151, temperature 98.1 F (36.7 C), temperature source Oral, resp. rate 14, height 5\' 10"  (1.778 m), weight 126.7 kg, SpO2 100 %. CVP:  [9 mmHg-11  mmHg] 11 mmHg  Vent Mode: PRVC FiO2 (%):  [40 %] 40 % Set Rate:  [30 bmp] 30 bmp Vt Set:  [510 mL] 510 mL PEEP:  [5 cmH20-8 cmH20] 8 cmH20 Pressure Support:  [10 cmH20] 10 cmH20 Plateau Pressure:  [21 cmH20-24 cmH20] 24 cmH20   Intake/Output Summary (Last 24 hours) at 08/14/2018 1019 Last data filed at 08/14/2018 1000 Gross per 24 hour  Intake 3504.09 ml  Output 5810 ml  Net -2305.91 ml   Filed Weights   08/12/18 0500 08/13/18 0600 08/14/18 0554  Weight: (!) 136.7 kg 130 kg 126.7 kg   Physical Exam: General:  Morbidly obese male on vent Neuro:  RASS -3.  HEENT:  [place]/AT, PERRL, no appreviable JVD Cardiovascular:  Tachy, regular, no MRG. HR 130s. Sinus Lungs:  Clear Abdomen:  Soft, non-distended, non-tender Musculoskeletal:  No acute deformity  Skin: Grossly intact.    Resolved Hospital Problem list   Mixed Acidosis   Assessment & Plan:   Acute hypoxic respiratory failure with ARDS from COVID 19 pneumonia. Hx of OSA. Discussion: Plan Continue full vent support Weaning on PSV 8/5 and really doing well. Would not wean much further than this until we get him woken up a bit.  Continue to draw negative with CRRT as tolerated. (pulling 162mL/hr) Despite best weaning efforts, will likely need tracheostomy. Contact Dr. Nelda Marseille  Acute metabolic encephalopathy. Plan RASS goal 0 to -1 Discontinue versed infusion, change to PRN Continue fentanyl and wean as tolerated.   Perforated diverticulum Hematochezia Plan Continue Zosyn (Started 4/12 for hypotension and possible sepsis) low threshold to DC. Per primary Appreciate  GI and surgical consults: he is not a surgical candidate at this time.  TF trickle increasing per primary. Occult blood card pending. May need to hold heparin. Will defer to primary  AKI from ATN. Plan Continue CRRT per renal Trouble with filter clotting improved with heparin  DM type II Plan Per primary  Paroxysmal A fib (present prior to admission)  Plan Per primary: Amiodarone, heparin   Best practice:  Diet: NPO, tube feedings trickle DVT prophylaxis: SCDs GI prophylaxis: pantoprazole Mobility: bed rest Code Status: No CPR or defibrillation  Family Communication: Wife updated. OK with trach.  Disposition: ICU  Labs    CMP Latest Ref Rng & Units 08/14/2018 08/14/2018 08/14/2018  Glucose 70 - 99 mg/dL 184(H) - -  BUN 6 - 20 mg/dL 42(H) - -  Creatinine 0.61 - 1.24 mg/dL 3.14(H) - -  Sodium 135 - 145 mmol/L 136 137 134(L)  Potassium 3.5 - 5.1 mmol/L 3.9 3.9 4.1  Chloride 98 - 111 mmol/L 92(L) - -  CO2 22 - 32 mmol/L 29 - -  Calcium 8.9 - 10.3 mg/dL 9.6 - -  Total Protein 6.5 - 8.1 g/dL - - -  Total Bilirubin 0.3 - 1.2 mg/dL - - -  Alkaline Phos 38 - 126 U/L - - -  AST 15 - 41 U/L - - -  ALT 0 - 44 U/L - - -   CBC Latest Ref Rng & Units 08/14/2018 08/14/2018 08/14/2018  WBC 4.0 - 10.5 K/uL 2.5(L) - -  Hemoglobin 13.0 - 17.0 g/dL 8.8(L) 11.6(L) 9.2(L)  Hematocrit 39.0 - 52.0 % 28.3(L) 34.0(L) 27.0(L)  Platelets 150 - 400 K/uL 151 - -   ABG    Component Value Date/Time   PHART 7.512 (H) 08/13/2018 1741   PCO2ART 43.9 08/13/2018 1741   PO2ART 124.0 (H) 08/13/2018 1741   HCO3 31.7 (H) 08/14/2018 0212   TCO2 33 (H) 08/14/2018 0212   ACIDBASEDEF 2.0 08/10/2018 1554   O2SAT 63.0 08/14/2018 0212   CBG (last 3)  Recent Labs    08/13/18 1537 08/13/18 2023 08/14/18 0007  GLUCAP 159* 160* 177*    CC TIME: 45 mins  [name] Long, AGACNP-BC [place] Pager [phone] or [phone]  08/14/2018 10:19 AM

## 2018-08-14 NOTE — Progress Notes (Signed)
35 mL of midazolam (VERSED) ((50 mg/50 mL (1 mg/mL) premix infusion)) wasted in Stericycle. Witnessed by Nanci Pina, RN.

## 2018-08-14 NOTE — Progress Notes (Signed)
ANTICOAGULATION CONSULT NOTE  Pharmacy Consult for IV heparin Indication: positive COVID patient on CRRT needs systemic anticoagulation, a-fib coverage   No Known Allergies  Patient Measurements: Height: 5\' 10"  (177.8 cm) Weight: 286 lb 9.6 oz (130 kg) IBW/kg (Calculated) : 73 Heparin Dosing Weight: 104 kg  Vital Signs: Temp: 98.5 F (36.9 C) (04/16 2000) Temp Source: Axillary (04/16 2000) BP: 151/66 (04/16 2318) Pulse Rate: 142 (04/16 2318)  Labs: Recent Labs    08/12/18 0330  08/12/18 1602  08/13/18 0420  08/13/18 1529  08/13/18 1741 08/14/18 0206 08/14/18 0212  HGB 8.1*   < >  --    < > 8.5*   < >  --    < > 7.8* 9.2* 11.6*  HCT 25.6*   < >  --    < > 26.4*   < >  --    < > 23.0* 27.0* 34.0*  PLT 122*  --   --   --  137*  --   --   --   --   --   --   APTT 48*  --   --    < > 65*  --  66*  --   --  62*  --   HEPARINUNFRC  --    < >  --    < > 0.15*  --  0.18*  --   --  0.17*  --   CREATININE 4.74*  --  3.83*  --  3.37*  --  3.17*  --   --   --   --    < > = values in this interval not displayed.    Estimated Creatinine Clearance: 36.9 mL/min (A) (by C-G formula based on SCr of 3.17 mg/dL (H)).   Assessment: 52 y/oM to start heparin infusion per pharmacy. Positive COVID patient on CRRT needs systemic anticoagulation per CCM request. Also with a-fib. Patient previously on systemic heparin, but was stopped after patient developed hematochezia. However, dialysis catheter has continuously clotted off, neccesitating the restart of IV heparin.   4/17  0206 heparin level = 0.17 and aptt = 62 sec, subtherapeutic on heparin infusion at 2700 units/hr  Hgb =11.6 and plts pending this am  RN reports IV heparin running through port on HD catheter.  RN reports possible blood in stool- sending hemoccult with next stool.  Reports no problems with CRRT now.   Heparin level drawn through PICC line. No interruptions in therapy reported by RN.    Goal of Therapy:  Heparin  level 0.3-0.7 units/ml Monitor platelets by anticoagulation protocol: Yes   Plan:   Continue heparin drip at 2700 units/hr  F/u on hemoccult- consider increasing if negative  Daily CBC while on heparin infusion.   Monitor closely for signs and symptoms of bleeding  Dorrene German 08/14/2018, 3:10 AM

## 2018-08-14 NOTE — Progress Notes (Signed)
180 mL of fentaNYL ((2511mcg in NS 284mL (40mcg/ml) infusion-PREMIX )) wasted in stericycle. Witnessed by Vivien Rota, RN.

## 2018-08-14 NOTE — Progress Notes (Signed)
ANTICOAGULATION CONSULT NOTE  Pharmacy Consult for IV heparin Indication: positive COVID patient on CRRT needs systemic anticoagulation, a-fib coverage   No Known Allergies  Patient Measurements: Height: 5\' 10"  (177.8 cm) Weight: 279 lb 5.2 oz (126.7 kg) IBW/kg (Calculated) : 73 Heparin Dosing Weight: 104 kg  Vital Signs: Temp: 98.1 F (36.7 C) (04/17 0800) Temp Source: Oral (04/17 0800) BP: 147/71 (04/17 1153) Pulse Rate: 131 (04/17 1153)  Labs: Recent Labs    08/13/18 0420  08/13/18 1529  08/14/18 0206  08/14/18 0351 08/14/18 1020 08/14/18 1033  HGB 8.5*   < >  --    < > 9.2*   < > 8.8* 8.3* 11.9*  HCT 26.4*   < >  --    < > 27.0*   < > 28.3* 27.5* 35.0*  PLT 137*  --   --   --   --   --  151 150  --   APTT 65*  --  66*  --  62*  --   --  79*  --   HEPARINUNFRC 0.15*  --  0.18*  --  0.17*  --   --  0.24*  --   CREATININE 3.37*  --  3.17*  --   --   --  3.14*  --   --    < > = values in this interval not displayed.    Estimated Creatinine Clearance: 36.8 mL/min (A) (by C-G formula based on SCr of 3.14 mg/dL (H)).   Assessment: 52 y/oM to start heparin infusion per pharmacy. Positive COVID patient on CRRT needs systemic anticoagulation per CCM request. Also with a-fib. Patient previously on systemic heparin, but was stopped after patient developed hematochezia. However, dialysis catheter has continuously clotted off, neccesitating the restart of IV heparin.   Today, 08/14/18  APTT at 1020 therapeutic on current IV heparin rate of 2700 units/hr after being slightly subtherapeutic early this AM on same rate and rate was not adjusted due to melanic stool documented.   HL continues to be SUBtherapeutic but per discussion with [facility] pharmacist, this seems to be happening with COVID positive patients on CRRT and if aPTT is therapeutic and CRRT is running appropriately then will continue same IV heparin rate in this case  Hgb = 11.9 with latest reading but was 8.8 earlier  today, seems to jumping around quite a bit while on CRRT  RN reports IV heparin running through port on HD catheter.  RN reported possible blood in stool- sending hemoccult with next stool, BUT per RN no BM since that one early this AM  Reports no problems with CRRT now.   Heparin level drawn through PICC line. No interruptions in therapy reported by RN.   Goal of Therapy:  Heparin level 0.3-0.7 units/ml, aPTT 66-102sec Monitor platelets by anticoagulation protocol: Yes   Plan:   Continue heparin drip at 2700 units/hr  F/u on hemoccult- per CCM, decision to hold/stop IV heparin will be per primary service  Daily aPTT/HL/CBC while on heparin infusion.   Monitor closely for signs and symptoms of bleeding  Kara Mead 08/14/2018, 12:32 PM

## 2018-08-14 NOTE — Progress Notes (Signed)
[name] Long   Subjective:   This is a 53 year old gentleman diabetes hypertension obstructive sleep apnea on CPAP.  Admitted with  COVID-19.  He has been treated by CCM with Plaquenil azithromycin.  CRRT was initiated 08/02/2018.  Has had some clotting of the filter and required initiation of heparin with dialysis 08/03/2018. Then still clotting was changed to citrate anticoagulation.  He has been kept even.  HD cath was clogged and TPA'd twice on 4/14 then was started on systemic IV heparin as well as local citrate anticoag and the HD cath has been working well since. Around 4/15-16 patient's BP's improved and he was weaned off pressors and we started pulling fluid off w/ CRRT again.   Daily Update: Had a large black stool this am, not sure if GIB or not yet.   I/O yest was - 2.1 L net.  Wt's down to 127kg (peak 141kg, admit 133kg on 4/1). No sig filter clotting issues. K 3/9.  PTT 62 on systemic IV hep.     Objective:  Vital signs in last 24 hours:  Temp:  [97 F (36.1 C)-98.5 F (36.9 C)] 98 F (36.7 C) (04/17 0400) Pulse Rate:  [128-153] 143 (04/17 0800) Resp:  [4-35] 30 (04/17 0800) BP: (148-162)/(66-93) 162/93 (04/17 0314) SpO2:  [95 %-100 %] 100 % (04/17 0807) Arterial Line BP: (133-182)/(62-92) 136/76 (04/17 0800) FiO2 (%):  [40 %] 40 % (04/17 0807) Weight:  [126.7 kg] 126.7 kg (04/17 0554)  Weight change: -3.3 kg Filed Weights   08/12/18 0500 08/13/18 0600 08/14/18 0554  Weight: (!) 136.7 kg 130 kg 126.7 kg    Intake/Output: I/O last 3 completed shifts: In: 5504.7 [P.O.:130; I.V.:4543.5; Other:120; NG/GT:350; IV Piggyback:361.2] Out: 8901 [Other:8901]   Intake/Output this shift:  Total I/O In: 60 [I.V.:60] Out: 200 [Other:200]  Patient not physically examined in room due to patient being + for covid 19. Exam findings derived from RN, staff assessments and primary team assessments.   Assessment/ Plan:   Acute renal failure: Suspected ATN  possibly contrast related.  Worsening respiratory acidosis and metabolic acidosis >> CRRT initiated 08/02/18.  Hemodynamics improved significantly now, off pressors and removing net 2L per day. Wt's down. CVP stable 11.  No UOP. BP's normal now.  Cont UF as tolerated w/ CRRT.   Pneumonia/ COVID-19/ VDRF:  on vent, 40% FiO2. Weaning.   Afib w/ RVR  Diabetes mellitus per primary team  Obesity and obstructive sleep apnea  History of atrial fibrillation: on IV amiodarone  Electrolytes appear to be stable  Acid-base: resp / metabolic acidodis , controlled at this time  Volume: CXR better.  Under admit weight now.       LOS: 12 [facility] _0 _1 :03 AM  Basic Metabolic Panel: Recent Labs  Lab 08/11/18 0407  08/12/18 0330  08/12/18 1602  08/13/18 0420  08/13/18 1529 08/13/18 1733 08/13/18 1741 08/13/18 2209 08/14/18 0206 08/14/18 0212 08/14/18 0351  NA  --    < > 134*   < > 136   < > 136   < > 137 138 134*  --  134* 137 136  K  --    < > 4.8   < > 4.5   < > 3.9   < > 3.8 3.8 3.9  --  4.1 3.9 3.9  CL  --    < > 96*  --  97*  --  94*  --  93*  --   --   --   --   --  92*  CO2  --    < > 25  --  27  --  29  --  31  --   --   --   --   --  29  GLUCOSE  --    < > 134*  --  173*  --  161*  --  156*  --   --   --   --   --  184*  BUN  --    < > 77*  --  60*  --  51*  --  46*  --   --   --   --   --  42*  CREATININE  --    < > 4.74*  --  3.83*  --  3.37*  --  3.17*  --   --   --   --   --  3.14*  CALCIUM  --    < > 8.6*  --  8.7*  --  9.2  --  9.4  --   --   --   --   --  9.6  MG 2.3  --  2.4  --   --   --  2.2  --   --   --   --  2.1  --   --  2.3  PHOS  --    < > 5.8*  --  4.5  --  3.1  --  2.9  --   --   --   --   --  3.4   < > = values in this interval not displayed.    Liver Function Tests: Recent Labs  Lab 08/08/18 0420  08/11/18 1520 08/12/18 1602 08/13/18 0420 08/13/18 1529 08/14/18 0351  AST 61*  --   --   --   --   --   --   ALT 34  --   --   --   --   --    --   ALKPHOS 82  --   --   --   --   --   --   BILITOT 2.0*  --   --   --   --   --   --   PROT 5.5*  --   --   --   --   --   --   ALBUMIN 2.0*   < > 1.7* 2.0* 2.1* 2.2* 2.5*   < > = values in this interval not displayed.   No results for input(s): LIPASE, AMYLASE in the last 168 hours. No results for input(s): AMMONIA in the last 168 hours.  CBC: Recent Labs  Lab 08/09/18 0327  08/10/18 0220  08/12/18 0330  08/13/18 0420  08/13/18 1733 08/13/18 1741 08/14/18 0206 08/14/18 0212 08/14/18 0351  WBC 7.8  --  6.0  --  3.1*  --  2.9*  --   --   --   --   --  2.5*  NEUTROABS  --   --  4.9  --   --   --  1.8  --   --   --   --   --   --   HGB 10.4*   < > 10.0*   < > 8.1*   < > 8.5*   < > 10.2* 7.8* 9.2* 11.6* 8.8*  HCT 32.7*   < > 31.1*   < > 25.6*   < > 26.4*   < > 30.0* 23.0* 27.0*  34.0* 28.3*  MCV 94.5  --  93.7  --  91.8  --  92.0  --   --   --   --   --  92.8  PLT 197  --  133*  --  122*  --  137*  --   --   --   --   --  151   < > = values in this interval not displayed.    Cardiac Enzymes: No results for input(s): CKTOTAL, CKMB, CKMBINDEX, TROPONINI in the last 168 hours.  BNP: Invalid input(s): POCBNP  CBG: Recent Labs  Lab 08/13/18 0751 08/13/18 1242 08/13/18 1537 08/13/18 2023 08/14/18 0007  GLUCAP 145* 175* 159* 160* 177*    Microbiology: Results for orders placed or performed during the hospital encounter of 08/14/2018  MRSA PCR Screening     Status: None   Collection Time: 07/30/18  2:30 AM  Result Value Ref Range Status   MRSA by PCR NEGATIVE NEGATIVE Final    Comment:        The GeneXpert MRSA Assay (FDA approved for NASAL specimens only), is one component of a comprehensive MRSA colonization surveillance program. It is not intended to diagnose MRSA infection nor to guide or monitor treatment for MRSA infections. Performed at [facility], [place] [address], [place], [place] [address]   Culture, blood (routine x 2)     Status:  None   Collection Time: 08/09/18  3:46 PM  Result Value Ref Range Status   Specimen Description   Final    BLOOD RIGHT HAND Performed at [facility] [address] [place], [place] [address]    Special Requests   Final    BOTTLES DRAWN AEROBIC ONLY Blood Culture adequate volume Performed at [facility] [address] [place], [place] [address]    Culture   Final    NO GROWTH 5 DAYS Performed at [facility], [place] [address] [place], [place] [address]    Report Status 08/14/2018 FINAL  Final    Coagulation Studies: No results for input(s): LABPROT, INR in the last 72 hours.  Urinalysis: No results for input(s): COLORURINE, LABSPEC, PHURINE, GLUCOSEU, HGBUR, BILIRUBINUR, KETONESUR, PROTEINUR, UROBILINOGEN, NITRITE, LEUKOCYTESUR in the last 72 hours.  Invalid input(s): APPERANCEUR    Imaging: No results found.   Medications:   .  prismasol BGK 4/2.5 500 mL/hr at 08/13/18 2316  . sodium chloride Stopped (08/10/18 0653)  . amiodarone 30 mg/hr (08/13/18 2237)  . anticoagulant sodium citrate    . calcium gluconate infusion for CRRT 60 mL/hr at 08/14/18 0349  . fentaNYL infusion INTRAVENOUS 200 mcg/hr (08/14/18 0700)  . heparin    . midazolam 3 mg/hr (08/14/18 0352)  . piperacillin-tazobactam (ZOSYN)  IV 2.25 g (08/14/18 5009)  . prismasol BGK 4/2.5 2,000 mL/hr at 08/14/18 0608  . sodium citrate 2 %/dextrose 2.5% solution 3000 mL 400 mL/hr at 08/14/18 0310   . chlorhexidine gluconate (MEDLINE KIT)  15 mL Mouth Rinse BID  . Chlorhexidine Gluconate Cloth  6 each Topical Daily  . feeding supplement (PRO-STAT SUGAR FREE 64)  60 mL Per Tube TID  . feeding supplement (VITAL HIGH PROTEIN)  1,000 mL Per Tube Q24H  . insulin aspart  0-9 Units Subcutaneous Q4H  . mouth rinse  15 mL Mouth Rinse 10 times per day  . pantoprazole (PROTONIX) IV  40 mg Intravenous Q24H  . sodium chloride flush  10-40 mL Intracatheter Q12H  Place/Maintain arterial line **AND** sodium chloride, acetaminophen (TYLENOL) oral liquid 160 mg/5 mL, albuterol, anticoagulant sodium citrate, fentaNYL, hydrALAZINE, lip balm, midazolam, midazolam, [DISCONTINUED] ondansetron **OR** ondansetron (ZOFRAN) IV, sodium chloride flush

## 2018-08-14 NOTE — Progress Notes (Signed)
PROGRESS NOTE    [name] Long  RXV:[id number] DOB: May 21, 1965 DOA: 08/07/2018 PCP: Patient, No Pcp Per    Brief Narrative:  53 year old gentleman with a history of type 2 diabetes, hypertension, obstructive sleep apnea on CPAP who remains in the hospital since 08/11/2018. He was initially admitted to [facility] emergency room with many days of abdominal pain and recurrent fevers.  He had acute sigmoid diverticulitis with localized peritonitis and perforation along with COVID-19 pneumonia.  Initially he was noted to have mild hypoxemia on admission, he subsequently deteriorated needing mechanical ventilation.  He has been suffering from multiple problems since then.  Remains critically ill, now on vent, CRRT. Rapid Afib.  Assessment & Plan:   Principal Problem:   ARDS (adult respiratory distress syndrome) (HCC) Active Problems:   COVID-19 virus infection   Diverticulitis of colon with perforation   HTN (hypertension)   DM2 (diabetes mellitus, type 2) (HCC)   Neutropenia (HCC)   Thrombocytopenia (HCC)   Pressure injury of skin  Acute respiratory distress syndrome due to COVID-19 infection: Patient remains on mechanical ventilation with difficult to wean from the ventilator.  Likely needing tracheostomy for ongoing ventilator support because of underlying morbid obesity and sleep apnea.  Followed by critical care. Continue current mechanical ventilation as per critical care. He was not a candidate for Tocilizumab because of acute diverticulitis.  Acute kidney injury: Followed by nephrology.  Currently remains on CRRT.  Perforated sigmoid diverticulum: Localized diverticulum.  Patient was followed by GI and surgery.  Signed off.  Patient does have some bowel function.  Tolerated trickle feeding. He had a bowel movement. Hopefully we can advance diet and monitor. Patient remains on Zosyn. Will advance tube feeding today and monitor bowel functions.  paroxysmal atrial fibrillation: Heart rate  control is fluctuating.  Patient is on amiodarone drip.  He is also on heparin drip.  Type 2 diabetes: Remains on insulin and well controlled.   Anemia of due to acute illness: Hb ranges from 9-11-8.8 with no evidence of active bleeding.  We will continue monitoring.  Currently no indication for transfusion.  DVT prophylaxis: Patient on heparin drip. Code Status: No CPR.  Okay for defibrillation. Family Communication: wife.  Communicated by critical care. Disposition Plan: ICU   Consultants:   PCCM  Surgery, signed off  GI , signed off  Renal , CRRT  Procedures:  ETT 4/2 >>  L IJ HD 4/6 >> 4/12 R Rad Aline 4/6 >>  4/12 right femoral HD catheter 24 cm>>  Antimicrobials:   Zosyn 4/1-4/7  Zosyn 4/12--- ongoing    Subjective: Patient seen and examined. He is sedated.  No overnight events.  Heart rate control remains a problem. He is getting CRRT.  Had a large bowel movement last night that was dark.  FMS was inserted.   Objective: Vitals:   08/14/18 0554 08/14/18 0800 08/14/18 0807 08/14/18 0900  BP:      Pulse:  (!) 143  (!) 151  Resp: (!) 26 (!) 30  14  Temp:  98.1 F (36.7 C)    TempSrc:  Oral    SpO2:  100% 100% 100%  Weight: 126.7 kg     Height:        Intake/Output Summary (Last 24 hours) at 08/14/2018 0941 Last data filed at 08/14/2018 0900 Gross per 24 hour  Intake 3690.22 ml  Output 5829 ml  Net -2138.78 ml   Filed Weights   08/12/18 0500 08/13/18 0600 08/14/18 0554  Weight: (!) 136.7  kg 130 kg 126.7 kg    Examination:  General: Morbidly obese male who is sedated on full mechanical ventilatory support.  HEENT: Pupils equal reactive.  No JVD or lymphadenopathy is appreciated Neuro: Opens eyes to stimuli. CV: Heart sounds irregular irregular with poorly controlled ventricular rate in the setting of atrial fibrillation PULM: clear air entry. decreased breath sounds in the bases GI: Abdomen is obese and pendulous.  Patient is receiving 10 mL/h  of tube feeding and tolerating well. Bowel sounds sluggish.  No rigidity or guarding. Extremities: Warm and dry, 2+ edema, anasarca. Skin: Warm to touch    Data Reviewed: I have personally reviewed following labs and imaging studies  CBC: Recent Labs  Lab 08/09/18 0327  08/10/18 0220  08/12/18 0330  08/13/18 0420  08/13/18 1733 08/13/18 1741 08/14/18 0206 08/14/18 0212 08/14/18 0351  WBC 7.8  --  6.0  --  3.1*  --  2.9*  --   --   --   --   --  2.5*  NEUTROABS  --   --  4.9  --   --   --  1.8  --   --   --   --   --   --   HGB 10.4*   < > 10.0*   < > 8.1*   < > 8.5*   < > 10.2* 7.8* 9.2* 11.6* 8.8*  HCT 32.7*   < > 31.1*   < > 25.6*   < > 26.4*   < > 30.0* 23.0* 27.0* 34.0* 28.3*  MCV 94.5  --  93.7  --  91.8  --  92.0  --   --   --   --   --  92.8  PLT 197  --  133*  --  122*  --  137*  --   --   --   --   --  151   < > = values in this interval not displayed.   Basic Metabolic Panel: Recent Labs  Lab 08/11/18 0407  08/12/18 0330  08/12/18 1602  08/13/18 0420  08/13/18 1529 08/13/18 1733 08/13/18 1741 08/13/18 2209 08/14/18 0206 08/14/18 0212 08/14/18 0351  NA  --    < > 134*   < > 136   < > 136   < > 137 138 134*  --  134* 137 136  K  --    < > 4.8   < > 4.5   < > 3.9   < > 3.8 3.8 3.9  --  4.1 3.9 3.9  CL  --    < > 96*  --  97*  --  94*  --  93*  --   --   --   --   --  92*  CO2  --    < > 25  --  27  --  29  --  31  --   --   --   --   --  29  GLUCOSE  --    < > 134*  --  173*  --  161*  --  156*  --   --   --   --   --  184*  BUN  --    < > 77*  --  60*  --  51*  --  46*  --   --   --   --   --  42*  CREATININE  --    < >  4.74*  --  3.83*  --  3.37*  --  3.17*  --   --   --   --   --  3.14*  CALCIUM  --    < > 8.6*  --  8.7*  --  9.2  --  9.4  --   --   --   --   --  9.6  MG 2.3  --  2.4  --   --   --  2.2  --   --   --   --  2.1  --   --  2.3  PHOS  --    < > 5.8*  --  4.5  --  3.1  --  2.9  --   --   --   --   --  3.4   < > = values in this interval not  displayed.   GFR: Estimated Creatinine Clearance: 36.8 mL/min (A) (by C-G formula based on SCr of 3.14 mg/dL (H)). Liver Function Tests: Recent Labs  Lab 08/08/18 0420  08/11/18 1520 08/12/18 1602 08/13/18 0420 08/13/18 1529 08/14/18 0351  AST 61*  --   --   --   --   --   --   ALT 34  --   --   --   --   --   --   ALKPHOS 82  --   --   --   --   --   --   BILITOT 2.0*  --   --   --   --   --   --   PROT 5.5*  --   --   --   --   --   --   ALBUMIN 2.0*   < > 1.7* 2.0* 2.1* 2.2* 2.5*   < > = values in this interval not displayed.   No results for input(s): LIPASE, AMYLASE in the last 168 hours. No results for input(s): AMMONIA in the last 168 hours. Coagulation Profile: No results for input(s): INR, PROTIME in the last 168 hours. Cardiac Enzymes: No results for input(s): CKTOTAL, CKMB, CKMBINDEX, TROPONINI in the last 168 hours. BNP (last 3 results) No results for input(s): PROBNP in the last 8760 hours. HbA1C: No results for input(s): HGBA1C in the last 72 hours. CBG: Recent Labs  Lab 08/13/18 0751 08/13/18 1242 08/13/18 1537 08/13/18 2023 08/14/18 0007  GLUCAP 145* 175* 159* 160* 177*   Lipid Profile: No results for input(s): CHOL, HDL, LDLCALC, TRIG, CHOLHDL, LDLDIRECT in the last 72 hours. Thyroid Function Tests: No results for input(s): TSH, T4TOTAL, FREET4, T3FREE, THYROIDAB in the last 72 hours. Anemia Panel: No results for input(s): VITAMINB12, FOLATE, FERRITIN, TIBC, IRON, RETICCTPCT in the last 72 hours. Sepsis Labs: Recent Labs  Lab 08/08/18 1102 08/08/18 1402  LATICACIDVEN 1.0 1.0    Recent Results (from the past 240 hour(s))  Culture, blood (routine x 2)     Status: None   Collection Time: 08/09/18  3:46 PM  Result Value Ref Range Status   Specimen Description   Final    BLOOD RIGHT HAND Performed at [facility] [address] [place], [place] [address]    Special Requests   Final    BOTTLES DRAWN AEROBIC ONLY Blood  Culture adequate volume Performed at [facility] [address] [place],  [address]    Culture   Final    NO GROWTH 5 DAYS Performed at [facility]  Lab, [address], [place], [place] [address]    Report Status 08/14/2018 FINAL  Final         Radiology Studies: No results found.      Scheduled Meds: . chlorhexidine gluconate (MEDLINE KIT)  15 mL Mouth Rinse BID  . Chlorhexidine Gluconate Cloth  6 each Topical Daily  . feeding supplement (PRO-STAT SUGAR FREE 64)  60 mL Per Tube TID  . feeding supplement (VITAL HIGH PROTEIN)  1,000 mL Per Tube Q24H  . insulin aspart  0-9 Units Subcutaneous Q4H  . mouth rinse  15 mL Mouth Rinse 10 times per day  . pantoprazole (PROTONIX) IV  40 mg Intravenous Q24H  . sodium chloride flush  10-40 mL Intracatheter Q12H   Continuous Infusions: .  prismasol BGK 4/2.5 500 mL/hr at 08/13/18 2316  . sodium chloride Stopped (08/10/18 0653)  . amiodarone 30 mg/hr (08/13/18 2237)  . anticoagulant sodium citrate    . calcium gluconate infusion for CRRT 60 mL/hr at 08/14/18 0349  . fentaNYL infusion INTRAVENOUS    . heparin 2,700 Units/hr (08/14/18 0430)  . midazolam 3 mg/hr (08/14/18 0900)  . piperacillin-tazobactam (ZOSYN)  IV 2.25 g (08/14/18 1100)  . prismasol BGK 4/2.5 2,000 mL/hr at 08/14/18 0608  . sodium citrate 2 %/dextrose 2.5% solution 3000 mL 400 mL/hr at 08/14/18 0310     LOS: 16 days    Time spent: 25 minutes    Barb Merino, MD [facility] Pager [phone]  If 7PM-7AM, please contact night-coverage [email or web address] Password [facility] 08/14/2018, 9:41 AM

## 2018-08-14 NOTE — Progress Notes (Signed)
eLink Physician-Brief Progress Note Patient Name: [name] Long DOB: Jul 08, 1965 MRN: [id number]   Date of Service  08/14/2018  HPI/Events of Note  Large dark stool.  eICU Interventions  Please send card for occult blood to lab.      Intervention Category Major Interventions: Other:  Lysle Dingwall 08/14/2018, 3:17 AM

## 2018-08-15 LAB — CBC
HCT: 27.5 % — ABNORMAL LOW (ref 39.0–52.0)
Hemoglobin: 8.2 g/dL — ABNORMAL LOW (ref 13.0–17.0)
MCH: 28.1 pg (ref 26.0–34.0)
MCHC: 29.8 g/dL — ABNORMAL LOW (ref 30.0–36.0)
MCV: 94.2 fL (ref 80.0–100.0)
Platelets: 170 10*3/uL (ref 150–400)
RBC: 2.92 MIL/uL — ABNORMAL LOW (ref 4.22–5.81)
RDW: 19.2 % — ABNORMAL HIGH (ref 11.5–15.5)
WBC: 1.4 10*3/uL — CL (ref 4.0–10.5)
nRBC: 1.4 % — ABNORMAL HIGH (ref 0.0–0.2)

## 2018-08-15 LAB — BLOOD GAS, ARTERIAL
Acid-Base Excess: 10 mmol/L — ABNORMAL HIGH (ref 0.0–2.0)
Bicarbonate: 34.4 mmol/L — ABNORMAL HIGH (ref 20.0–28.0)
Drawn by: 308601
FIO2: 40
MECHVT: 510 mL
O2 Saturation: 98.4 %
PEEP: 8 cmH2O
Patient temperature: 97
RATE: 16 resp/min
pCO2 arterial: 45.2 mmHg (ref 32.0–48.0)
pH, Arterial: 7.49 — ABNORMAL HIGH (ref 7.350–7.450)
pO2, Arterial: 98.8 mmHg (ref 83.0–108.0)

## 2018-08-15 LAB — RENAL FUNCTION PANEL
Albumin: 2.3 g/dL — ABNORMAL LOW (ref 3.5–5.0)
Albumin: 2.1 g/dL — ABNORMAL LOW (ref 3.5–5.0)
Anion gap: 13 (ref 5–15)
Anion gap: 14 (ref 5–15)
BUN: 42 mg/dL — ABNORMAL HIGH (ref 6–20)
BUN: 43 mg/dL — ABNORMAL HIGH (ref 6–20)
CO2: 31 mmol/L (ref 22–32)
CO2: 29 mmol/L (ref 22–32)
Calcium: 9.2 mg/dL (ref 8.9–10.3)
Calcium: 7.8 mg/dL — ABNORMAL LOW (ref 8.9–10.3)
Chloride: 92 mmol/L — ABNORMAL LOW (ref 98–111)
Chloride: 94 mmol/L — ABNORMAL LOW (ref 98–111)
Creatinine, Ser: 2.79 mg/dL — ABNORMAL HIGH (ref 0.61–1.24)
Creatinine, Ser: 3.05 mg/dL — ABNORMAL HIGH (ref 0.61–1.24)
GFR calc Af Amer: 29 mL/min — ABNORMAL LOW (ref >60)
GFR calc Af Amer: 26 mL/min — ABNORMAL LOW (ref >60)
GFR calc non Af Amer: 25 mL/min — ABNORMAL LOW (ref >60)
GFR calc non Af Amer: 22 mL/min — ABNORMAL LOW (ref >60)
Glucose, Bld: 195 mg/dL — ABNORMAL HIGH (ref 70–99)
Glucose, Bld: 209 mg/dL — ABNORMAL HIGH (ref 70–99)
Phosphorus: 2.3 mg/dL — ABNORMAL LOW (ref 2.5–4.6)
Phosphorus: 2.1 mg/dL — ABNORMAL LOW (ref 2.5–4.6)
Potassium: 3.7 mmol/L (ref 3.5–5.1)
Potassium: 3.8 mmol/L (ref 3.5–5.1)
Sodium: 136 mmol/L (ref 135–145)
Sodium: 137 mmol/L (ref 135–145)

## 2018-08-15 LAB — GLUCOSE, CAPILLARY
Glucose-Capillary: 161 mg/dL — ABNORMAL HIGH (ref 70–99)
Glucose-Capillary: 161 mg/dL — ABNORMAL HIGH (ref 70–99)
Glucose-Capillary: 177 mg/dL — ABNORMAL HIGH (ref 70–99)
Glucose-Capillary: 183 mg/dL — ABNORMAL HIGH (ref 70–99)
Glucose-Capillary: 186 mg/dL — ABNORMAL HIGH (ref 70–99)
Glucose-Capillary: 205 mg/dL — ABNORMAL HIGH (ref 70–99)

## 2018-08-15 LAB — MAGNESIUM: Magnesium: 2.1 mg/dL (ref 1.7–2.4)

## 2018-08-15 LAB — APTT
aPTT: 64 seconds — ABNORMAL HIGH (ref 24–36)
aPTT: 61 seconds — ABNORMAL HIGH (ref 24–36)

## 2018-08-15 LAB — HEPARIN LEVEL (UNFRACTIONATED): Heparin Unfractionated: 0.22 IU/mL — ABNORMAL LOW (ref 0.30–0.70)

## 2018-08-15 LAB — CALCIUM, IONIZED: Calcium, Ionized, Serum: 4.6 mg/dL (ref 4.5–5.6)

## 2018-08-15 MED ORDER — PRISMASOL B22GK 4/0 22-4 MEQ/L IV SOLN
INTRAVENOUS | Status: DC
  Administered 2018-08-15 – 2018-08-22 (×31): via INTRAVENOUS_CENTRAL
  Filled 2018-08-15 (×84): qty 5000

## 2018-08-15 NOTE — Progress Notes (Signed)
ANTICOAGULATION CONSULT NOTE  Pharmacy Consult for IV heparin Indication: positive COVID patient on CRRT needs systemic anticoagulation, a-fib coverage   No Known Allergies  Patient Measurements: Height: 5\' 10"  (177.8 cm) Weight: 278 lb 3.5 oz (126.2 kg) IBW/kg (Calculated) : 73 Heparin Dosing Weight: 104 kg  Vital Signs: Temp: 101 F (38.3 C) (04/18 1700) Temp Source: Axillary (04/18 1700) BP: 157/75 (04/18 0754) Pulse Rate: 120 (04/18 1700)  Labs: Recent Labs    08/14/18 0206  08/14/18 0351  08/14/18 1020  08/14/18 1600  08/14/18 1815 08/14/18 1954 08/15/18 0453 08/15/18 1600  HGB 9.2*   < > 8.8*   < > 8.3*   < >  --    < > 10.2* 10.5* 8.2*  --   HCT 27.0*   < > 28.3*   < > 27.5*   < >  --    < > 30.0* 31.0* 27.5*  --   PLT  --   --  151  --  150  --   --   --   --   --  170  --   APTT 62*  --   --   --  79*  --   --   --   --   --  64* 61*  HEPARINUNFRC 0.17*  --   --   --  0.24*  --   --   --   --   --  0.22*  --   CREATININE  --   --  3.14*  --   --   --  2.97*  --   --   --  2.79* 3.05*   < > = values in this interval not displayed.    Estimated Creatinine Clearance: 37.8 mL/min (A) (by C-G formula based on SCr of 3.05 mg/dL (H)).   Assessment: 52 y/oM to start heparin infusion per pharmacy. Positive COVID patient on CRRT needs systemic anticoagulation per CCM request. Also with a-fib. Patient previously on systemic heparin, but was stopped after patient developed hematochezia. However, dialysis catheter has continuously clotted off, neccesitating the restart of IV heparin.   Today, 08/15/18  APTT =61 sec @ 1600; subtherapeutic again & trending down   RN reports no bleeding or infusion related issues  Reports no problems with CRRT now.   CBC monitoring changed to once daily  Goal of Therapy:  Heparin level 0.3-0.7 units/ml, aPTT 66-102sec Monitor platelets by anticoagulation protocol: Yes   Plan:   Increase heparin drip at 2800 units/hr  Daily  aPTT/HL/CBC while on heparin infusion.   Monitor closely for signs and symptoms of bleeding  Biagio Borg 08/15/2018, 5:55 PM

## 2018-08-15 NOTE — Progress Notes (Signed)
NAME:  [name] Long, MRN:  [id number], DOB:  28-Nov-1965, LOS: 71 ADMISSION DATE:  08/14/2018, CONSULTATION DATE:  07/30/2018 REFERRING MD:  07/30/2018, CHIEF COMPLAINT:   Brief History   53 yo male presented to [facility] with several days of abdominal pain from diverticulitis with retained perforation.  Developed pneumonia with hypoxia from [place].  Transferred to [facility].  Past Medical History  DM2, HTN, Former smoker, OSA, A fib  [facility] Events   4/01 admission for diverticulitis 4/02 PCCM consulted for worsening dyspnea, hypoxemia 4/02 Proned  4/03 Proned  4/05 Improved oxygenation however ABG with respiratory and metabolic acidosis. Poor UOP 4/06 Supine; completed plaquenil 4/07 CVVHD, difficulty with filters clotting  4/08 CVVHD ongoing, AFwRVR afternoon of 4/8, started on amio gtt   4/12 hemodialysis catheter changed to right femoral location; brief episode of PEA resolved sponateously 08/11/2018 hemodialysis catheter and filter for CRRT machine or clotting off. 08/11/2018 started on heparin drip for atrial fibrillation in filter clotting on CRRT machine  Consults:  Renal GI signed off Surgery signed off Procedures:  ETT 4/2 >>  L IJ HD 4/6 >> 4/12 R Rad Aline 4/6 >>  4/12 right femoral HD catheter 24 cm>>  Significant Diagnostic Tests:  CT chest 4/01 [name] Long) >> b/l GGO in periphery CT abd/pelvis 4/01 [name] Long) >> perforated diverticulum lower descending colon, fatty liver with ?cirrhosis, splenomegaly 18.4 cm  Micro Data:  COVID 3/30 (PCP office) >> DETECTED Blood 4/12 >>  Antimicrobials:  Zosyn 4/1 >> 4/7 Zoysn 4/12 >>4/17  Interim history/subjective:  Was off sedation transiently 4/17, then had to be started on Precedex. He has tolerated pressure support 8 very well, used PRVC auto mode for the rest of the day yesterday 4/17 Had some agitation requiring increased fentanyl, sedation overnight.  Resultant hypotension and low-dose norepinephrine started.  Now  weaned to off.  Progressive leukopenia, Zosyn stopped 4/17   Objective   Blood pressure (!) 157/75, pulse (!) 122, temperature 99 F (37.2 C), temperature source Axillary, resp. rate 16, height 5\' 10"  (1.778 m), weight 126.2 kg, SpO2 100 %. CVP:  [6 mmHg-9 mmHg] 6 mmHg  Vent Mode: CPAP;PSV FiO2 (%):  [6 %-40 %] 6 % Set Rate:  [1 bmp-30 bmp] 1 bmp Vt Set:  [510 mL] 510 mL PEEP:  [5 cmH20-8 cmH20] 5 cmH20 Pressure Support:  [8 cmH20] 8 cmH20 Plateau Pressure:  [22 cmH20] 22 cmH20   Intake/Output Summary (Last 24 hours) at 08/15/2018 1126 Last data filed at 08/15/2018 1100 Gross per 24 hour  Intake 3621.19 ml  Output 5088 ml  Net -1466.81 ml   Filed Weights   08/13/18 0600 08/14/18 0554 08/15/18 0500  Weight: 130 kg 126.7 kg 126.2 kg   Physical Exam: General: Obese ill-appearing man, mechanically ventilated, on CVVHD Neuro: Grimaces with pain, RASS -3 to -4, did not open eyes, did not follow commands HEENT: ET tube in place, scleral edema, unable to assess JVP Cardiovascular: Regular, tachycardic, no murmur Lungs: Coarse bilaterally but no wheezes or crackles Abdomen: Soft, obese, nondistended, positive bowel sounds Musculoskeletal: No apparent deformities Skin: No rash   Resolved Hospital Problem list   Mixed Acidosis   Assessment & Plan:   Acute hypoxic respiratory failure with ARDS from COVID 19 pneumonia. Hx of OSA. Discussion: Plan Continue full vent support Weaning on PSV 8/5 and really doing well. Would not wean much further than this until we get him woken up a bit.  Continue to draw negative with CRRT as tolerated. (pulling 169mL/hr)  Despite best weaning efforts, will likely need tracheostomy. Contact Dr. Nelda Marseille  Acute metabolic encephalopathy. Plan Need to lighten sedation.  Versed stopped on 4/17.  He did have some increased heart rate, tachypnea and so Precedex was started on 4/17.  Ceiling placed on this.  Using fentanyl, enteral meds.  Perforated  diverticulum Hematochezia Plan Second course zosyn completed 4/17 Tolerating heparin, serial Hgb stable, would change to qd Appreciate GI and surgical consults, not a surgical candidate, following expectantly Trickle tube feeding, titrate up as able  Shock.  Suspect that this was related to sedating meds, negative fluid balance. Plan Would like to keep him at I=O for now Wean norepinephrine to off  AKI from ATN. Plan CRRT running, appreciate renal management Keeping him 50 cc/h positive at this time.  Overall is 3.7 L negative  Leukopenia, question due to his COVID-19, consider medications that could contribute Plan Zosyn stopped on 4/17 Review meds w pharmacy  DM type II Plan Per primary  Paroxysmal A fib (present prior to admission) Plan Heart rate more labile as sedation has been decreased Amiodarone, heparin   Best practice:  Diet: NPO, tube feedings trickle DVT prophylaxis: SCDs GI prophylaxis: pantoprazole Mobility: bed rest Code Status: No CPR or defibrillation  Family Communication: Wife updated. OK with trach if needed Disposition: ICU  Labs    CMP Latest Ref Rng & Units 08/15/2018 08/14/2018 08/14/2018  Glucose 70 - 99 mg/dL 195(H) - -  BUN 6 - 20 mg/dL 42(H) - -  Creatinine 0.61 - 1.24 mg/dL 2.79(H) - -  Sodium 135 - 145 mmol/L 136 137 138  Potassium 3.5 - 5.1 mmol/L 3.7 3.8 3.6  Chloride 98 - 111 mmol/L 92(L) - -  CO2 22 - 32 mmol/L 31 - -  Calcium 8.9 - 10.3 mg/dL 9.2 - -  Total Protein 6.5 - 8.1 g/dL - - -  Total Bilirubin 0.3 - 1.2 mg/dL - - -  Alkaline Phos 38 - 126 U/L - - -  AST 15 - 41 U/L - - -  ALT 0 - 44 U/L - - -   CBC Latest Ref Rng & Units 08/15/2018 08/14/2018 08/14/2018  WBC 4.0 - 10.5 K/uL 1.4(LL) - -  Hemoglobin 13.0 - 17.0 g/dL 8.2(L) 10.5(L) 10.2(L)  Hematocrit 39.0 - 52.0 % 27.5(L) 31.0(L) 30.0(L)  Platelets 150 - 400 K/uL 170 - -   ABG    Component Value Date/Time   PHART 7.490 (H) 08/15/2018 0438   PCO2ART 45.2  08/15/2018 0438   PO2ART 98.8 08/15/2018 0438   HCO3 34.4 (H) 08/15/2018 0438   TCO2 33 (H) 08/14/2018 1954   ACIDBASEDEF 2.0 08/10/2018 1554   O2SAT 98.4 08/15/2018 0438   CBG (last 3)  Recent Labs    08/14/18 2357 08/15/18 0445 08/15/18 0824  GLUCAP 161* 186* 161*    Independent critical care time 33 minutes   Baltazar Apo, MD, PhD 08/15/2018, 11:39 AM [facility] [phone] or if no answer [phone]

## 2018-08-15 NOTE — Progress Notes (Addendum)
[facility] ROUNDING NOTE   Subjective:   This is a 53 year old gentleman diabetes hypertension obstructive sleep apnea on CPAP.  Admitted with  COVID-19.  He has been treated by CCM with Plaquenil azithromycin.  CRRT was initiated 08/02/2018.  Has had some clotting of the filter and required initiation of heparin with dialysis 08/03/2018. Then still clotting was changed to citrate anticoagulation.  He has been kept even.  HD cath was clogged and TPA'd twice on 4/14 then was started on systemic IV heparin as well as local citrate anticoag and the HD cath has been working well since. Around 4/15-16 patient's BP's improved and he was weaned off pressors and we started pulling fluid off w/ CRRT again.   Daily Update: per RN pt became agitated overnight, sedation needed to be ^'d and pressors needed w/ ^'d sedation.  Labs ok, I/O +1.2 L net, wt's down 0.8kg. to 125.4kg.  no filter clotting issues  (Admit wt 133kg 4/1, peak 141kg on 4/5)    Objective:  Vital signs in last 24 hours:  Temp:  [97.8 F (36.6 C)-98.4 F (36.9 C)] 98.4 F (36.9 C) (04/18 0400) Pulse Rate:  [93-151] 116 (04/18 0754) Resp:  [11-20] 16 (04/18 0754) BP: (86-157)/(47-75) 157/75 (04/18 0754) SpO2:  [95 %-100 %] 100 % (04/18 0754) Arterial Line BP: (84-157)/(46-73) 157/70 (04/18 0500) FiO2 (%):  [6 %-40 %] 6 % (04/18 0754) Weight:  [126.2 kg] 126.2 kg (04/18 0500)  Weight change: -0.5 kg Filed Weights   08/13/18 0600 08/14/18 0554 08/15/18 0500  Weight: 130 kg 126.7 kg 126.2 kg    Intake/Output: I/O last 3 completed shifts: In: 9485 [I.V.:4540.5; Other:60; NG/GT:659.5; IV Piggyback:300] Out: 4627 [OJJKK:9381; WEXHB:716]   Intake/Output this shift:  Total I/O In: 154.6 [I.V.:124.6; NG/GT:30] Out: 259 [Other:259]  Patient not physically examined in room due to patient being + for covid 19. Exam findings derived from RN, staff assessments and primary team assessments.   Assessment/ Plan:   Acute  renal failure: Suspected ATN possibly contrast related.  Worsening respiratory acidosis and metabolic acidosis >> CRRT initiated 08/02/18. D# 15 CRRT. Getting  Metoprolol 25 bid for afib/ RVR. ^'d sedation overnight for agitation and back on pressors. CVP's lower today and wt's down. Concerned may be too dry. Will keep +75 cc/hr today. Cont B22GK 4/0 dialysate while getting citrate AC to counter tendency for ^Ca++ and alkalosis.   Filter/ HD cath clotting: stable now on local citrate AC + systemic heparin IV  COVID+ PNA/ VDRF: some weaning done the past few days  Leukopenia   Afib w/ RVR - on metoprolol 25 bid per ng and IV amio  Shock - on pressors off and on depending on sedation medications  Diabetes mellitus per primary team  Obesity and obstructive sleep apnea  Volume: no vol excess     LOS: 12 [name] Long '@TODAY' '@8' :03 AM  Basic Metabolic Panel: Recent Labs  Lab 08/12/18 0330  08/13/18 0420  08/13/18 1529  08/13/18 2209  08/14/18 0351  08/14/18 1600  08/14/18 1615 08/14/18 1812 08/14/18 1815 08/14/18 1954 08/15/18 0453  NA 134*   < > 136   < > 137   < >  --    < > 136   < > 136   < > 135 135 138 137 136  K 4.8   < > 3.9   < > 3.8   < >  --    < > 3.9   < > 3.9   < >  4.0 3.9 3.6 3.8 3.7  CL 96*   < > 94*  --  93*  --   --   --  92*  --  92*  --   --   --   --   --  92*  CO2 25   < > 29  --  31  --   --   --  29  --  31  --   --   --   --   --  31  GLUCOSE 134*   < > 161*  --  156*  --   --   --  184*  --  187*  --   --   --   --   --  195*  BUN 77*   < > 51*  --  46*  --   --   --  42*  --  41*  --   --   --   --   --  42*  CREATININE 4.74*   < > 3.37*  --  3.17*  --   --   --  3.14*  --  2.97*  --   --   --   --   --  2.79*  CALCIUM 8.6*   < > 9.2  --  9.4  --   --   --  9.6  --  9.5  --   --   --   --   --  9.2  MG 2.4  --  2.2  --   --   --  2.1  --  2.3  --   --   --   --   --   --   --  2.1  PHOS 5.8*   < > 3.1  --  2.9  --   --   --  3.4  --  3.5  --   --    --   --   --  2.3*   < > = values in this interval not displayed.    Liver Function Tests: Recent Labs  Lab 08/13/18 0420 08/13/18 1529 08/14/18 0351 08/14/18 1600 08/15/18 0453  ALBUMIN 2.1* 2.2* 2.5* 2.5* 2.3*   No results for input(s): LIPASE, AMYLASE in the last 168 hours. No results for input(s): AMMONIA in the last 168 hours.  CBC: Recent Labs  Lab 08/10/18 0220  08/12/18 0330  08/13/18 0420  08/14/18 0351  08/14/18 1020  08/14/18 1615 08/14/18 1812 08/14/18 1815 08/14/18 1954 08/15/18 0453  WBC 6.0  --  3.1*  --  2.9*  --  2.5*  --  1.9*  --   --   --   --   --  1.4*  NEUTROABS 4.9  --   --   --  1.8  --   --   --   --   --   --   --   --   --   --   HGB 10.0*   < > 8.1*   < > 8.5*   < > 8.8*   < > 8.3*   < > 10.2* 8.2* 10.2* 10.5* 8.2*  HCT 31.1*   < > 25.6*   < > 26.4*   < > 28.3*   < > 27.5*   < > 30.0* 24.0* 30.0* 31.0* 27.5*  MCV 93.7  --  91.8  --  92.0  --  92.8  --  93.2  --   --   --   --   --  94.2  PLT 133*  --  122*  --  137*  --  151  --  150  --   --   --   --   --  170   < > = values in this interval not displayed.    Cardiac Enzymes: No results for input(s): CKTOTAL, CKMB, CKMBINDEX, TROPONINI in the last 168 hours.  BNP: Invalid input(s): POCBNP  CBG: Recent Labs  Lab 08/14/18 1614 08/14/18 1950 08/14/18 2357 08/15/18 0445 08/15/18 0824  GLUCAP 182* 184* 161* 186* 161*    Microbiology: Results for orders placed or performed during the hospital encounter of 08/23/2018  MRSA PCR Screening     Status: None   Collection Time: 07/30/18  2:30 AM  Result Value Ref Range Status   MRSA by PCR NEGATIVE NEGATIVE Final    Comment:        The GeneXpert MRSA Assay (FDA approved for NASAL specimens only), is one component of a comprehensive MRSA colonization surveillance program. It is not intended to diagnose MRSA infection nor to guide or monitor treatment for MRSA infections. Performed at [facility], [place]  [address] [place], [place] [address]   Culture, blood (routine x 2)     Status: None   Collection Time: 08/09/18  3:46 PM  Result Value Ref Range Status   Specimen Description   Final    BLOOD RIGHT HAND Performed at [facility] [address] [place], [place] [address]    Special Requests   Final    BOTTLES DRAWN AEROBIC ONLY Blood Culture adequate volume Performed at [facility] [address], [place], [place] [address]    Culture   Final    NO GROWTH 5 DAYS Performed at [facility], [place] [address] [place], [place] [address]    Report Status 08/14/2018 FINAL  Final    Coagulation Studies: No results for input(s): LABPROT, INR in the last 72 hours.  Urinalysis: No results for input(s): COLORURINE, LABSPEC, PHURINE, GLUCOSEU, HGBUR, BILIRUBINUR, KETONESUR, PROTEINUR, UROBILINOGEN, NITRITE, LEUKOCYTESUR in the last 72 hours.  Invalid input(s): APPERANCEUR    Imaging: No results found.   Medications:   .  prismasol BGK 4/2.5 500 mL/hr at 08/14/18 2146  . sodium chloride Stopped (08/10/18 0653)  . amiodarone 30 mg/hr (08/15/18 0800)  . anticoagulant sodium citrate    . calcium gluconate infusion for CRRT 20 g (08/15/18 0327)  . dexmedetomidine (PRECEDEX) IV infusion 0.4 mcg/kg/hr (08/15/18 0800)  . feeding supplement (VITAL HIGH PROTEIN) 30 mL/hr at 08/14/18 2203  . fentaNYL infusion INTRAVENOUS 150 mcg/hr (08/15/18 0800)  . heparin 2,700 Units/hr (08/15/18 0800)  . norepinephrine (LEVOPHED) Adult infusion 5 mcg/min (08/15/18 0800)  . prismasol B22GK 4/0    . sodium citrate 2 %/dextrose 2.5% solution 3000 mL 430 mL/hr at 08/15/18 0400   . chlorhexidine gluconate (MEDLINE KIT)  15 mL Mouth Rinse BID  . Chlorhexidine Gluconate Cloth  6 each Topical Daily  . feeding supplement (PRO-STAT SUGAR FREE 64)  60 mL Per Tube TID  . insulin aspart  0-9 Units Subcutaneous Q4H  . mouth rinse  15 mL Mouth Rinse 10 times per day  .  metoprolol tartrate  25 mg Oral BID  . pantoprazole (PROTONIX) IV  40 mg Intravenous Q24H  . sodium chloride flush  10-40 mL Intracatheter Q12H   Place/Maintain arterial line **AND** sodium chloride, acetaminophen (TYLENOL) oral liquid 160 mg/5 mL, albuterol, anticoagulant sodium citrate, fentaNYL, hydrALAZINE,  lip balm, midazolam, [DISCONTINUED] ondansetron **OR** ondansetron (ZOFRAN) IV, sodium chloride flush

## 2018-08-15 NOTE — Progress Notes (Signed)
ANTICOAGULATION CONSULT NOTE  Pharmacy Consult for IV heparin Indication: positive COVID patient on CRRT needs systemic anticoagulation, a-fib coverage   No Known Allergies  Patient Measurements: Height: 5\' 10"  (177.8 cm) Weight: 278 lb 3.5 oz (126.2 kg) IBW/kg (Calculated) : 73 Heparin Dosing Weight: 104 kg  Vital Signs: Temp: 98.4 F (36.9 C) (04/18 0400) Temp Source: Oral (04/18 0400) Pulse Rate: 111 (04/18 0500)  Labs: Recent Labs    08/14/18 0206  08/14/18 0351  08/14/18 1020  08/14/18 1600  08/14/18 1815 08/14/18 1954 08/15/18 0453  HGB 9.2*   < > 8.8*   < > 8.3*   < >  --    < > 10.2* 10.5* 8.2*  HCT 27.0*   < > 28.3*   < > 27.5*   < >  --    < > 30.0* 31.0* 27.5*  PLT  --   --  151  --  150  --   --   --   --   --  170  APTT 62*  --   --   --  79*  --   --   --   --   --  64*  HEPARINUNFRC 0.17*  --   --   --  0.24*  --   --   --   --   --  0.22*  CREATININE  --   --  3.14*  --   --   --  2.97*  --   --   --  2.79*   < > = values in this interval not displayed.    Estimated Creatinine Clearance: 41.3 mL/min (A) (by C-G formula based on SCr of 2.79 mg/dL (H)).   Assessment: 52 y/oM to start heparin infusion per pharmacy. Positive COVID patient on CRRT needs systemic anticoagulation per CCM request. Also with a-fib. Patient previously on systemic heparin, but was stopped after patient developed hematochezia. However, dialysis catheter has continuously clotted off, neccesitating the restart of IV heparin.   Today, 08/15/18  APTT this AM is slightly subtherapeutic again at 64sec but given positive FOB and no current problems with CRRT, will not change rate  HL continues to be SUBtherapeutic but per discussion with [facility] pharmacist, this seems to be happening with COVID positive patients on CRRT and if aPTT is therapeutic and CRRT is running appropriately then will continue same IV heparin rate in this case  Hgb = 8.2 dropping but Hgb continues to jump around quite a  bit while on CRRT - range last 2 days 7.8-10.9  RN reports IV heparin running through port on HD catheter.  Note that fecal occult blood from 4/17 was positive - will await further decisions on heparin per hospitalist and CCM teams  Reports no problems with CRRT now.   Heparin level drawn through PICC line. No interruptions in therapy reported by RN.   Goal of Therapy:  Heparin level 0.3-0.7 units/ml, aPTT 66-102sec Monitor platelets by anticoagulation protocol: Yes   Plan:   Continue heparin drip at 2700 units/hr  F/u on FOB+ - decision per primary service/CCM  Will recheck aPTT today at 1600 with renal function panel to limit RN going into +COVID room  Daily aPTT/HL/CBC while on heparin infusion.   Monitor closely for signs and symptoms of bleeding  Kara Mead 08/15/2018, 8:00 AM

## 2018-08-15 NOTE — Progress Notes (Signed)
PROGRESS NOTE    [name] Long  YOF:[id number] DOB: 10-Apr-1966 DOA: 08/06/2018 PCP: Patient, No Pcp Per    Brief Narrative:  53 year old gentleman with a history of type 2 diabetes, hypertension, obstructive sleep apnea on CPAP who remains in the hospital since 08/25/2018. He was initially admitted to [facility] emergency room with many days of abdominal pain and recurrent fevers.  He had acute sigmoid diverticulitis with localized peritonitis and perforation along with COVID-19 pneumonia.  Initially he was noted to have mild hypoxemia on admission, he subsequently deteriorated needing mechanical ventilation.  He has been suffering from multiple problems since then.  Remains critically ill, now on vent, CRRT. Rapid Afib.  Assessment & Plan:   Principal Problem:   ARDS (adult respiratory distress syndrome) (HCC) Active Problems:   COVID-19 virus infection   Diverticulitis of colon with perforation   HTN (hypertension)   DM2 (diabetes mellitus, type 2) (HCC)   Neutropenia (HCC)   Thrombocytopenia (HCC)   Pressure injury of skin  Acute respiratory distress syndrome due to COVID-19 infection: Patient remains on mechanical ventilation with difficult to wean from the ventilator.  Likely needing tracheostomy for ongoing ventilator support because of underlying morbid obesity and sleep apnea.  Followed by critical care. Continue current mechanical ventilation as per critical care. He was not a candidate for Tocilizumab because of acute diverticulitis.  Acute kidney injury: Followed by nephrology.  Currently remains on CRRT.  Perforated sigmoid diverticulum: Localized diverticulum.  Patient was followed by GI and surgery.  Signed off.  Patient does have some bowel function.  Tolerated trickle feeding. He had a bowel movement. Patient has bowel function.  We increase his tube feeding and he has been tolerating well. With neutropenia, will discontinue Zosyn.  paroxysmal atrial fibrillation: Heart  rate control is fluctuating.  Patient is on amiodarone drip.  He is also on heparin drip.  Type 2 diabetes: Remains on insulin and well controlled.   Anemia of due to acute illness: Hb ranges from 9-11-8.8 with no evidence of active bleeding.  We will continue monitoring.  Currently no indication for transfusion.  DVT prophylaxis: Patient on heparin drip. Code Status: No CPR.  Okay for defibrillation. Family Communication: wife.  Communicated by critical care. Disposition Plan: ICU   Consultants:   PCCM  Surgery, signed off  GI , signed off  Renal , CRRT  Procedures:  ETT 4/2 >>  L IJ HD 4/6 >> 4/12 R Rad Aline 4/6 >>  4/12 right femoral HD catheter 24 cm>>  Antimicrobials:   Zosyn 4/1-4/7  Zosyn 4/12--- 08/15/2018   Subjective: Patient seen and examined. He is sedated.  No overnight events.  Heart rate control remains a problem. He is getting CRRT with no fluid removal today. Had multiple bowel movements, now has FMS.   Objective: Vitals:   08/15/18 0830 08/15/18 0852 08/15/18 0856 08/15/18 0955  BP:      Pulse: (!) 111 (!) 111 (!) 116 (!) 119  Resp: 10 (!) 9 (!) 9 17  Temp:      TempSrc:      SpO2: 100% 100% 99% 100%  Weight:      Height:        Intake/Output Summary (Last 24 hours) at 08/15/2018 1017 Last data filed at 08/15/2018 0910 Gross per 24 hour  Intake 3539.15 ml  Output 5297 ml  Net -1757.85 ml   Filed Weights   08/13/18 0600 08/14/18 0554 08/15/18 0500  Weight: 130 kg 126.7 kg 126.2 kg  Examination:  General: Morbidly obese male who is sedated on full mechanical ventilatory support.  Currently on 40% FiO2. HEENT: Pupils equal reactive.  No JVD or lymphadenopathy is appreciated Neuro: Opens eyes to strong stimuli. CV: Heart sounds irregular irregular with poorly controlled ventricular rate in the setting of atrial fibrillation PULM: clear air entry. decreased breath sounds in the bases GI: Abdomen is obese and pendulous.  Bowel  sounds present.  No rigidity or guarding. Extremities: Warm and dry, 2+ edema, anasarca. Skin: Warm to touch Fecal management system with small amount of loose stool.    Data Reviewed: I have personally reviewed following labs and imaging studies  CBC: Recent Labs  Lab 08/10/18 0220  08/12/18 0330  08/13/18 0420  08/14/18 0351  08/14/18 1020  08/14/18 1615 08/14/18 1812 08/14/18 1815 08/14/18 1954 08/15/18 0453  WBC 6.0  --  3.1*  --  2.9*  --  2.5*  --  1.9*  --   --   --   --   --  1.4*  NEUTROABS 4.9  --   --   --  1.8  --   --   --   --   --   --   --   --   --   --   HGB 10.0*   < > 8.1*   < > 8.5*   < > 8.8*   < > 8.3*   < > 10.2* 8.2* 10.2* 10.5* 8.2*  HCT 31.1*   < > 25.6*   < > 26.4*   < > 28.3*   < > 27.5*   < > 30.0* 24.0* 30.0* 31.0* 27.5*  MCV 93.7  --  91.8  --  92.0  --  92.8  --  93.2  --   --   --   --   --  94.2  PLT 133*  --  122*  --  137*  --  151  --  150  --   --   --   --   --  170   < > = values in this interval not displayed.   Basic Metabolic Panel: Recent Labs  Lab 08/12/18 0330  08/13/18 0420  08/13/18 1529  08/13/18 2209  08/14/18 0351  08/14/18 1600  08/14/18 1615 08/14/18 1812 08/14/18 1815 08/14/18 1954 08/15/18 0453  NA 134*   < > 136   < > 137   < >  --    < > 136   < > 136   < > 135 135 138 137 136  K 4.8   < > 3.9   < > 3.8   < >  --    < > 3.9   < > 3.9   < > 4.0 3.9 3.6 3.8 3.7  CL 96*   < > 94*  --  93*  --   --   --  92*  --  92*  --   --   --   --   --  92*  CO2 25   < > 29  --  31  --   --   --  29  --  31  --   --   --   --   --  31  GLUCOSE 134*   < > 161*  --  156*  --   --   --  184*  --  187*  --   --   --   --   --  195*  BUN 77*   < > 51*  --  46*  --   --   --  42*  --  41*  --   --   --   --   --  42*  CREATININE 4.74*   < > 3.37*  --  3.17*  --   --   --  3.14*  --  2.97*  --   --   --   --   --  2.79*  CALCIUM 8.6*   < > 9.2  --  9.4  --   --   --  9.6  --  9.5  --   --   --   --   --  9.2  MG 2.4  --  2.2  --    --   --  2.1  --  2.3  --   --   --   --   --   --   --  2.1  PHOS 5.8*   < > 3.1  --  2.9  --   --   --  3.4  --  3.5  --   --   --   --   --  2.3*   < > = values in this interval not displayed.   GFR: Estimated Creatinine Clearance: 41.3 mL/min (A) (by C-G formula based on SCr of 2.79 mg/dL (H)). Liver Function Tests: Recent Labs  Lab 08/13/18 0420 08/13/18 1529 08/14/18 0351 08/14/18 1600 08/15/18 0453  ALBUMIN 2.1* 2.2* 2.5* 2.5* 2.3*   No results for input(s): LIPASE, AMYLASE in the last 168 hours. No results for input(s): AMMONIA in the last 168 hours. Coagulation Profile: No results for input(s): INR, PROTIME in the last 168 hours. Cardiac Enzymes: No results for input(s): CKTOTAL, CKMB, CKMBINDEX, TROPONINI in the last 168 hours. BNP (last 3 results) No results for input(s): PROBNP in the last 8760 hours. HbA1C: No results for input(s): HGBA1C in the last 72 hours. CBG: Recent Labs  Lab 08/14/18 1614 08/14/18 1950 08/14/18 2357 08/15/18 0445 08/15/18 0824  GLUCAP 182* 184* 161* 186* 161*   Lipid Profile: No results for input(s): CHOL, HDL, LDLCALC, TRIG, CHOLHDL, LDLDIRECT in the last 72 hours. Thyroid Function Tests: No results for input(s): TSH, T4TOTAL, FREET4, T3FREE, THYROIDAB in the last 72 hours. Anemia Panel: No results for input(s): VITAMINB12, FOLATE, FERRITIN, TIBC, IRON, RETICCTPCT in the last 72 hours. Sepsis Labs: Recent Labs  Lab 08/08/18 1102 08/08/18 1402  LATICACIDVEN 1.0 1.0    Recent Results (from the past 240 hour(s))  Culture, blood (routine x 2)     Status: None   Collection Time: 08/09/18  3:46 PM  Result Value Ref Range Status   Specimen Description   Final    BLOOD RIGHT HAND Performed at [facility] [address] [place], [place] [address]    Special Requests   Final    BOTTLES DRAWN AEROBIC ONLY Blood Culture adequate volume Performed at [facility] [address]  [place], [place] [address]    Culture   Final    NO GROWTH 5 DAYS Performed at [facility], [place] [address], [place], [place] [address]    Report Status 08/14/2018 FINAL  Final         Radiology Studies: No results found.      Scheduled Meds: . chlorhexidine gluconate (MEDLINE KIT)  15 mL Mouth Rinse BID  . Chlorhexidine Gluconate Cloth  6 each Topical Daily  . feeding supplement (PRO-STAT SUGAR FREE 64)  60 mL Per Tube TID  . insulin aspart  0-9 Units Subcutaneous Q4H  . mouth rinse  15 mL Mouth Rinse 10 times per day  . metoprolol tartrate  25 mg Oral BID  . pantoprazole (PROTONIX) IV  40 mg Intravenous Q24H  . sodium chloride flush  10-40 mL Intracatheter Q12H   Continuous Infusions: .  prismasol BGK 4/2.5 500 mL/hr at 08/15/18 0928  . sodium chloride Stopped (08/10/18 0653)  . amiodarone 30 mg/hr (08/15/18 0910)  . anticoagulant sodium citrate    . calcium gluconate infusion for CRRT 20 g (08/15/18 0327)  . dexmedetomidine (PRECEDEX) IV infusion 0.4 mcg/kg/hr (08/15/18 0910)  . feeding supplement (VITAL HIGH PROTEIN) 30 mL/hr at 08/14/18 2203  . fentaNYL infusion INTRAVENOUS 75 mcg/hr (08/15/18 0910)  . heparin 2,700 Units/hr (08/15/18 0910)  . norepinephrine (LEVOPHED) Adult infusion Stopped (08/15/18 0906)  . prismasol B22GK 4/0    . sodium citrate 2 %/dextrose 2.5% solution 3000 mL 430 mL/hr at 08/15/18 0400     LOS: 17 days    Time spent: 25 minutes    Barb Merino, MD [facility] Pager [phone]  If 7PM-7AM, please contact night-coverage [email or web address] Password [facility] 08/15/2018, 10:17 AM

## 2018-08-16 ENCOUNTER — Inpatient Hospital Stay (HOSPITAL_COMMUNITY): Payer: BLUE CROSS/BLUE SHIELD

## 2018-08-16 LAB — CBC
HCT: 26.5 % — ABNORMAL LOW (ref 39.0–52.0)
Hemoglobin: 8.2 g/dL — ABNORMAL LOW (ref 13.0–17.0)
MCH: 28.9 pg (ref 26.0–34.0)
MCHC: 30.9 g/dL (ref 30.0–36.0)
MCV: 93.3 fL (ref 80.0–100.0)
Platelets: 154 10*3/uL (ref 150–400)
RBC: 2.84 MIL/uL — ABNORMAL LOW (ref 4.22–5.81)
RDW: 19 % — ABNORMAL HIGH (ref 11.5–15.5)
WBC: 1.3 10*3/uL — CL (ref 4.0–10.5)
nRBC: 1.6 % — ABNORMAL HIGH (ref 0.0–0.2)

## 2018-08-16 LAB — GLUCOSE, CAPILLARY
Glucose-Capillary: 173 mg/dL — ABNORMAL HIGH (ref 70–99)
Glucose-Capillary: 169 mg/dL — ABNORMAL HIGH (ref 70–99)
Glucose-Capillary: 213 mg/dL — ABNORMAL HIGH (ref 70–99)
Glucose-Capillary: 236 mg/dL — ABNORMAL HIGH (ref 70–99)
Glucose-Capillary: 164 mg/dL — ABNORMAL HIGH (ref 70–99)
Glucose-Capillary: 135 mg/dL — ABNORMAL HIGH (ref 70–99)
Glucose-Capillary: 156 mg/dL — ABNORMAL HIGH (ref 70–99)

## 2018-08-16 LAB — RENAL FUNCTION PANEL
Albumin: 2.4 g/dL — ABNORMAL LOW (ref 3.5–5.0)
Anion gap: 12 (ref 5–15)
BUN: 46 mg/dL — ABNORMAL HIGH (ref 6–20)
CO2: 28 mmol/L (ref 22–32)
Calcium: 7.6 mg/dL — ABNORMAL LOW (ref 8.9–10.3)
Chloride: 94 mmol/L — ABNORMAL LOW (ref 98–111)
Creatinine, Ser: 3.13 mg/dL — ABNORMAL HIGH (ref 0.61–1.24)
GFR calc Af Amer: 25 mL/min — ABNORMAL LOW (ref >60)
GFR calc non Af Amer: 22 mL/min — ABNORMAL LOW (ref >60)
Glucose, Bld: 177 mg/dL — ABNORMAL HIGH (ref 70–99)
Phosphorus: 2 mg/dL — ABNORMAL LOW (ref 2.5–4.6)
Potassium: 3.6 mmol/L (ref 3.5–5.1)
Sodium: 134 mmol/L — ABNORMAL LOW (ref 135–145)

## 2018-08-16 LAB — POCT I-STAT EG7
Acid-base deficit: 1 mmol/L (ref 0.0–2.0)
Bicarbonate: 25.7 mmol/L (ref 20.0–28.0)
Calcium, Ion: <0.3 mmol/L — CL (ref 1.15–1.40)
HCT: 25 % — ABNORMAL LOW (ref 39.0–52.0)
Hemoglobin: 8.5 g/dL — ABNORMAL LOW (ref 13.0–17.0)
O2 Saturation: 54 %
Patient temperature: 99.5
Potassium: 3.5 mmol/L (ref 3.5–5.1)
Sodium: 139 mmol/L (ref 135–145)
TCO2: 27 mmol/L (ref 22–32)
pCO2, Ven: 50.3 mmHg (ref 44.0–60.0)
pH, Ven: 7.318 (ref 7.250–7.430)
pO2, Ven: 32 mmHg (ref 32.0–45.0)

## 2018-08-16 LAB — APTT
aPTT: 58 seconds — ABNORMAL HIGH (ref 24–36)
aPTT: 68 seconds — ABNORMAL HIGH (ref 24–36)

## 2018-08-16 LAB — MAGNESIUM: Magnesium: 2.1 mg/dL (ref 1.7–2.4)

## 2018-08-16 LAB — CALCIUM, IONIZED: Calcium, Ionized, Serum: 4.7 mg/dL (ref 4.5–5.6)

## 2018-08-16 LAB — HEPARIN LEVEL (UNFRACTIONATED): Heparin Unfractionated: 0.12 IU/mL — ABNORMAL LOW (ref 0.30–0.70)

## 2018-08-16 LAB — SARS CORONAVIRUS 2 BY RT PCR (HOSPITAL ORDER, PERFORMED IN ~~LOC~~ HOSPITAL LAB): SARS Coronavirus 2: POSITIVE — AB

## 2018-08-16 MED ORDER — PIPERACILLIN-TAZOBACTAM IN DEX 2-0.25 GM/50ML IV SOLN
2.2500 g | Freq: Four times a day (QID) | INTRAVENOUS | Status: DC
  Administered 2018-08-16 – 2018-08-18 (×7): 2.25 g via INTRAVENOUS
  Filled 2018-08-16 (×8): qty 50

## 2018-08-16 MED ORDER — MIDAZOLAM HCL (PF) 5 MG/ML IJ SOLN: 1.0000 mg | INTRAMUSCULAR | Status: DC | PRN

## 2018-08-16 MED ORDER — MIDAZOLAM HCL 2 MG/2ML IJ SOLN
1.0000 mg | INTRAMUSCULAR | Status: DC | PRN
  Administered 2018-08-16 (×2): 1 mg via INTRAVENOUS
  Administered 2018-08-16 – 2018-08-19 (×5): 4 mg via INTRAVENOUS
  Administered 2018-08-21 (×2): 2 mg via INTRAVENOUS
  Filled 2018-08-16: qty 2
  Filled 2018-08-16 (×2): qty 4
  Filled 2018-08-16: qty 2

## 2018-08-16 MED ORDER — MIDAZOLAM 50MG/50ML (1MG/ML) PREMIX INFUSION
0.5000 mg/h | INTRAVENOUS | Status: DC
  Administered 2018-08-16: 23:00:00 9 mg/h via INTRAVENOUS
  Administered 2018-08-16 (×2): 0.5 mg/h via INTRAVENOUS
  Administered 2018-08-17 (×3): 10 mg/h via INTRAVENOUS
  Administered 2018-08-18: 8 mg/h via INTRAVENOUS
  Administered 2018-08-18: 10 mg/h via INTRAVENOUS
  Administered 2018-08-19: 08:00:00 8 mg/h via INTRAVENOUS
  Administered 2018-08-20 – 2018-08-22 (×4): 2 mg/h via INTRAVENOUS
  Filled 2018-08-16 (×21): qty 50

## 2018-08-16 MED ORDER — SODIUM CHLORIDE 0.9 % IV SOLN
1.0000 mg/h | INTRAVENOUS | Status: DC
  Administered 2018-08-16: 17:00:00 1 mg/h via INTRAVENOUS
  Administered 2018-08-17: 4 mg/h via INTRAVENOUS
  Administered 2018-08-17: 3 mg/h via INTRAVENOUS
  Administered 2018-08-18: 4 mg/h via INTRAVENOUS
  Administered 2018-08-20: 2 mg/h via INTRAVENOUS
  Administered 2018-08-22: 4 mg/h via INTRAVENOUS
  Filled 2018-08-16 (×8): qty 5

## 2018-08-16 MED ORDER — DEXTROSE 5 % IV SOLN
20.0000 g | INTRAVENOUS | Status: DC
  Administered 2018-08-18 – 2018-08-21 (×3): 20 g via INTRAVENOUS_CENTRAL
  Filled 2018-08-16 (×11): qty 200

## 2018-08-16 MED ORDER — VANCOMYCIN HCL IN DEXTROSE 1-5 GM/200ML-% IV SOLN
1000.0000 mg | INTRAVENOUS | Status: DC
  Administered 2018-08-17 – 2018-08-19 (×3): 1000 mg via INTRAVENOUS
  Filled 2018-08-16 (×3): qty 200

## 2018-08-16 MED ORDER — VANCOMYCIN HCL 10 G IV SOLR
2000.0000 mg | Freq: Once | INTRAVENOUS | Status: AC
  Administered 2018-08-16: 2000 mg via INTRAVENOUS
  Filled 2018-08-16: qty 2000

## 2018-08-16 MED ORDER — HEPARIN (PORCINE) 25000 UT/250ML-% IV SOLN
2900.0000 [IU]/h | INTRAVENOUS | Status: DC
  Administered 2018-08-17: 2900 [IU]/h via INTRAVENOUS
  Filled 2018-08-16 (×2): qty 250

## 2018-08-16 NOTE — Progress Notes (Signed)
CRITICAL VALUE ALERT  Critical Value:  WBC 1.3  Date & Time Notied:  08/16/2018  Provider Notified: Dr. Sloan Leiter  Orders Received/Actions taken: No new orders at this time.

## 2018-08-16 NOTE — Progress Notes (Signed)
PROGRESS NOTE    [name] Long  OFB:[id number] DOB: 11-09-1965 DOA: 08/25/2018 PCP: Patient, No Pcp Per    Brief Narrative:  53 year old gentleman with a history of type 2 diabetes, hypertension, obstructive sleep apnea on CPAP who remains in the hospital since 08/27/2018. He was initially went to [facility] emergency room with many days of abdominal pain and recurrent fevers.  He had acute sigmoid diverticulitis with localized peritonitis and perforation along with COVID-19 pneumonia.  Initially he was noted to have mild hypoxemia on admission, he subsequently deteriorated needing mechanical ventilation.  He has been suffering from multiple problems since then.  Remains critically ill, now on vent, CRRT. Rapid Afib. Developing leucopenia, hypotension.  Assessment & Plan:   Principal Problem:   ARDS (adult respiratory distress syndrome) (HCC) Active Problems:   COVID-19 virus infection   Diverticulitis of colon with perforation   HTN (hypertension)   DM2 (diabetes mellitus, type 2) (HCC)   Neutropenia (HCC)   Thrombocytopenia (HCC)   Pressure injury of skin  ARDS due to COVID-19 infection: Patient remains on mechanical ventilation with difficult to wean from the ventilator.  Likely needing tracheostomy for ongoing ventilator support if able to survive .  Followed by critical care. Continue current mechanical ventilation as per critical care. He was not a candidate for Tocilizumab because of acute diverticulitis. He was treated with antibiotics and currently off.   Acute kidney injury: Followed by nephrology.  Currently remains on CRRT.   Hypocalcemia: receiving replacement.  Perforated sigmoid diverticulum: Localized diverticulum.  Patient was followed by GI and surgery.  Signed off.  Patient does have some bowel function.  Tolerating tube feeding. Advance as tolerated.   paroxysmal atrial fibrillation: Heart rate control is fluctuating.  Patient is on amiodarone drip.  He is also on  heparin drip.  Type 2 diabetes: Remains on insulin and well controlled.   Anemia of due to acute illness: Hb ranges from 9-11-8.8-8.2 with no evidence of active bleeding.  We will continue monitoring.  Currently no indication for transfusion.  Hypotension, leukopenia: Patient continues to have leukopenia.  Now with low-grade fever.  Will re-culture today.  Patient is currently needing Levophed for blood pressure support.   DVT prophylaxis: Patient on heparin drip. Code Status: No CPR.  Okay for intubation. Family Communication: We will discuss with family, patient's wife along with critical care team.  His prognosis remains very poor with no signs of improvement and decompensation. Disposition Plan: ICU   Consultants:   PCCM  Surgery, signed off  GI , signed off  Renal , CRRT  Procedures:  ETT 4/2 >>  L IJ HD 4/6 >> 4/12 R Rad Aline 4/6 >>  4/12 right femoral HD catheter 24 cm>>  Antimicrobials:   Zosyn 4/1-4/7  Zosyn 4/12--- 08/15/2018   Subjective: Patient seen and examined.  Patient remains agitated with unpurposeful movements.  Not really following commands. Sedated on ventilator.  Temperature maximum 101. WBC count is 1.3.  Objective: Vitals:   08/16/18 0600 08/16/18 0700 08/16/18 0800 08/16/18 0900  BP:  (!) 108/55 132/75   Pulse: (!) 107 97 (!) 105 98  Resp: _0 Temp:   99.5 F (37.5 C)   TempSrc:   Axillary   SpO2: 100% 99% 100% 100%  Weight:      Height:        Intake/Output Summary (Last 24 hours) at 08/16/2018 1018 Last data filed at 08/16/2018 1000 Gross per 24 hour  Intake 4698.73 ml  Output 2520 ml  Net 2178.73 ml   Filed Weights   08/14/18 0554 08/15/18 0500 08/16/18 0500  Weight: 126.7 kg 126.2 kg 125.4 kg    Examination:  General: Morbidly obese male who is sedated on full mechanical ventilatory support.  Currently on 40% FiO2.  Sick looking. HEENT: Pupils equal and sluggish reactive.  No JVD or lymphadenopathy is  appreciated Neuro: Opens eyes to strong stimuli, however not following any commands. CV: Heart sounds irregular irregular with poorly controlled ventricular rate in the setting of atrial fibrillation PULM:  Bilateral poor air entry.  Mostly conducted airway sounds. GI: Abdomen is obese and pendulous.  Bowel sounds present.  No rigidity or guarding. Extremities: Warm and dry, 2+ edema, anasarca. Skin: Warm to touch Fecal management system with loose stool.    Data Reviewed: I have personally reviewed following labs and imaging studies  CBC: Recent Labs  Lab 08/10/18 0220  08/13/18 0420  08/14/18 0351  08/14/18 1020  08/14/18 1815 08/14/18 1954 08/15/18 0453 08/15/18 2329 08/16/18 0428  WBC 6.0   < > 2.9*  --  2.5*  --  1.9*  --   --   --  1.4*  --  1.3*  NEUTROABS 4.9  --  1.8  --   --   --   --   --   --   --   --   --   --   HGB 10.0*   < > 8.5*   < > 8.8*   < > 8.3*   < > 10.2* 10.5* 8.2* 8.5* 8.2*  HCT 31.1*   < > 26.4*   < > 28.3*   < > 27.5*   < > 30.0* 31.0* 27.5* 25.0* 26.5*  MCV 93.7   < > 92.0  --  92.8  --  93.2  --   --   --  94.2  --  93.3  PLT 133*   < > 137*  --  151  --  150  --   --   --  170  --  154   < > = values in this interval not displayed.   Basic Metabolic Panel: Recent Labs  Lab 08/13/18 0420  08/13/18 2209  08/14/18 0351  08/14/18 1600  08/14/18 1954 08/15/18 0453 08/15/18 1600 08/15/18 2329 08/16/18 0428  NA 136   < >  --    < > 136   < > 136   < > 137 136 137 139 134*  K 3.9   < >  --    < > 3.9   < > 3.9   < > 3.8 3.7 3.8 3.5 3.6  CL 94*   < >  --   --  92*  --  92*  --   --  92* 94*  --  94*  CO2 29   < >  --   --  29  --  31  --   --  31 29  --  28  GLUCOSE 161*   < >  --   --  184*  --  187*  --   --  195* 209*  --  177*  BUN 51*   < >  --   --  42*  --  41*  --   --  42* 43*  --  46*  CREATININE 3.37*   < >  --   --  3.14*  --  2.97*  --   --  2.79* 3.05*  --  3.13*  CALCIUM 9.2   < >  --   --  9.6  --  9.5  --   --  9.2 7.8*  --   7.6*  MG 2.2  --  2.1  --  2.3  --   --   --   --  2.1  --   --  2.1  PHOS 3.1   < >  --   --  3.4  --  3.5  --   --  2.3* 2.1*  --  2.0*   < > = values in this interval not displayed.   GFR: Estimated Creatinine Clearance: 36.7 mL/min (A) (by C-G formula based on SCr of 3.13 mg/dL (H)). Liver Function Tests: Recent Labs  Lab 08/14/18 0351 08/14/18 1600 08/15/18 0453 08/15/18 1600 08/16/18 0428  ALBUMIN 2.5* 2.5* 2.3* 2.1* 2.4*   No results for input(s): LIPASE, AMYLASE in the last 168 hours. No results for input(s): AMMONIA in the last 168 hours. Coagulation Profile: No results for input(s): INR, PROTIME in the last 168 hours. Cardiac Enzymes: No results for input(s): CKTOTAL, CKMB, CKMBINDEX, TROPONINI in the last 168 hours. BNP (last 3 results) No results for input(s): PROBNP in the last 8760 hours. HbA1C: No results for input(s): HGBA1C in the last 72 hours. CBG: Recent Labs  Lab 08/15/18 1700 08/15/18 2010 08/15/18 2308 08/16/18 0413 08/16/18 0750  GLUCAP 205* 183* 173* 169* 213*   Lipid Profile: No results for input(s): CHOL, HDL, LDLCALC, TRIG, CHOLHDL, LDLDIRECT in the last 72 hours. Thyroid Function Tests: No results for input(s): TSH, T4TOTAL, FREET4, T3FREE, THYROIDAB in the last 72 hours. Anemia Panel: No results for input(s): VITAMINB12, FOLATE, FERRITIN, TIBC, IRON, RETICCTPCT in the last 72 hours. Sepsis Labs: No results for input(s): PROCALCITON, LATICACIDVEN in the last 168 hours.  Recent Results (from the past 240 hour(s))  Culture, blood (routine x 2)     Status: None   Collection Time: 08/09/18  3:46 PM  Result Value Ref Range Status   Specimen Description   Final    BLOOD RIGHT HAND Performed at [facility] [address] [place], [place] [address]    Special Requests   Final    BOTTLES DRAWN AEROBIC ONLY Blood Culture adequate volume Performed at [facility] [address] [place], [place]  [address]    Culture   Final    NO GROWTH 5 DAYS Performed at [facility], [place] [address], [place], [place] [address]    Report Status 08/14/2018 FINAL  Final         Radiology Studies: Dg Chest Port 1 View  Result Date: 08/16/2018 CLINICAL DATA:  Per order- acute respiratory failure PT HX: DM, ex smoker, HTN EXAM: PORTABLE CHEST 1 VIEW COMPARISON:  Radiograph 08/12/2018 FINDINGS: Endotracheal tube and NG tube unchanged. Stable moderately large cardiac silhouette. Mild diffuse pulmonary parenchymal opacities unchanged from comparison exam. IMPRESSION: 1. Stable support apparatus. 2. No interval change. 3. Diffuse mild pulmonary parenchymal opacities again noted. Electronically Signed   By: Suzy Bouchard M.D.   On: 08/16/2018 06:10        Scheduled Meds: . chlorhexidine gluconate (MEDLINE KIT)  15 mL Mouth Rinse BID  . Chlorhexidine Gluconate Cloth  6 each Topical Daily  . feeding supplement (PRO-STAT SUGAR FREE 64)  60 mL Per Tube TID  . insulin aspart  0-9 Units Subcutaneous Q4H  . mouth rinse  15 mL Mouth  Rinse 10 times per day  . metoprolol tartrate  25 mg Oral BID  . pantoprazole (PROTONIX) IV  40 mg Intravenous Q24H  . sodium chloride flush  10-40 mL Intracatheter Q12H   Continuous Infusions: .  prismasol BGK 4/2.5 400 mL/hr at 08/16/18 0949  . sodium chloride Stopped (08/10/18 0653)  . amiodarone 30 mg/hr (08/16/18 1000)  . anticoagulant sodium citrate    . calcium gluconate infusion for CRRT 70 mL/hr at 08/16/18 1000  . dexmedetomidine (PRECEDEX) IV infusion 1 mcg/kg/hr (08/16/18 1000)  . feeding supplement (VITAL HIGH PROTEIN) 1,000 mL (08/15/18 2329)  . fentaNYL infusion INTRAVENOUS 200 mcg/hr (08/16/18 1000)  . heparin 2,900 Units/hr (08/16/18 0759)  . norepinephrine (LEVOPHED) Adult infusion 4 mcg/min (08/16/18 1000)  . prismasol B22GK 4/0 2,000 mL/hr at 08/15/18 2000  . sodium citrate 2 %/dextrose 2.5% solution 3000 mL 430 mL/hr at 08/15/18 0400      LOS: 18 days    Time spent: 25 minutes    Barb Merino, MD [facility] Pager [phone]  If 7PM-7AM, please contact night-coverage [email or web address] Password [email or web address] 08/16/2018, 10:18 AM

## 2018-08-16 NOTE — Progress Notes (Signed)
ANTICOAGULATION CONSULT NOTE  Pharmacy Consult for IV heparin Indication: positive COVID patient on CRRT needs systemic anticoagulation, a-fib coverage   No Known Allergies  Patient Measurements: Height: 5\' 10"  (177.8 cm) Weight: 276 lb 7.3 oz (125.4 kg) IBW/kg (Calculated) : 73 Heparin Dosing Weight: 104 kg  Vital Signs: Temp: 100.6 F (38.1 C) (04/19 0500) Temp Source: Axillary (04/19 0500) BP: 108/55 (04/19 0700) Pulse Rate: 97 (04/19 0700)  Labs: Recent Labs    08/14/18 1020  08/15/18 0453 08/15/18 1600 08/15/18 2329 08/16/18 0428  HGB 8.3*   < > 8.2*  --  8.5* 8.2*  HCT 27.5*   < > 27.5*  --  25.0* 26.5*  PLT 150  --  170  --   --  154  APTT 79*  --  64* 61*  --  58*  HEPARINUNFRC 0.24*  --  0.22*  --   --  0.12*  CREATININE  --    < > 2.79* 3.05*  --  3.13*   < > = values in this interval not displayed.    Estimated Creatinine Clearance: 36.7 mL/min (A) (by C-G formula based on SCr of 3.13 mg/dL (H)).   Assessment: 52 y/oM to start heparin infusion per pharmacy. Positive COVID patient on CRRT needs systemic anticoagulation per CCM request. Also with a-fib. Patient previously on systemic heparin, but was stopped after patient developed hematochezia. However, dialysis catheter has continuously clotted off, neccesitating the restart of IV heparin.   Today, 08/16/18  APTT 58 subtherapeutic and trending down on current IV heparin rate of 2800 units/hr  RN reports no bleeding or infusion related issues  Reports no problems with CRRT now.   Hgb 8.2 stable, Plts 154 down from 170  Goal of Therapy:  Heparin level 0.3-0.7 units/ml, aPTT 66-102sec Monitor platelets by anticoagulation protocol: Yes   Plan:   Increase heparin drip at 2900 units/hr  Recheck aPTT 6 hours after rate increase  Daily aPTT/HL/CBC while on heparin infusion.   Monitor closely for signs and symptoms of bleeding  Kara Mead 08/16/2018, 7:37 AM

## 2018-08-16 NOTE — Progress Notes (Signed)
Patient continuing to breathe over the ventilator with sedation at maximum allowed limit.  RN updated Dr. [place] Caller, received order for Dilaudid gtt.

## 2018-08-16 NOTE — Progress Notes (Signed)
ANTICOAGULATION CONSULT NOTE  Pharmacy Consult for IV heparin Indication: positive COVID patient on CRRT needs systemic anticoagulation, a-fib coverage   No Known Allergies  Patient Measurements: Height: 5\' 10"  (177.8 cm) Weight: 276 lb 7.3 oz (125.4 kg) IBW/kg (Calculated) : 73 Heparin Dosing Weight: 104 kg  Vital Signs: Temp: 99.5 F (37.5 C) (04/19 0800) Temp Source: Axillary (04/19 0800) BP: 130/78 (04/19 1200) Pulse Rate: 122 (04/19 1200)  Labs: Recent Labs    08/14/18 1020  08/15/18 0453 08/15/18 1600 08/15/18 2329 08/16/18 0428 08/16/18 1400  HGB 8.3*   < > 8.2*  --  8.5* 8.2*  --   HCT 27.5*   < > 27.5*  --  25.0* 26.5*  --   PLT 150  --  170  --   --  154  --   APTT 79*  --  64* 61*  --  58* 68*  HEPARINUNFRC 0.24*  --  0.22*  --   --  0.12*  --   CREATININE  --    < > 2.79* 3.05*  --  3.13*  --    < > = values in this interval not displayed.    Estimated Creatinine Clearance: 36.7 mL/min (A) (by C-G formula based on SCr of 3.13 mg/dL (H)).   Assessment: 52 y/oM to start heparin infusion per pharmacy. Positive COVID patient on CRRT needs systemic anticoagulation per CCM request. Also with a-fib. Patient previously on systemic heparin, but was stopped after patient developed hematochezia. However, dialysis catheter has continuously clotted off, neccesitating the restart of IV heparin.   Today, 08/16/18  APTT 68 therapeutic on current IV heparin rate of 2900 units/hr  RN reports no bleeding or infusion related issues  Reports no problems with CRRT now.   Hgb 8.2 stable, Plts 154 down from 170  Goal of Therapy:  Heparin level 0.3-0.7 units/ml, aPTT 66-102sec Monitor platelets by anticoagulation protocol: Yes   Plan:   Continue heparin drip at 2900 units/hr  Daily aPTT/HL/CBC while on heparin infusion.   Monitor closely for signs and symptoms of bleeding  Biagio Borg 08/16/2018, 3:48 PM

## 2018-08-16 NOTE — Progress Notes (Signed)
NAME:  [name] Long, MRN:  [id number], DOB:  October 26, 1965, LOS: 10 ADMISSION DATE:  08/24/2018, CONSULTATION DATE:  07/30/2018 REFERRING MD:  07/30/2018, CHIEF COMPLAINT:   Brief History   53 yo male presented to [facility] with several days of abdominal pain from diverticulitis with retained perforation.  Developed pneumonia with hypoxia from [place].  Transferred to [facility]. Actemra not given due to diverticulitis  Past Medical History  DM2, HTN, Former smoker, OSA, A fib  [facility] Events   4/01 admission for diverticulitis 4/02 PCCM consulted for worsening dyspnea, hypoxemia 4/02 Proned  4/03 Proned  4/05 Improved oxygenation however ABG with respiratory and metabolic acidosis. Poor UOP 4/06 Supine; completed plaquenil 4/07 CVVHD, difficulty with filters clotting  4/08 CVVHD ongoing, AFwRVR afternoon of 4/8, started on amio gtt   4/12 hemodialysis catheter changed to right femoral location; brief episode of PEA resolved sponateously 08/11/2018 hemodialysis catheter and filter for CRRT machine or clotting off. 08/11/2018 started on heparin drip for atrial fibrillation in filter clotting on CRRT machine 4/18- Was off sedation transiently 4/17, then had to be started on Precedex. He has tolerated pressure support 8 very well, used PRVC auto mode for the rest of the day yesterday 4/17 Had some agitation requiring increased fentanyl, sedation overnight.  Resultant hypotension and low-dose norepinephrine started.  Now weaned to off.  Progressive leukopenia, Zosyn stopped 4/17  Consults:  Renal GI signed off Surgery signed off Procedures:  ETT 4/2 >>  L IJ HD 4/6 >> 4/12 R Rad Aline 4/6 >>  4/12 right femoral HD catheter 24 cm>>  Significant Diagnostic Tests:  CT chest 4/01 [name]) >> b/l GGO in periphery CT abd/pelvis 4/01 [name]) >> perforated diverticulum lower descending colon, fatty liver with ?cirrhosis, splenomegaly 18.4 cm  Micro Data:  COVID 3/30 (PCP office) >>  DETECTED Blood 4/12 >> COVID at [facility] 08/16/2018 (repeat for clearance) -   Antimicrobials:  Zosyn 4/1 >> 4/7 Zoysn 4/12 >>4/17  Interim history/subjective:  4/19 - on fent gtt, precedex gtt, amio gtt, heparin gtt (for CRRT reasons) and levophed gtt. Levophed need is new. Post round and pre-note - had brief episode of severe bardycardia with pulse ox 0%. Wife spoke to him over phone and apparently this improved. No CPR given. On 40% fio2, peep 8. Gets very tachypneic Febrile to 101F .   RN also reports Abd distension but tolerating TF and has some diarrhea  CRRT  conitnues.   Objective   Blood pressure 130/78, pulse (!) 122, temperature 99.5 F (37.5 C), temperature source Axillary, resp. rate (!) 25, height 5\' 10"  (1.778 m), weight 125.4 kg, SpO2 96 %. CVP:  [1 mmHg-19 mmHg] 12 mmHg  Vent Mode: PRVC FiO2 (%):  [40 %-100 %] 100 % Set Rate:  [16 bmp] 16 bmp Vt Set:  [510 mL] 510 mL PEEP:  [8 cmH20] 8 cmH20   Intake/Output Summary (Last 24 hours) at 08/16/2018 1215 Last data filed at 08/16/2018 1200 Gross per 24 hour  Intake 4896.83 ml  Output 2478 ml  Net 2418.83 ml   Filed Weights   08/14/18 0554 08/15/18 0500 08/16/18 0500  Weight: 126.7 kg 126.2 kg 125.4 kg     Due to Pandemic situation - this exam is an inspection exam from outside or via camera and with tactile part being shared findings with other care providers who have examined in last 60 minutes   General Appearance:  Looks criticall ill and deconditioned HEENT: no obvious swelling Throat:  ETT TUBE -  yes , OG tube - yes Neck:  Supple,  No enlargement/tenderness/nodules Lungs: Ventilator   Synchrony -no Tacyhpneic + Heart:  S1 and S2 normal, no murmur, CVP - x  Pressors - levophed Abdomen:  Not examined Genitalia / Rectal:  Not examined Extremities:  Extremities- intct Skin:  Intact in exposed areas . Sacral area - not examined Neurologic:  Sedation - fent gtt, precedex gtt -> RASS - -3    LABS     PULMONARY Recent Labs  Lab 08/14/18 1615 08/14/18 1812 08/14/18 1815 08/14/18 1820 08/14/18 1954 08/15/18 0438 08/15/18 2329  PHART 7.442 7.400 7.309* 7.398  --  7.490*  --   PCO2ART 51.3* 56.8* 63.0* 56.0*  --  45.2  --   PO2ART 80.0* 149.0* 40.0* 164*  --  98.8  --   HCO3 35.0* 35.2* 31.6* 33.8* 31.3* 34.4* 25.7  TCO2 37* 37* 34*  --  33*  --  27  O2SAT 96.0 99.0 68.0 99.3 56.0 98.4 54.0    CBC Recent Labs  Lab 08/14/18 1020  08/15/18 0453 08/15/18 2329 08/16/18 0428  HGB 8.3*   < > 8.2* 8.5* 8.2*  HCT 27.5*   < > 27.5* 25.0* 26.5*  WBC 1.9*  --  1.4*  --  1.3*  PLT 150  --  170  --  154   < > = values in this interval not displayed.    COAGULATION No results for input(s): INR in the last 168 hours.  CARDIAC  No results for input(s): TROPONINI in the last 168 hours. No results for input(s): PROBNP in the last 168 hours.   CHEMISTRY Recent Labs  Lab 08/13/18 0420  08/13/18 2209  08/14/18 0351  08/14/18 1600  08/14/18 1954 08/15/18 0453 08/15/18 1600 08/15/18 2329 08/16/18 0428  NA 136   < >  --    < > 136   < > 136   < > 137 136 137 139 134*  K 3.9   < >  --    < > 3.9   < > 3.9   < > 3.8 3.7 3.8 3.5 3.6  CL 94*   < >  --   --  92*  --  92*  --   --  92* 94*  --  94*  CO2 29   < >  --   --  29  --  31  --   --  31 29  --  28  GLUCOSE 161*   < >  --   --  184*  --  187*  --   --  195* 209*  --  177*  BUN 51*   < >  --   --  42*  --  41*  --   --  42* 43*  --  46*  CREATININE 3.37*   < >  --   --  3.14*  --  2.97*  --   --  2.79* 3.05*  --  3.13*  CALCIUM 9.2   < >  --   --  9.6  --  9.5  --   --  9.2 7.8*  --  7.6*  MG 2.2  --  2.1  --  2.3  --   --   --   --  2.1  --   --  2.1  PHOS 3.1   < >  --   --  3.4  --  3.5  --   --  2.3*  2.1*  --  2.0*   < > = values in this interval not displayed.   Estimated Creatinine Clearance: 36.7 mL/min (A) (by C-G formula based on SCr of 3.13 mg/dL (H)).   LIVER Recent Labs  Lab 08/14/18 0351 08/14/18 1600  08/15/18 0453 08/15/18 1600 08/16/18 0428  ALBUMIN 2.5* 2.5* 2.3* 2.1* 2.4*     INFECTIOUS No results for input(s): LATICACIDVEN, PROCALCITON in the last 168 hours.   ENDOCRINE CBG (last 3)  Recent Labs    08/16/18 0413 08/16/18 0750 08/16/18 1214  GLUCAP 169* 213* 236*         IMAGING x48h  - image(s) personally visualized  -   highlighted in bold Dg Chest Port 1 View  Result Date: 08/16/2018 CLINICAL DATA:  Per order- acute respiratory failure PT HX: DM, ex smoker, HTN EXAM: PORTABLE CHEST 1 VIEW COMPARISON:  Radiograph 08/12/2018 FINDINGS: Endotracheal tube and NG tube unchanged. Stable moderately large cardiac silhouette. Mild diffuse pulmonary parenchymal opacities unchanged from comparison exam. IMPRESSION: 1. Stable support apparatus. 2. No interval change. 3. Diffuse mild pulmonary parenchymal opacities again noted. Electronically Signed   By: Suzy Bouchard M.D.   On: 08/16/2018 06:10      Resolved Hospital Problem list   Mixed Acidosis   Assessment & Plan:  Hx of OSA. Acute hypoxic respiratory failure with ARDS from COVID 19 pneumonia.   08/16/2018 - > does not meet criteria for SBT/Extubation in setting of Acute Respiratory Failure due to MODS and encephalopathy, pressor need, CRRT and profound deconditioning. LOS 18   Plan - PRVC - HOb > 30 degrees - Needs trach (see goals 08/15/2018) - recheck covid x 2 x 24h apart   NEUROLOGIC A:   Acute encephalopathy   08/16/2018 - needing fent gtt and precedex gtt but having bp/hr issues   P:   Continue fent gtt DC precedex gtt Start versed gtt    VASCULAR A:   Circulatory shock with significant BP/HR issues  P-A Fib - brady and tachy - on precedex , levophed and amio gt  P:  Dc precedex -> aim to wean off levophed DC hydralazine - causes reflex tachycardia DC lopressor - currenty needing levophed contine IV amio gtt Continue IV heparin (aslo being used for CRRT )  CARDIAC A: At risk MI   P: monit   INFECTIOUS A:   COVID 19  + presented as diverticulitis. Sympt approx 3/27 and dx 07/27/2018. Wife says he goes into a plastic plant, gas station and store  08/16/2018 - > new fever 101F  P:   Retest via trach aspirate 08/16/2018 and 08/17/18 and If negative ? Can dc isolation Blood culture Check sepsis biomarkers   RENAL A:  ATN  - anuric  08/16/2018 -> CRRT continues  P:  On ESRD  Per renal  ELECTROLYTES A:  Nil acute P: Per triad   GASTROINTESTINAL A:   Presented as diverticultis  P:   Conservative supporitve care by GI, CCS and Triad TF per triad  HEMATOLOGIC A:  Anemia critical illness   P:  - PRBC for hgb </= 6.9gm%    - exceptions are   -  if ACS susepcted/confirmed then transfuse for hgb </= 8.0gm%,  or    -  active bleeding with hemodynamic instability, then transfuse regardless of hemoglobin value   At at all times try to transfuse 1 unit prbc as possible with exception of active hemorrhage     ENDOCRINE A:   dm  P:   ssi  MSK/DERM Unclear if sacral decub    Best practice:  Diet: NPO, tube feedings trickle DVT prophylaxis: SCDs GI prophylaxis: pantoprazole Mobility: bed rest Code Status: No CPR or defibrillation but full medical care Family Communication:   - 4/18 - Wife updated. OK with trach if needed - 4/19 - wife [name] Long -> [phone] - updated (she and patient mother also had confirmed diagnosis 07/31/2018) -> advised her and patient mother to donate to www.[email or web address]  (if having difficulties - call back) to help for convalescent blood to [employer] [facility]    Disposition: ICU      ATTESTATION & SIGNATURE   The patient [name] Long is critically ill with multiple organ systems failure and requires high complexity decision making for assessment and support, frequent evaluation and titration of therapies, application of advanced monitoring technologies and extensive interpretation of multiple databases.    Critical Care Time devoted to patient care services described in this note is  30  Minutes. This time reflects time of care of this signee Dr Brand Males. This critical care time does not reflect procedure time, or teaching time or supervisory time of PA/NP/Med student/Med Resident etc but could involve care discussion time     Dr. Brand Males, M.D., [facility].C.P Pulmonary and Critical Care Medicine Staff Physician [facility] Pager: [phone], If no answer or between  15:00h - 7:00h: call [phone]  08/16/2018 12:16 PM

## 2018-08-16 NOTE — Progress Notes (Signed)
Pharmacy Antibiotic Note  [name] Long is a 53 y.o. male admitted on 08/21/2018 with sepsis.  Pharmacy has been consulted for vanc/zosyn dosing. Patient has been treated already with Zosyn for diverticulitis and has been positive for COVID-19 currently sedated on vent and CRRT as well on NE drip  Plan: 1) Per CRRT dosing recommendations, start Zosyn 2.25g IV q6 2) Also per CRRT rec's, used adjusted dosing weight of 94kg for vanc dosing, start vancomycin 2g IV x 1 then 1g IV q24  Height: 5\' 10"  (177.8 cm) Weight: 276 lb 7.3 oz (125.4 kg) IBW/kg (Calculated) : 73  Temp (24hrs), Avg:100.2 F (37.9 C), Min:99.5 F (37.5 C), Max:101 F (38.3 C)  Recent Labs  Lab 08/13/18 0420  08/14/18 0351 08/14/18 1020 08/14/18 1600 08/15/18 0453 08/15/18 1600 08/16/18 0428  WBC 2.9*  --  2.5* 1.9*  --  1.4*  --  1.3*  CREATININE 3.37*   < > 3.14*  --  2.97* 2.79* 3.05* 3.13*   < > = values in this interval not displayed.    Estimated Creatinine Clearance: 36.7 mL/min (A) (by C-G formula based on SCr of 3.13 mg/dL (H)).    No Known Allergies  Thank you for allowing pharmacy to be a part of this patient's care.  Kara Mead 08/16/2018 1:14 PM

## 2018-08-17 ENCOUNTER — Inpatient Hospital Stay (HOSPITAL_COMMUNITY): Payer: BLUE CROSS/BLUE SHIELD

## 2018-08-17 DIAGNOSIS — J189 Pneumonia, unspecified organism: Secondary | ICD-10-CM

## 2018-08-17 LAB — POCT I-STAT 7, (LYTES, BLD GAS, ICA,H+H)
Acid-Base Excess: 11 mmol/L — ABNORMAL HIGH (ref 0.0–2.0)
Acid-Base Excess: 3 mmol/L — ABNORMAL HIGH (ref 0.0–2.0)
Acid-Base Excess: 3 mmol/L — ABNORMAL HIGH (ref 0.0–2.0)
Acid-Base Excess: 4 mmol/L — ABNORMAL HIGH (ref 0.0–2.0)
Acid-Base Excess: 6 mmol/L — ABNORMAL HIGH (ref 0.0–2.0)
Acid-Base Excess: 9 mmol/L — ABNORMAL HIGH (ref 0.0–2.0)
Bicarbonate: 28.1 mmol/L — ABNORMAL HIGH (ref 20.0–28.0)
Bicarbonate: 30.1 mmol/L — ABNORMAL HIGH (ref 20.0–28.0)
Bicarbonate: 30.4 mmol/L — ABNORMAL HIGH (ref 20.0–28.0)
Bicarbonate: 30.7 mmol/L — ABNORMAL HIGH (ref 20.0–28.0)
Bicarbonate: 34.7 mmol/L — ABNORMAL HIGH (ref 20.0–28.0)
Bicarbonate: 35.6 mmol/L — ABNORMAL HIGH (ref 20.0–28.0)
Calcium, Ion: 0.94 mmol/L — ABNORMAL LOW (ref 1.15–1.40)
Calcium, Ion: 1 mmol/L — ABNORMAL LOW (ref 1.15–1.40)
Calcium, Ion: 1 mmol/L — ABNORMAL LOW (ref 1.15–1.40)
Calcium, Ion: 1.01 mmol/L — ABNORMAL LOW (ref 1.15–1.40)
Calcium, Ion: 1.16 mmol/L (ref 1.15–1.40)
Calcium, Ion: 1.19 mmol/L (ref 1.15–1.40)
HCT: 20 % — ABNORMAL LOW (ref 39.0–52.0)
HCT: 21 % — ABNORMAL LOW (ref 39.0–52.0)
HCT: 23 % — ABNORMAL LOW (ref 39.0–52.0)
HCT: 24 % — ABNORMAL LOW (ref 39.0–52.0)
HCT: 24 % — ABNORMAL LOW (ref 39.0–52.0)
HCT: 33 % — ABNORMAL LOW (ref 39.0–52.0)
Hemoglobin: 11.2 g/dL — ABNORMAL LOW (ref 13.0–17.0)
Hemoglobin: 6.8 g/dL — CL (ref 13.0–17.0)
Hemoglobin: 7.1 g/dL — ABNORMAL LOW (ref 13.0–17.0)
Hemoglobin: 7.8 g/dL — ABNORMAL LOW (ref 13.0–17.0)
Hemoglobin: 8.2 g/dL — ABNORMAL LOW (ref 13.0–17.0)
Hemoglobin: 8.2 g/dL — ABNORMAL LOW (ref 13.0–17.0)
O2 Saturation: 100 %
O2 Saturation: 88 %
O2 Saturation: 97 %
O2 Saturation: 98 %
O2 Saturation: 99 %
O2 Saturation: 99 %
Patient temperature: 98
Patient temperature: 98
Patient temperature: 98.1
Potassium: 3.8 mmol/L (ref 3.5–5.1)
Potassium: 3.8 mmol/L (ref 3.5–5.1)
Potassium: 4 mmol/L (ref 3.5–5.1)
Potassium: 4 mmol/L (ref 3.5–5.1)
Potassium: 4.2 mmol/L (ref 3.5–5.1)
Potassium: 4.6 mmol/L (ref 3.5–5.1)
Sodium: 135 mmol/L (ref 135–145)
Sodium: 135 mmol/L (ref 135–145)
Sodium: 135 mmol/L (ref 135–145)
Sodium: 136 mmol/L (ref 135–145)
Sodium: 136 mmol/L (ref 135–145)
Sodium: 137 mmol/L (ref 135–145)
TCO2: 29 mmol/L (ref 22–32)
TCO2: 32 mmol/L (ref 22–32)
TCO2: 32 mmol/L (ref 22–32)
TCO2: 32 mmol/L (ref 22–32)
TCO2: 36 mmol/L — ABNORMAL HIGH (ref 22–32)
TCO2: 37 mmol/L — ABNORMAL HIGH (ref 22–32)
pCO2 arterial: 42.2 mmHg (ref 32.0–48.0)
pCO2 arterial: 45.8 mmHg (ref 32.0–48.0)
pCO2 arterial: 46.4 mmHg (ref 32.0–48.0)
pCO2 arterial: 52.4 mmHg — ABNORMAL HIGH (ref 32.0–48.0)
pCO2 arterial: 58 mmHg — ABNORMAL HIGH (ref 32.0–48.0)
pCO2 arterial: 58.9 mmHg — ABNORMAL HIGH (ref 32.0–48.0)
pH, Arterial: 7.315 — ABNORMAL LOW (ref 7.350–7.450)
pH, Arterial: 7.328 — ABNORMAL LOW (ref 7.350–7.450)
pH, Arterial: 7.395 (ref 7.350–7.450)
pH, Arterial: 7.428 (ref 7.350–7.450)
pH, Arterial: 7.47 — ABNORMAL HIGH (ref 7.350–7.450)
pH, Arterial: 7.492 — ABNORMAL HIGH (ref 7.350–7.450)
pO2, Arterial: 123 mmHg — ABNORMAL HIGH (ref 83.0–108.0)
pO2, Arterial: 124 mmHg — ABNORMAL HIGH (ref 83.0–108.0)
pO2, Arterial: 195 mmHg — ABNORMAL HIGH (ref 83.0–108.0)
pO2, Arterial: 61 mmHg — ABNORMAL LOW (ref 83.0–108.0)
pO2, Arterial: 93 mmHg (ref 83.0–108.0)
pO2, Arterial: 95 mmHg (ref 83.0–108.0)

## 2018-08-17 LAB — RENAL FUNCTION PANEL
Albumin: 2 g/dL — ABNORMAL LOW (ref 3.5–5.0)
Albumin: 2 g/dL — ABNORMAL LOW (ref 3.5–5.0)
Anion gap: 11 (ref 5–15)
Anion gap: 14 (ref 5–15)
BUN: 37 mg/dL — ABNORMAL HIGH (ref 6–20)
BUN: 42 mg/dL — ABNORMAL HIGH (ref 6–20)
CO2: 25 mmol/L (ref 22–32)
CO2: 28 mmol/L (ref 22–32)
Calcium: 6.9 mg/dL — ABNORMAL LOW (ref 8.9–10.3)
Calcium: 7.7 mg/dL — ABNORMAL LOW (ref 8.9–10.3)
Chloride: 100 mmol/L (ref 98–111)
Chloride: 95 mmol/L — ABNORMAL LOW (ref 98–111)
Creatinine, Ser: 2.68 mg/dL — ABNORMAL HIGH (ref 0.61–1.24)
Creatinine, Ser: 2.93 mg/dL — ABNORMAL HIGH (ref 0.61–1.24)
GFR calc Af Amer: 30 mL/min — ABNORMAL LOW (ref >60)
GFR calc Af Amer: 27 mL/min — ABNORMAL LOW (ref >60)
GFR calc non Af Amer: 26 mL/min — ABNORMAL LOW (ref >60)
GFR calc non Af Amer: 23 mL/min — ABNORMAL LOW (ref >60)
Glucose, Bld: 245 mg/dL — ABNORMAL HIGH (ref 70–99)
Glucose, Bld: 269 mg/dL — ABNORMAL HIGH (ref 70–99)
Phosphorus: 3.7 mg/dL (ref 2.5–4.6)
Phosphorus: 3.2 mg/dL (ref 2.5–4.6)
Potassium: 4 mmol/L (ref 3.5–5.1)
Potassium: 4.5 mmol/L (ref 3.5–5.1)
Sodium: 136 mmol/L (ref 135–145)
Sodium: 137 mmol/L (ref 135–145)

## 2018-08-17 LAB — POCT I-STAT EG7
Acid-Base Excess: 4 mmol/L — ABNORMAL HIGH (ref 0.0–2.0)
Acid-Base Excess: 6 mmol/L — ABNORMAL HIGH (ref 0.0–2.0)
Acid-base deficit: 2 mmol/L (ref 0.0–2.0)
Acid-base deficit: 4 mmol/L — ABNORMAL HIGH (ref 0.0–2.0)
Acid-base deficit: 6 mmol/L — ABNORMAL HIGH (ref 0.0–2.0)
Acid-base deficit: 7 mmol/L — ABNORMAL HIGH (ref 0.0–2.0)
Bicarbonate: 22 mmol/L (ref 20.0–28.0)
Bicarbonate: 22.7 mmol/L (ref 20.0–28.0)
Bicarbonate: 24.5 mmol/L (ref 20.0–28.0)
Bicarbonate: 24.9 mmol/L (ref 20.0–28.0)
Bicarbonate: 31.4 mmol/L — ABNORMAL HIGH (ref 20.0–28.0)
Bicarbonate: 32.7 mmol/L — ABNORMAL HIGH (ref 20.0–28.0)
Calcium, Ion: 0.37 mmol/L — CL (ref 1.15–1.40)
Calcium, Ion: 0.41 mmol/L — CL (ref 1.15–1.40)
Calcium, Ion: <0.3 mmol/L — CL (ref 1.15–1.40)
Calcium, Ion: <0.3 mmol/L — CL (ref 1.15–1.40)
Calcium, Ion: <0.3 mmol/L — CL (ref 1.15–1.40)
Calcium, Ion: <0.3 mmol/L — CL (ref 1.15–1.40)
HCT: 25 % — ABNORMAL LOW (ref 39.0–52.0)
HCT: 27 % — ABNORMAL LOW (ref 39.0–52.0)
HCT: 28 % — ABNORMAL LOW (ref 39.0–52.0)
HCT: 29 % — ABNORMAL LOW (ref 39.0–52.0)
HCT: 30 % — ABNORMAL LOW (ref 39.0–52.0)
HCT: 30 % — ABNORMAL LOW (ref 39.0–52.0)
Hemoglobin: 10.2 g/dL — ABNORMAL LOW (ref 13.0–17.0)
Hemoglobin: 10.2 g/dL — ABNORMAL LOW (ref 13.0–17.0)
Hemoglobin: 8.5 g/dL — ABNORMAL LOW (ref 13.0–17.0)
Hemoglobin: 9.2 g/dL — ABNORMAL LOW (ref 13.0–17.0)
Hemoglobin: 9.5 g/dL — ABNORMAL LOW (ref 13.0–17.0)
Hemoglobin: 9.9 g/dL — ABNORMAL LOW (ref 13.0–17.0)
O2 Saturation: 43 %
O2 Saturation: 46 %
O2 Saturation: 48 %
O2 Saturation: 57 %
O2 Saturation: 58 %
O2 Saturation: 58 %
Patient temperature: 98
Patient temperature: 98.4
Potassium: 3.7 mmol/L (ref 3.5–5.1)
Potassium: 3.7 mmol/L (ref 3.5–5.1)
Potassium: 3.8 mmol/L (ref 3.5–5.1)
Potassium: 3.8 mmol/L (ref 3.5–5.1)
Potassium: 3.9 mmol/L (ref 3.5–5.1)
Potassium: 3.9 mmol/L (ref 3.5–5.1)
Sodium: 138 mmol/L (ref 135–145)
Sodium: 138 mmol/L (ref 135–145)
Sodium: 138 mmol/L (ref 135–145)
Sodium: 139 mmol/L (ref 135–145)
Sodium: 141 mmol/L (ref 135–145)
Sodium: 142 mmol/L (ref 135–145)
TCO2: 24 mmol/L (ref 22–32)
TCO2: 25 mmol/L (ref 22–32)
TCO2: 26 mmol/L (ref 22–32)
TCO2: 26 mmol/L (ref 22–32)
TCO2: 33 mmol/L — ABNORMAL HIGH (ref 22–32)
TCO2: 35 mmol/L — ABNORMAL HIGH (ref 22–32)
pCO2, Ven: 50.3 mmHg (ref 44.0–60.0)
pCO2, Ven: 57.9 mmHg (ref 44.0–60.0)
pCO2, Ven: 60 mmHg (ref 44.0–60.0)
pCO2, Ven: 60.6 mmHg — ABNORMAL HIGH (ref 44.0–60.0)
pCO2, Ven: 61.2 mmHg — ABNORMAL HIGH (ref 44.0–60.0)
pCO2, Ven: 64.5 mmHg — ABNORMAL HIGH (ref 44.0–60.0)
pH, Ven: 7.172 — CL (ref 7.250–7.430)
pH, Ven: 7.177 — CL (ref 7.250–7.430)
pH, Ven: 7.188 — CL (ref 7.250–7.430)
pH, Ven: 7.303 (ref 7.250–7.430)
pH, Ven: 7.34 (ref 7.250–7.430)
pH, Ven: 7.341 (ref 7.250–7.430)
pO2, Ven: 30 mmHg — CL (ref 32.0–45.0)
pO2, Ven: 32 mmHg (ref 32.0–45.0)
pO2, Ven: 32 mmHg (ref 32.0–45.0)
pO2, Ven: 33 mmHg (ref 32.0–45.0)
pO2, Ven: 33 mmHg (ref 32.0–45.0)
pO2, Ven: 34 mmHg (ref 32.0–45.0)

## 2018-08-17 LAB — CBC
HCT: 23.7 % — ABNORMAL LOW (ref 39.0–52.0)
HCT: 22.5 % — ABNORMAL LOW (ref 39.0–52.0)
Hemoglobin: 7 g/dL — ABNORMAL LOW (ref 13.0–17.0)
Hemoglobin: 6.8 g/dL — CL (ref 13.0–17.0)
MCH: 28.6 pg (ref 26.0–34.0)
MCH: 28.9 pg (ref 26.0–34.0)
MCHC: 29.5 g/dL — ABNORMAL LOW (ref 30.0–36.0)
MCHC: 30.2 g/dL (ref 30.0–36.0)
MCV: 96.7 fL (ref 80.0–100.0)
MCV: 95.7 fL (ref 80.0–100.0)
Platelets: 141 10*3/uL — ABNORMAL LOW (ref 150–400)
Platelets: 159 10*3/uL (ref 150–400)
RBC: 2.45 MIL/uL — ABNORMAL LOW (ref 4.22–5.81)
RBC: 2.35 MIL/uL — ABNORMAL LOW (ref 4.22–5.81)
RDW: 19.4 % — ABNORMAL HIGH (ref 11.5–15.5)
RDW: 19.6 % — ABNORMAL HIGH (ref 11.5–15.5)
WBC: 0.8 10*3/uL — CL (ref 4.0–10.5)
WBC: 0.8 10*3/uL — CL (ref 4.0–10.5)
nRBC: 0 % (ref 0.0–0.2)
nRBC: 2.6 % — ABNORMAL HIGH (ref 0.0–0.2)

## 2018-08-17 LAB — HEPATIC FUNCTION PANEL
ALT: 25 U/L (ref 0–44)
AST: 15 U/L (ref 15–41)
Albumin: 2 g/dL — ABNORMAL LOW (ref 3.5–5.0)
Alkaline Phosphatase: 59 U/L (ref 38–126)
Bilirubin, Direct: 0.6 mg/dL — ABNORMAL HIGH (ref 0.0–0.2)
Indirect Bilirubin: 0.7 mg/dL (ref 0.3–0.9)
Total Bilirubin: 1.3 mg/dL — ABNORMAL HIGH (ref 0.3–1.2)
Total Protein: 5.5 g/dL — ABNORMAL LOW (ref 6.5–8.1)

## 2018-08-17 LAB — BLOOD GAS, ARTERIAL
Acid-Base Excess: 1.4 mmol/L (ref 0.0–2.0)
Acid-Base Excess: 2.4 mmol/L — ABNORMAL HIGH (ref 0.0–2.0)
Bicarbonate: 29.5 mmol/L — ABNORMAL HIGH (ref 20.0–28.0)
Bicarbonate: 29.6 mmol/L — ABNORMAL HIGH (ref 20.0–28.0)
Drawn by: 257701
FIO2: 90
FIO2: 70
MECHVT: 510 mL
MECHVT: 510 mL
O2 Saturation: 96.7 %
O2 Saturation: 92.9 %
PEEP: 12 cmH2O
PEEP: 12 cmH2O
Patient temperature: 99.4
Patient temperature: 98.6
RATE: 16 resp/min
RATE: 20 resp/min
pCO2 arterial: 74.9 mmHg (ref 32.0–48.0)
pCO2 arterial: 65.8 mmHg (ref 32.0–48.0)
pH, Arterial: 7.223 — ABNORMAL LOW (ref 7.350–7.450)
pH, Arterial: 7.275 — ABNORMAL LOW (ref 7.350–7.450)
pO2, Arterial: 98 mmHg (ref 83.0–108.0)
pO2, Arterial: 68.6 mmHg — ABNORMAL LOW (ref 83.0–108.0)

## 2018-08-17 LAB — GLUCOSE, CAPILLARY
Glucose-Capillary: 190 mg/dL — ABNORMAL HIGH (ref 70–99)
Glucose-Capillary: 204 mg/dL — ABNORMAL HIGH (ref 70–99)
Glucose-Capillary: 209 mg/dL — ABNORMAL HIGH (ref 70–99)
Glucose-Capillary: 210 mg/dL — ABNORMAL HIGH (ref 70–99)
Glucose-Capillary: 216 mg/dL — ABNORMAL HIGH (ref 70–99)
Glucose-Capillary: 255 mg/dL — ABNORMAL HIGH (ref 70–99)

## 2018-08-17 LAB — APTT: aPTT: 54 seconds — ABNORMAL HIGH (ref 24–36)

## 2018-08-17 LAB — LACTIC ACID, PLASMA
Lactic Acid, Venous: 1.4 mmol/L (ref 0.5–1.9)
Lactic Acid, Venous: 2.1 mmol/L (ref 0.5–1.9)

## 2018-08-17 LAB — CALCIUM, IONIZED: Calcium, Ionized, Serum: 4 mg/dL — ABNORMAL LOW (ref 4.5–5.6)

## 2018-08-17 LAB — PREPARE RBC (CROSSMATCH)

## 2018-08-17 LAB — PROCALCITONIN: Procalcitonin: 0.88 ng/mL

## 2018-08-17 LAB — HEPARIN LEVEL (UNFRACTIONATED): Heparin Unfractionated: <0.1 IU/mL — ABNORMAL LOW (ref 0.30–0.70)

## 2018-08-17 LAB — MAGNESIUM: Magnesium: 1.8 mg/dL (ref 1.7–2.4)

## 2018-08-17 MED ORDER — VASOPRESSIN 20 UNIT/ML IV SOLN
0.0300 [IU]/min | INTRAVENOUS | Status: DC
  Administered 2018-08-17: 0.03 [IU]/min via INTRAVENOUS
  Filled 2018-08-17 (×3): qty 2

## 2018-08-17 MED ORDER — VECURONIUM BOLUS VIA INFUSION
10.0000 mg | Freq: Once | INTRAVENOUS | Status: DC
  Filled 2018-08-17: qty 10

## 2018-08-17 MED ORDER — SODIUM CHLORIDE 0.9% IV SOLUTION: Freq: Once | INTRAVENOUS | Status: DC

## 2018-08-17 MED ORDER — HEPARIN (PORCINE) 25000 UT/250ML-% IV SOLN: 3000.0000 [IU]/h | INTRAVENOUS | Status: DC

## 2018-08-17 MED ORDER — PHENYLEPHRINE HCL-NACL 40-0.9 MG/250ML-% IV SOLN
0.0000 ug/min | INTRAVENOUS | Status: DC
  Administered 2018-08-17: 260 ug/min via INTRAVENOUS
  Administered 2018-08-17: 140 ug/min via INTRAVENOUS
  Administered 2018-08-17: 11:00:00 260 ug/min via INTRAVENOUS
  Administered 2018-08-18: 330 ug/min via INTRAVENOUS
  Administered 2018-08-18: 17:00:00 300 ug/min via INTRAVENOUS
  Administered 2018-08-18 (×2): 310 ug/min via INTRAVENOUS
  Administered 2018-08-18 (×2): 350 ug/min via INTRAVENOUS
  Administered 2018-08-22: 16:00:00 20 ug/min via INTRAVENOUS
  Filled 2018-08-17 (×23): qty 250

## 2018-08-17 MED ORDER — HEPARIN SODIUM (PORCINE) 1000 UNIT/ML IJ SOLN
INTRAMUSCULAR | Status: AC
  Administered 2018-08-17: 3000 [IU]
  Filled 2018-08-17: qty 3

## 2018-08-17 MED ORDER — AMIODARONE IV BOLUS ONLY 150 MG/100ML
150.0000 mg | Freq: Once | INTRAVENOUS | Status: AC
  Administered 2018-08-17: 150 mg via INTRAVENOUS

## 2018-08-17 MED ORDER — VECURONIUM BROMIDE 10 MG IV SOLR
10.0000 mg | Freq: Once | INTRAVENOUS | Status: AC
  Administered 2018-08-17: 10 mg via INTRAVENOUS
  Filled 2018-08-17: qty 10

## 2018-08-17 MED ORDER — ALTEPLASE 2 MG IJ SOLR
2.0000 mg | Freq: Once | INTRAMUSCULAR | Status: AC
  Administered 2018-08-17: 23:00:00 2 mg
  Filled 2018-08-17: qty 2

## 2018-08-17 MED ORDER — PHENYLEPHRINE HCL-NACL 10-0.9 MG/250ML-% IV SOLN
0.0000 ug/min | INTRAVENOUS | Status: DC
  Administered 2018-08-17: 20 ug/min via INTRAVENOUS
  Filled 2018-08-17: qty 500

## 2018-08-17 MED ORDER — HEPARIN SODIUM (PORCINE) 5000 UNIT/ML IJ SOLN
5000.0000 [IU] | Freq: Two times a day (BID) | INTRAMUSCULAR | Status: DC
  Administered 2018-08-17 – 2018-08-22 (×10): 5000 [IU] via SUBCUTANEOUS
  Filled 2018-08-17 (×10): qty 1

## 2018-08-17 MED ORDER — SODIUM CHLORIDE 0.9 % IV SOLN
0.0000 ug/min | INTRAVENOUS | Status: DC
  Filled 2018-08-17: qty 4

## 2018-08-17 NOTE — Progress Notes (Signed)
CRITICAL VALUE ALERT  Critical Value: lactic acid 2.1  Date & Time Notied:  08/17/2018 12:06 PM  Provider Notified: Dr. Elsworth Soho  Orders Received/Actions taken: Continue current treatment plan

## 2018-08-17 NOTE — Progress Notes (Signed)
[place] for IV heparin Indication: positive COVID patient on CRRT needs systemic anticoagulation, a-fib coverage   No Known Allergies  Patient Measurements: Height: 5\' 10"  (177.8 cm) Weight: 282 lb 13.6 oz (128.3 kg) IBW/kg (Calculated) : 73 Heparin Dosing Weight: 104 kg  Vital Signs: Temp: 98.3 F (36.8 C) (04/19 2000) Temp Source: Axillary (04/19 2000) BP: 126/71 (04/20 0400) Pulse Rate: 110 (04/20 0645)  Labs: Recent Labs    08/15/18 0453 08/15/18 1600 08/15/18 2329 08/16/18 0428 08/16/18 1400 08/17/18 0430  HGB 8.2*  --  8.5* 8.2*  --  7.0*  HCT 27.5*  --  25.0* 26.5*  --  23.7*  PLT 170  --   --  154  --  141*  APTT 64* 61*  --  58* 68* 54*  HEPARINUNFRC 0.22*  --   --  0.12*  --  <0.10*  CREATININE 2.79* 3.05*  --  3.13*  --  2.68*    Estimated Creatinine Clearance: 43.4 mL/min (A) (by C-G formula based on SCr of 2.68 mg/dL (H)).   Assessment: 52 y/oM to start heparin infusion per pharmacy. Positive COVID patient on CRRT needs systemic anticoagulation per CCM request. Also with a-fib. Patient previously on systemic heparin, but was stopped after patient developed hematochezia. However, dialysis catheter has continuously clotted off, neccesitating the restart of IV heparin.   Today, 08/17/18  APTT 54 SUBtherapeutic on current IV heparin rate of 2900 units/hr  RN given report by night RN that some blood tinged sputum noted when suctioned  Reports no problems with CRRT now.   Hgb 7.0 decreased, Plts 141 decreaseing  SCr 2.68 decreased on CRRT  Goal of Therapy:  Heparin level 0.3-0.7 units/ml, aPTT 66-102sec Monitor platelets by anticoagulation protocol: Yes   Plan:   Increase heparin drip to 3000 units/hr  Recheck aPTT/HL in 8hr  Daily aPTT/HL/CBC while on heparin infusion.   Monitor closely for signs and symptoms of bleeding  Peggyann Juba, PharmD, BCPS Pager: [phone] 08/17/2018, 7:00 AM

## 2018-08-17 NOTE — Progress Notes (Signed)
eLink Physician-Brief Progress Note Patient Name: [name] Long DOB: 08-23-1965 MRN: [id number]   Date of Service  08/17/2018  HPI/Events of Note  Notified of frequent clotting of lines and need for resumption of heparin drip. Off heparin drip due to possible bleed as with drop in H/H although no overt sign of bleed.  Patient does have a perforated diverticulitis that is not amenable to surgery given patiient's condition.  eICU Interventions  Cathflo ordered in order to facilitate CRRT. Will start patient on prophylactic dose heparin for now q 12 and not heparin drip due to risk for bleed. Continued on citrate via dialysis. Monitor H/H post transfusion and monitor for acute bleed. Discussed with bedside nurse.     Intervention Category Intermediate Interventions: Coagulopathy - evaluation and management;Bleeding - evaluation and treatment with blood products  Judd Lien 08/17/2018, 10:48 PM

## 2018-08-17 NOTE — Progress Notes (Signed)
Filter clotted. Changed filter at 2100. Access pressure negative alarm ringing out. Checked both ports. Red port flushing and drawing back blood. Blue port flushes well, but is not drawing back blood. Blood returned to patient. CRRT stopped for now. Elink and IV teamed notifed. Elink placed orders for subq heparin and TPA. IV team nurse going to TPA lines now. Will continue to closely monitor.

## 2018-08-17 NOTE — Progress Notes (Signed)
This patient is critically sick and continues to require ICU level of care. Critical care team continue to follow and address all care needs. Will follow peripherally to preserve PPE in the contest of ongoing COVID 19 pandemic.

## 2018-08-17 NOTE — Progress Notes (Signed)
CRITICAL VALUE ALERT  Critical Value:  WBC 0.76 Hgb 6.8  Date & Time Notied:  08/17/2018 5:27 PM  Provider Notified: Dr. Elsworth Soho  Orders Received/Actions taken: New orders received. See MAR.

## 2018-08-17 NOTE — Progress Notes (Signed)
Patient ID: [name] Long, male   DOB: 22-Oct-1965, 53 y.o.   MRN: [id number] S: remains critically ill and is on 100% FIO2 .  Filter has clotted earlier but changed and running well at present. O:BP 126/71   Pulse (!) 102   Temp 98.1 F (36.7 C) (Oral)   Resp 11   Ht '5\' 10"'  (1.778 m)   Wt 128.3 kg   SpO2 91%   BMI 40.58 kg/m   Intake/Output Summary (Last 24 hours) at 08/17/2018 1507 Last data filed at 08/17/2018 1500 Gross per 24 hour  Intake 6549.81 ml  Output 4661 ml  Net 1888.81 ml   Intake/Output: I/O last 3 completed shifts: In: 8858.5 [I.V.:6110.9; NG/GT:2089.1; IV Piggyback:658.4] Out: 5028 [Other:4728; Stool:300]  Intake/Output this shift:  Total I/O In: 2267.4 [I.V.:1679.9; Other:80; NG/GT:455; IV Piggyback:52.5] Out: 1620 [Other:1620] Weight change: 2.9 kg SAY:[id number] ill-appearing white male intubated and sedated Direct physical contact was not performed due to covid-19+ and exam discussed with PCCM  Recent Labs  Lab 08/13/18 1529  08/14/18 0351  08/14/18 1600  08/15/18 0453  08/15/18 1600  08/16/18 0428 08/16/18 0828 08/16/18 1638 08/16/18 1643 08/17/18 0030 08/17/18 0040 08/17/18 0430 08/17/18 1216  NA 137   < > 136   < > 136   < > 138  136   < > 137   < > 134* 142 141 137 139 136 136 136  K 3.8   < > 3.9   < > 3.9   < > 3.8  3.7   < > 3.8   < > 3.6 3.9 3.9 4.0 3.8 4.2 4.0 4.6  CL 93*  --  92*  --  92*  --  92*  --  94*  --  94*  --   --   --   --   --  100  --   CO2 31  --  29  --  31  --  31  --  29  --  28  --   --   --   --   --  25  --   GLUCOSE 156*  --  184*  --  187*  --  195*  --  209*  --  177*  --   --   --   --   --  245*  --   BUN 46*  --  42*  --  41*  --  42*  --  43*  --  46*  --   --   --   --   --  37*  --   CREATININE 3.17*  --  3.14*  --  2.97*  --  2.79*  --  3.05*  --  3.13*  --   --   --   --   --  2.68*  --   ALBUMIN 2.2*  --  2.5*  --  2.5*  --  2.3*  --  2.1*  --  2.4*  --   --   --   --   --  2.0*  2.0*  --   CALCIUM 9.4   --  9.6  --  9.5  --  9.2  --  7.8*  --  7.6*  --   --   --   --   --  6.9*  --   PHOS 2.9  --  3.4  --  3.5  --  2.3*  --  2.1*  --  2.0*  --   --   --   --   --  3.7  --   AST  --   --   --   --   --   --   --   --   --   --   --   --   --   --   --   --  15  --   ALT  --   --   --   --   --   --   --   --   --   --   --   --   --   --   --   --  25  --    < > = values in this interval not displayed.   Liver Function Tests: Recent Labs  Lab 08/15/18 1600 08/16/18 0428 08/17/18 0430  AST  --   --  15  ALT  --   --  25  ALKPHOS  --   --  59  BILITOT  --   --  1.3*  PROT  --   --  5.5*  ALBUMIN 2.1* 2.4* 2.0*  2.0*   No results for input(s): LIPASE, AMYLASE in the last 168 hours. No results for input(s): AMMONIA in the last 168 hours. CBC: Recent Labs  Lab 08/13/18 0420  08/14/18 0351  08/14/18 1020  08/15/18 0453  08/16/18 0428  08/17/18 0040 08/17/18 0430 08/17/18 1216  WBC 2.9*  --  2.5*  --  1.9*  --  1.4*  --  1.3*  --   --  0.8*  --   NEUTROABS 1.8  --   --   --   --   --   --   --   --   --   --   --   --   HGB 8.5*   < > 8.8*   < > 8.3*   < > 10.2*  8.2*   < > 8.2*   < > 8.2* 7.0* 7.1*  HCT 26.4*   < > 28.3*   < > 27.5*   < > 30.0*  27.5*   < > 26.5*   < > 24.0* 23.7* 21.0*  MCV 92.0  --  92.8  --  93.2  --  94.2  --  93.3  --   --  96.7  --   PLT 137*  --  151  --  150  --  170  --  154  --   --  141*  --    < > = values in this interval not displayed.   Cardiac Enzymes: No results for input(s): CKTOTAL, CKMB, CKMBINDEX, TROPONINI in the last 168 hours. CBG: Recent Labs  Lab 08/16/18 2019 08/16/18 2303 08/17/18 0428 08/17/18 0804 08/17/18 1212  GLUCAP 135* 156* 216* 190* 210*    Iron Studies: No results for input(s): IRON, TIBC, TRANSFERRIN, FERRITIN in the last 72 hours. Studies/Results: Dg Chest Port 1 View  Result Date: 08/17/2018 CLINICAL DATA:  53 year old male positive COVID-19. EXAM: PORTABLE CHEST 1 VIEW COMPARISON:  08/16/2018 and earlier.  FINDINGS: Portable AP semi upright view at 0349 hours. Endotracheal tube tip at the level the clavicles. Enteric tube courses to the abdomen, tip not included. Worsening bilateral indistinct pulmonary opacity since 08/10/2018 with peripheral and lower lung predominance. Mediastinal contours appear stable. No associated pneumothorax. No pleural effusion is evident. Paucity of bowel gas in the upper abdomen. IMPRESSION: 1.  Stable lines and tubes. 2. Progressed bilateral indistinct pulmonary opacity suggesting progressed COVID-19  respiratory disease. Electronically Signed   By: Genevie Ann M.D.   On: 08/17/2018 06:26   Dg Chest Port 1 View  Result Date: 08/16/2018 CLINICAL DATA:  Per order- acute respiratory failure PT HX: DM, ex smoker, HTN EXAM: PORTABLE CHEST 1 VIEW COMPARISON:  Radiograph 08/12/2018 FINDINGS: Endotracheal tube and NG tube unchanged. Stable moderately large cardiac silhouette. Mild diffuse pulmonary parenchymal opacities unchanged from comparison exam. IMPRESSION: 1. Stable support apparatus. 2. No interval change. 3. Diffuse mild pulmonary parenchymal opacities again noted. Electronically Signed   By: Suzy Bouchard M.D.   On: 08/16/2018 06:10   Dg Abd Portable 1v  Result Date: 08/17/2018 CLINICAL DATA:  53 year old male with abdominal distension. EXAM: PORTABLE ABDOMEN - 1 VIEW COMPARISON:  Prior abdominal radiograph 08/10/2018 FINDINGS: Well-positioned gastric tube. The tip of the tube overlies the gastric antrum in unchanged position. No significant gaseous distension or evidence of obstruction. No acute osseous abnormality. Partially imaged right femoral non tunneled hemodialysis catheter. IMPRESSION: 1. Stable position of gastric tube with the tip overlying the gastric antrum. 2. No evidence of bowel obstruction. Electronically Signed   By: Jacqulynn Cadet M.D.   On: 08/17/2018 13:42   . chlorhexidine gluconate (MEDLINE KIT)  15 mL Mouth Rinse BID  . Chlorhexidine Gluconate Cloth   6 each Topical Daily  . feeding supplement (PRO-STAT SUGAR FREE 64)  60 mL Per Tube TID  . insulin aspart  0-9 Units Subcutaneous Q4H  . mouth rinse  15 mL Mouth Rinse 10 times per day  . pantoprazole (PROTONIX) IV  40 mg Intravenous Q24H  . sodium chloride flush  10-40 mL Intracatheter Q12H    BMET    Component Value Date/Time   NA 136 08/17/2018 1216   K 4.6 08/17/2018 1216   CL 100 08/17/2018 0430   CO2 25 08/17/2018 0430   GLUCOSE 245 (H) 08/17/2018 0430   BUN 37 (H) 08/17/2018 0430   CREATININE 2.68 (H) 08/17/2018 0430   CALCIUM 6.9 (L) 08/17/2018 0430   GFRNONAA 26 (L) 08/17/2018 0430   GFRAA 30 (L) 08/17/2018 0430   CBC    Component Value Date/Time   WBC 0.8 (LL) 08/17/2018 0430   RBC 2.45 (L) 08/17/2018 0430   HGB 7.1 (L) 08/17/2018 1216   HCT 21.0 (L) 08/17/2018 1216   PLT 141 (L) 08/17/2018 0430   MCV 96.7 08/17/2018 0430   MCH 28.6 08/17/2018 0430   MCHC 29.5 (L) 08/17/2018 0430   RDW 19.4 (H) 08/17/2018 0430   LYMPHSABS 0.6 (L) 08/13/2018 0420   MONOABS 0.4 08/13/2018 0420   EOSABS 0.1 08/13/2018 0420   BASOSABS 0.0 08/13/2018 0420     Assessment/Plan:  1. ARF in setting of coronavirus sepsis.  Presumably due to ischemic ATN although unclear if there is direct viral injury with covid-19.  He has been receiving CVVHD since 08/02/18 in setting of worsening respiratory failure and metabolic acidosis.   1. HD catheter was changed to right fem site on 08/09/18 and will need to be exchanged this week. 2. Was kept + due to hypotension and bradycardia for the past 48 hours.  Will switch to keep even in light of worsening oxygenation/respiratory status. 2. Coronavirus PNA with sepsis and VDRF- intubated since 08/02/18.   3. ARDS- concern for possible HCAP on top of covid-19.  Now on zosyn and vancomycin per PCCM.  Will likely require prone ventilation again. 4. Septic shock- vasopressin added due to Afib with RVR related to levophed.  Per PCCM 5.  ABLA- off of heparin.    6. Hypophosphatemia- replete as needed 7. Disposition- poor overall prognosis. 8. Perforated diverticulum  Donetta Potts, MD [facility] [address] [phone]

## 2018-08-17 NOTE — Progress Notes (Addendum)
NAME:  Sincere Liuzzi, MRN:  [id number], DOB:  [birth year], LOS: 35 ADMISSION DATE:  08/21/2018, CONSULTATION DATE:  07/30/2018 REFERRING MD:  07/30/2018, CHIEF COMPLAINT:   Brief History   53 yo male presented to [facility] with several days of abdominal pain from diverticulitis with retained perforation.  Developed pneumonia with hypoxia from [place].  Transferred to [facility]. Actemra not given due to diverticulitis  Past Medical History  DM2, HTN, Former smoker, OSA, A fib  [place] [facility] Events   4/01 admission for diverticulitis 4/02 PCCM consulted for worsening dyspnea, hypoxemia 4/02 Proned  4/03 Proned  4/05 Improved oxygenation however ABG with respiratory and metabolic acidosis. Poor UOP 4/06 Supine; completed plaquenil 4/07 CVVHD, difficulty with filters clotting  4/08 CVVHD ongoing, AFwRVR afternoon of 4/8, started on amio gtt   4/12 hemodialysis catheter changed to right femoral location; brief episode of PEA resolved sponateously 08/11/2018 hemodialysis catheter and filter for CRRT machine or clotting off. 08/11/2018 started on heparin drip for atrial fibrillation in filter clotting on CRRT machine 4/18- Was off sedation transiently 4/17, then had to be started on Precedex. He has tolerated pressure support 8 very well, used PRVC auto mode for the rest of the day yesterday 4/17 Had some agitation requiring increased fentanyl, sedation overnight.  Resultant hypotension and low-dose norepinephrine started.  Now weaned to off.  4/19: had episode of bradycardia, hypotension, hypoxia. Etiology unclear. Started back on pressors and deep sedation.   Consults:  Renal GI signed off Surgery signed off Procedures:  ETT 4/2 >>  L IJ HD 4/6 >> 4/12 R Rad Aline 4/6 >>  4/12 right femoral HD catheter 24 cm>>  Significant Diagnostic Tests:  CT chest 4/01 [name]) >> b/l GGO in periphery CT abd/pelvis 4/01 [name]) >> perforated diverticulum lower descending colon, fatty liver with  ?cirrhosis, splenomegaly 18.4 cm  Micro Data:  COVID 3/30 (PCP office) >> DETECTED Blood 4/12 >> neg COVID at [facility] 08/16/2018 (repeat for clearance) - POSITIVE Tracheal aspirate 4/20 >  Antimicrobials:  Zosyn 4/1 >> 4/7 Zoysn 4/12 >>4/17 Zosyn 4/19 > Vancomycin 4/19 >  Interim history/subjective:  Unfortunately has suffered big set back since Ive seen him last. Now on pressors, heavier sedation, with increased vent need. CXR this morning worse.   Objective   Blood pressure 126/71, pulse (!) 110, temperature 99.5 F (37.5 C), temperature source Axillary, resp. rate (!) 21, height 5\' 10"  (1.778 m), weight 128.3 kg, SpO2 96 %. CVP:  [8 mmHg-19 mmHg] 9 mmHg  Vent Mode: PRVC FiO2 (%):  [80 %-100 %] 90 % Set Rate:  [16 bmp] 16 bmp Vt Set:  [510 mL] 510 mL PEEP:  [8 cmH20] 8 cmH20 Plateau Pressure:  [24 cmH20] 24 cmH20   Intake/Output Summary (Last 24 hours) at 08/17/2018 0807 Last data filed at 08/17/2018 0700 Gross per 24 hour  Intake 5917.85 ml  Output 3560 ml  Net 2357.85 ml   Filed Weights   08/15/18 0500 08/16/18 0500 08/17/18 0500  Weight: 126.2 kg 125.4 kg 128.3 kg    Physical Exam: done from door with assistance from bedside RN who is in the room.  General:  Obese adult male on vent.  Neuro:  Sedated. RASS -3.  HEENT:  [place]/AT, No JVD noted, PERRL Cardiovascular:  Borderline tachycardia, sinus on monitor.  Lungs:  Not ausculated Abdomen:  Soft, non-distended Musculoskeletal:  No acute deformity Skin:  Intact, MMM    LABS    PULMONARY Recent Labs  Lab 08/14/18 1615 08/14/18 1812 08/14/18  1815 08/14/18 1820 08/14/18 1954 08/15/18 0438 08/15/18 2329  PHART 7.442 7.400 7.309* 7.398  --  7.490*  --   PCO2ART 51.3* 56.8* 63.0* 56.0*  --  45.2  --   PO2ART 80.0* 149.0* 40.0* 164*  --  98.8  --   HCO3 35.0* 35.2* 31.6* 33.8* 31.3* 34.4* 25.7  TCO2 37* 37* 34*  --  33*  --  27  O2SAT 96.0 99.0 68.0 99.3 56.0 98.4 54.0    CBC Recent Labs  Lab 08/15/18  0453 08/15/18 2329 08/16/18 0428 08/17/18 0430  HGB 8.2* 8.5* 8.2* 7.0*  HCT 27.5* 25.0* 26.5* 23.7*  WBC 1.4*  --  1.3* 0.8*  PLT 170  --  154 141*    COAGULATION No results for input(s): INR in the last 168 hours.  CARDIAC  No results for input(s): TROPONINI in the last 168 hours. No results for input(s): PROBNP in the last 168 hours.   CHEMISTRY Recent Labs  Lab 08/13/18 2209  08/14/18 0351  08/14/18 1600  08/15/18 0453 08/15/18 1600 08/15/18 2329 08/16/18 0428 08/17/18 0430  NA  --    < > 136   < > 136   < > 136 137 139 134* 136  K  --    < > 3.9   < > 3.9   < > 3.7 3.8 3.5 3.6 4.0  CL  --   --  92*  --  92*  --  92* 94*  --  94* 100  CO2  --   --  29  --  31  --  31 29  --  28 25  GLUCOSE  --   --  184*  --  187*  --  195* 209*  --  177* 245*  BUN  --   --  42*  --  41*  --  42* 43*  --  46* 37*  CREATININE  --   --  3.14*  --  2.97*  --  2.79* 3.05*  --  3.13* 2.68*  CALCIUM  --   --  9.6  --  9.5  --  9.2 7.8*  --  7.6* 6.9*  MG 2.1  --  2.3  --   --   --  2.1  --   --  2.1 1.8  PHOS  --   --  3.4  --  3.5  --  2.3* 2.1*  --  2.0* 3.7   < > = values in this interval not displayed.   Estimated Creatinine Clearance: 43.4 mL/min (A) (by C-G formula based on SCr of 2.68 mg/dL (H)).   LIVER Recent Labs  Lab 08/14/18 1600 08/15/18 0453 08/15/18 1600 08/16/18 0428 08/17/18 0430  AST  --   --   --   --  15  ALT  --   --   --   --  25  ALKPHOS  --   --   --   --  59  BILITOT  --   --   --   --  1.3*  PROT  --   --   --   --  5.5*  ALBUMIN 2.5* 2.3* 2.1* 2.4* 2.0*  2.0*     INFECTIOUS Recent Labs  Lab 08/17/18 0430  LATICACIDVEN 1.4  PROCALCITON 0.88     ENDOCRINE CBG (last 3)  Recent Labs    08/16/18 2303 08/17/18 0428 08/17/18 0804  GLUCAP 156* 216* 190*    CXR: reviewed by me  Resolved Hospital Problem list   Mixed Acidosis, ARDS from COVID-19  Assessment & Plan:   Acute hypoxemic respiratory failure due to  COVID-19 infection  (repeat test for clearance 4/19 remains positive) vs ventilator associated pneumonia. Remains on 100% FiO2 and PEEP 8 this morning.  - Full vent support - VAP bundle - ABG now - Follow CXR - ABX  Septic shock secdonary to PNA as described above. (LA on 4/20 1.4) - Empiric antibiotics as above - Norepinephrine and phenylephrine for MAP goal > 34KAJG  Acute metabolic encephalopathy - RASS goal -1 to -2.  - Continue dilaudid and versed infusions.  Atrial fibrillation with RVR: seems to be in sinus now - Continue amiodarone - Unfortunately need to DC heparin as he is having GI and pulmonary blood loss.   Acute renal failure - CRRT per nephrology.   Anemia critical illness now maybe a component of acute blood loss as well. Some bloody plug in ET suction and melenic stools. Occult blood testing positive.  - Transfuse for HGB < 7.  - DC heparin  DM - per primary  MSK/DERM Unclear if sacral decub    Best practice:  Diet: NPO, tube feedings trickle DVT prophylaxis: SCDs GI prophylaxis: pantoprazole Mobility: bed rest Code Status: No CPR or defibrillation but full medical care Family Communication: Wife Butch Penny called, but no answer and mailbox full. She was updated by Dr Chase Caller 4/19. Disposition: ICU   Georgann Housekeeper, AGACNP-BC [place] Pager [phone] or [phone]  08/17/2018 8:55 AM

## 2018-08-17 NOTE — Progress Notes (Signed)
Notified Pharmacy for replacement of citrate for PBP, paused citrate for now to prevent air going into filter or system. Also, paused calcium gluconate as well. Will check an Ionized Calcium systemically and post filter once pharmacy delivers citrate for PBP.

## 2018-08-18 ENCOUNTER — Inpatient Hospital Stay (HOSPITAL_COMMUNITY): Payer: BLUE CROSS/BLUE SHIELD

## 2018-08-18 DIAGNOSIS — N179 Acute kidney failure, unspecified: Secondary | ICD-10-CM

## 2018-08-18 DIAGNOSIS — J9690 Respiratory failure, unspecified, unspecified whether with hypoxia or hypercapnia: Secondary | ICD-10-CM

## 2018-08-18 DIAGNOSIS — J9601 Acute respiratory failure with hypoxia: Secondary | ICD-10-CM

## 2018-08-18 LAB — POCT I-STAT 7, (LYTES, BLD GAS, ICA,H+H)
Acid-Base Excess: 4 mmol/L — ABNORMAL HIGH (ref 0.0–2.0)
Acid-Base Excess: 4 mmol/L — ABNORMAL HIGH (ref 0.0–2.0)
Acid-Base Excess: 1 mmol/L (ref 0.0–2.0)
Acid-Base Excess: 2 mmol/L (ref 0.0–2.0)
Acid-Base Excess: 4 mmol/L — ABNORMAL HIGH (ref 0.0–2.0)
Acid-base deficit: 3 mmol/L — ABNORMAL HIGH (ref 0.0–2.0)
Acid-base deficit: 2 mmol/L (ref 0.0–2.0)
Acid-base deficit: 1 mmol/L (ref 0.0–2.0)
Acid-base deficit: 1 mmol/L (ref 0.0–2.0)
Bicarbonate: 28.4 mmol/L — ABNORMAL HIGH (ref 20.0–28.0)
Bicarbonate: 25.9 mmol/L (ref 20.0–28.0)
Bicarbonate: 31.5 mmol/L — ABNORMAL HIGH (ref 20.0–28.0)
Bicarbonate: 27.2 mmol/L (ref 20.0–28.0)
Bicarbonate: 27.8 mmol/L (ref 20.0–28.0)
Bicarbonate: 27 mmol/L (ref 20.0–28.0)
Bicarbonate: 28.1 mmol/L — ABNORMAL HIGH (ref 20.0–28.0)
Bicarbonate: 27.7 mmol/L (ref 20.0–28.0)
Bicarbonate: 30.9 mmol/L — ABNORMAL HIGH (ref 20.0–28.0)
Calcium, Ion: 0.97 mmol/L — ABNORMAL LOW (ref 1.15–1.40)
Calcium, Ion: 1.01 mmol/L — ABNORMAL LOW (ref 1.15–1.40)
Calcium, Ion: <0.3 mmol/L — CL (ref 1.15–1.40)
Calcium, Ion: <0.3 mmol/L — CL (ref 1.15–1.40)
Calcium, Ion: 0.99 mmol/L — ABNORMAL LOW (ref 1.15–1.40)
Calcium, Ion: 1.02 mmol/L — ABNORMAL LOW (ref 1.15–1.40)
Calcium, Ion: <0.3 mmol/L — CL (ref 1.15–1.40)
Calcium, Ion: 1.05 mmol/L — ABNORMAL LOW (ref 1.15–1.40)
Calcium, Ion: <0.3 mmol/L — CL (ref 1.15–1.40)
HCT: 22 % — ABNORMAL LOW (ref 39.0–52.0)
HCT: 22 % — ABNORMAL LOW (ref 39.0–52.0)
HCT: 27 % — ABNORMAL LOW (ref 39.0–52.0)
HCT: 27 % — ABNORMAL LOW (ref 39.0–52.0)
HCT: 19 % — ABNORMAL LOW (ref 39.0–52.0)
HCT: 20 % — ABNORMAL LOW (ref 39.0–52.0)
HCT: 26 % — ABNORMAL LOW (ref 39.0–52.0)
HCT: 22 % — ABNORMAL LOW (ref 39.0–52.0)
HCT: 25 % — ABNORMAL LOW (ref 39.0–52.0)
Hemoglobin: 7.5 g/dL — ABNORMAL LOW (ref 13.0–17.0)
Hemoglobin: 7.5 g/dL — ABNORMAL LOW (ref 13.0–17.0)
Hemoglobin: 9.2 g/dL — ABNORMAL LOW (ref 13.0–17.0)
Hemoglobin: 9.2 g/dL — ABNORMAL LOW (ref 13.0–17.0)
Hemoglobin: 6.5 g/dL — CL (ref 13.0–17.0)
Hemoglobin: 6.8 g/dL — CL (ref 13.0–17.0)
Hemoglobin: 8.8 g/dL — ABNORMAL LOW (ref 13.0–17.0)
Hemoglobin: 7.5 g/dL — ABNORMAL LOW (ref 13.0–17.0)
Hemoglobin: 8.5 g/dL — ABNORMAL LOW (ref 13.0–17.0)
O2 Saturation: 99 %
O2 Saturation: 57 %
O2 Saturation: 96 %
O2 Saturation: 25 %
O2 Saturation: 90 %
O2 Saturation: 29 %
O2 Saturation: 88 %
O2 Saturation: 51 %
O2 Saturation: 94 %
Patient temperature: 99.4
Patient temperature: 97.1
Patient temperature: 99.6
Potassium: 3.5 mmol/L (ref 3.5–5.1)
Potassium: 4.3 mmol/L (ref 3.5–5.1)
Potassium: 4.8 mmol/L (ref 3.5–5.1)
Potassium: 4.2 mmol/L (ref 3.5–5.1)
Potassium: 4.4 mmol/L (ref 3.5–5.1)
Potassium: 4 mmol/L (ref 3.5–5.1)
Potassium: 4.3 mmol/L (ref 3.5–5.1)
Potassium: 4 mmol/L (ref 3.5–5.1)
Potassium: 4.3 mmol/L (ref 3.5–5.1)
Sodium: 136 mmol/L (ref 135–145)
Sodium: 136 mmol/L (ref 135–145)
Sodium: 138 mmol/L (ref 135–145)
Sodium: 138 mmol/L (ref 135–145)
Sodium: 137 mmol/L (ref 135–145)
Sodium: 136 mmol/L (ref 135–145)
Sodium: 136 mmol/L (ref 135–145)
Sodium: 136 mmol/L (ref 135–145)
Sodium: 137 mmol/L (ref 135–145)
TCO2: 30 mmol/L (ref 22–32)
TCO2: 28 mmol/L (ref 22–32)
TCO2: 34 mmol/L — ABNORMAL HIGH (ref 22–32)
TCO2: 29 mmol/L (ref 22–32)
TCO2: 30 mmol/L (ref 22–32)
TCO2: 29 mmol/L (ref 22–32)
TCO2: 30 mmol/L (ref 22–32)
TCO2: 30 mmol/L (ref 22–32)
TCO2: 33 mmol/L — ABNORMAL HIGH (ref 22–32)
pCO2 arterial: 39.2 mmHg (ref 32.0–48.0)
pCO2 arterial: 70.1 mmHg (ref 32.0–48.0)
pCO2 arterial: 72.9 mmHg (ref 32.0–48.0)
pCO2 arterial: 67.6 mmHg (ref 32.0–48.0)
pCO2 arterial: 55.8 mmHg — ABNORMAL HIGH (ref 32.0–48.0)
pCO2 arterial: 52.9 mmHg — ABNORMAL HIGH (ref 32.0–48.0)
pCO2 arterial: 67 mmHg (ref 32.0–48.0)
pCO2 arterial: 66 mmHg (ref 32.0–48.0)
pCO2 arterial: 72.7 mmHg (ref 32.0–48.0)
pH, Arterial: 7.468 — ABNORMAL HIGH (ref 7.350–7.450)
pH, Arterial: 7.161 — CL (ref 7.350–7.450)
pH, Arterial: 7.261 — ABNORMAL LOW (ref 7.350–7.450)
pH, Arterial: 7.208 — ABNORMAL LOW (ref 7.350–7.450)
pH, Arterial: 7.306 — ABNORMAL LOW (ref 7.350–7.450)
pH, Arterial: 7.212 — ABNORMAL LOW (ref 7.350–7.450)
pH, Arterial: 7.333 — ABNORMAL LOW (ref 7.350–7.450)
pH, Arterial: 7.19 — CL (ref 7.350–7.450)
pH, Arterial: 7.281 — ABNORMAL LOW (ref 7.350–7.450)
pO2, Arterial: 132 mmHg — ABNORMAL HIGH (ref 83.0–108.0)
pO2, Arterial: 101 mmHg (ref 83.0–108.0)
pO2, Arterial: 40 mmHg — CL (ref 83.0–108.0)
pO2, Arterial: 21 mmHg — CL (ref 83.0–108.0)
pO2, Arterial: 66 mmHg — ABNORMAL LOW (ref 83.0–108.0)
pO2, Arterial: 23 mmHg — CL (ref 83.0–108.0)
pO2, Arterial: 59 mmHg — ABNORMAL LOW (ref 83.0–108.0)
pO2, Arterial: 35 mmHg — CL (ref 83.0–108.0)
pO2, Arterial: 86 mmHg (ref 83.0–108.0)

## 2018-08-18 LAB — CBC
HCT: 23.2 % — ABNORMAL LOW (ref 39.0–52.0)
HCT: 21.6 % — ABNORMAL LOW (ref 39.0–52.0)
Hemoglobin: 7 g/dL — ABNORMAL LOW (ref 13.0–17.0)
Hemoglobin: 6.6 g/dL — CL (ref 13.0–17.0)
MCH: 28.6 pg (ref 26.0–34.0)
MCH: 29.5 pg (ref 26.0–34.0)
MCHC: 30.2 g/dL (ref 30.0–36.0)
MCHC: 30.6 g/dL (ref 30.0–36.0)
MCV: 94.7 fL (ref 80.0–100.0)
MCV: 96.4 fL (ref 80.0–100.0)
Platelets: 165 10*3/uL (ref 150–400)
Platelets: 129 10*3/uL — ABNORMAL LOW (ref 150–400)
RBC: 2.45 MIL/uL — ABNORMAL LOW (ref 4.22–5.81)
RBC: 2.24 MIL/uL — ABNORMAL LOW (ref 4.22–5.81)
RDW: 19.2 % — ABNORMAL HIGH (ref 11.5–15.5)
RDW: 19.6 % — ABNORMAL HIGH (ref 11.5–15.5)
WBC: 1 10*3/uL — CL (ref 4.0–10.5)
WBC: 0.6 10*3/uL — CL (ref 4.0–10.5)
nRBC: 2 % — ABNORMAL HIGH (ref 0.0–0.2)
nRBC: 3.4 % — ABNORMAL HIGH (ref 0.0–0.2)

## 2018-08-18 LAB — BASIC METABOLIC PANEL
Anion gap: 12 (ref 5–15)
BUN: 55 mg/dL — ABNORMAL HIGH (ref 6–20)
CO2: 28 mmol/L (ref 22–32)
Calcium: 7.6 mg/dL — ABNORMAL LOW (ref 8.9–10.3)
Chloride: 97 mmol/L — ABNORMAL LOW (ref 98–111)
Creatinine, Ser: 3.53 mg/dL — ABNORMAL HIGH (ref 0.61–1.24)
GFR calc Af Amer: 22 mL/min — ABNORMAL LOW (ref >60)
GFR calc non Af Amer: 19 mL/min — ABNORMAL LOW (ref >60)
Glucose, Bld: 207 mg/dL — ABNORMAL HIGH (ref 70–99)
Potassium: 5 mmol/L (ref 3.5–5.1)
Sodium: 137 mmol/L (ref 135–145)

## 2018-08-18 LAB — RENAL FUNCTION PANEL
Albumin: 1.9 g/dL — ABNORMAL LOW (ref 3.5–5.0)
Albumin: 1.8 g/dL — ABNORMAL LOW (ref 3.5–5.0)
Anion gap: 12 (ref 5–15)
Anion gap: 13 (ref 5–15)
BUN: 55 mg/dL — ABNORMAL HIGH (ref 6–20)
BUN: 61 mg/dL — ABNORMAL HIGH (ref 6–20)
CO2: 28 mmol/L (ref 22–32)
CO2: 26 mmol/L (ref 22–32)
Calcium: 7.6 mg/dL — ABNORMAL LOW (ref 8.9–10.3)
Calcium: 7.7 mg/dL — ABNORMAL LOW (ref 8.9–10.3)
Chloride: 96 mmol/L — ABNORMAL LOW (ref 98–111)
Chloride: 98 mmol/L (ref 98–111)
Creatinine, Ser: 3.52 mg/dL — ABNORMAL HIGH (ref 0.61–1.24)
Creatinine, Ser: 3.52 mg/dL — ABNORMAL HIGH (ref 0.61–1.24)
GFR calc Af Amer: 22 mL/min — ABNORMAL LOW (ref >60)
GFR calc Af Amer: 22 mL/min — ABNORMAL LOW (ref >60)
GFR calc non Af Amer: 19 mL/min — ABNORMAL LOW (ref >60)
GFR calc non Af Amer: 19 mL/min — ABNORMAL LOW (ref >60)
Glucose, Bld: 204 mg/dL — ABNORMAL HIGH (ref 70–99)
Glucose, Bld: 241 mg/dL — ABNORMAL HIGH (ref 70–99)
Phosphorus: 5.1 mg/dL — ABNORMAL HIGH (ref 2.5–4.6)
Phosphorus: 3.3 mg/dL (ref 2.5–4.6)
Potassium: 5 mmol/L (ref 3.5–5.1)
Potassium: 4.8 mmol/L (ref 3.5–5.1)
Sodium: 136 mmol/L (ref 135–145)
Sodium: 137 mmol/L (ref 135–145)

## 2018-08-18 LAB — POCT I-STAT EG7
Acid-base deficit: 2 mmol/L (ref 0.0–2.0)
Bicarbonate: 26.6 mmol/L (ref 20.0–28.0)
Calcium, Ion: <0.3 mmol/L — CL (ref 1.15–1.40)
HCT: 25 % — ABNORMAL LOW (ref 39.0–52.0)
Hemoglobin: 8.5 g/dL — ABNORMAL LOW (ref 13.0–17.0)
O2 Saturation: 58 %
Potassium: 4.2 mmol/L (ref 3.5–5.1)
Sodium: 138 mmol/L (ref 135–145)
TCO2: 29 mmol/L (ref 22–32)
pCO2, Ven: 69.1 mmHg — ABNORMAL HIGH (ref 44.0–60.0)
pH, Ven: 7.194 — CL (ref 7.250–7.430)
pO2, Ven: 38 mmHg (ref 32.0–45.0)

## 2018-08-18 LAB — GLUCOSE, CAPILLARY
Glucose-Capillary: 179 mg/dL — ABNORMAL HIGH (ref 70–99)
Glucose-Capillary: 157 mg/dL — ABNORMAL HIGH (ref 70–99)
Glucose-Capillary: 168 mg/dL — ABNORMAL HIGH (ref 70–99)
Glucose-Capillary: 210 mg/dL — ABNORMAL HIGH (ref 70–99)
Glucose-Capillary: 192 mg/dL — ABNORMAL HIGH (ref 70–99)
Glucose-Capillary: 265 mg/dL — ABNORMAL HIGH (ref 70–99)

## 2018-08-18 LAB — ABO/RH: ABO/RH(D): O POS

## 2018-08-18 LAB — APTT: aPTT: 31 seconds (ref 24–36)

## 2018-08-18 LAB — PROCALCITONIN: Procalcitonin: 3.7 ng/mL

## 2018-08-18 LAB — MAGNESIUM: Magnesium: 2.1 mg/dL (ref 1.7–2.4)

## 2018-08-18 LAB — CALCIUM, IONIZED: Calcium, Ionized, Serum: <3 mg/dL — ABNORMAL LOW (ref 4.5–5.6)

## 2018-08-18 MED ORDER — PIPERACILLIN-TAZOBACTAM 3.375 G IVPB: 3.3750 g | Freq: Four times a day (QID) | INTRAVENOUS | Status: DC

## 2018-08-18 MED ORDER — HEPARIN SODIUM (PORCINE) 1000 UNIT/ML DIALYSIS
1000.0000 [IU] | INTRAMUSCULAR | Status: DC | PRN
  Administered 2018-08-18: 09:00:00 3200 [IU] via INTRAVENOUS_CENTRAL
  Administered 2018-08-18: 3000 [IU] via INTRAVENOUS_CENTRAL
  Administered 2018-08-18: 2400 [IU] via INTRAVENOUS_CENTRAL
  Filled 2018-08-18: qty 6
  Filled 2018-08-18: qty 3
  Filled 2018-08-18: qty 6
  Filled 2018-08-18: qty 3
  Filled 2018-08-18: qty 6

## 2018-08-18 MED ORDER — PIPERACILLIN-TAZOBACTAM 3.375 G IVPB 30 MIN
3.3750 g | Freq: Four times a day (QID) | INTRAVENOUS | Status: DC
  Administered 2018-08-18 – 2018-08-20 (×9): 3.375 g via INTRAVENOUS
  Filled 2018-08-18 (×16): qty 50

## 2018-08-18 MED ORDER — PRO-STAT SUGAR FREE PO LIQD
60.0000 mL | Freq: Two times a day (BID) | ORAL | Status: DC
  Administered 2018-08-18 – 2018-08-22 (×9): 60 mL
  Filled 2018-08-18 (×9): qty 60

## 2018-08-18 MED ORDER — HEPARIN SODIUM (PORCINE) 1000 UNIT/ML IJ SOLN
INTRAMUSCULAR | Status: AC
  Filled 2018-08-18: qty 3

## 2018-08-18 MED ORDER — VITAL HIGH PROTEIN PO LIQD
1000.0000 mL | ORAL | Status: DC
  Administered 2018-08-20 – 2018-08-22 (×3): 1000 mL

## 2018-08-18 NOTE — Progress Notes (Signed)
Patient ID: [name] Long, male   DOB: May 24, 1965, 53 y.o.   MRN: [id number] S: HD catheter clotted again this morning and is s/p new temp RIJ trialysis catheter thanks to PCCM.  Oxygen requirements are down, although PEEP is up to 12. O:BP (!) 147/59   Pulse (!) 107   Temp 98.3 F (36.8 C) (Oral)   Resp 12   Ht '5\' 10"'  (1.778 m)   Wt 133.4 kg   SpO2 94%   BMI 42.20 kg/m   Intake/Output Summary (Last 24 hours) at 08/18/2018 1353 Last data filed at 08/18/2018 1300 Gross per 24 hour  Intake 7162.38 ml  Output 2541 ml  Net 4621.38 ml   Intake/Output: I/O last 3 completed shifts: In: 8332.5 [I.V.:5221.1; Other:140; NG/GT:2512.9; IV Piggyback:458.4] Out: 3875 [Other:5601; Stool:80]  Intake/Output this shift:  Total I/O In: 3657.2 [I.V.:2743.3; Other:180; NG/GT:583.9; IV Piggyback:150] Out: 416 [Other:416] Weight change: 5.1 kg Gen: intubated and critically ill Direct physical contact was not performed due to concerns over the covid-19 pandemic and physical exam was discussed with PCCM.  Recent Labs  Lab 08/14/18 1600  08/15/18 0453  08/15/18 1600  08/16/18 0428  08/17/18 0430  08/17/18 1441 08/17/18 1620 08/17/18 1642 08/17/18 1650 08/17/18 1833 08/17/18 1840 08/18/18 0323  NA 136   < > 138  136   < > 137   < > 134*   < > 136   < > 137 137 136 136 136 137 136  137  K 3.9   < > 3.8  3.7   < > 3.8   < > 3.6   < > 4.0   < > 4.4 4.5 4.0 4.3 4.3 4.0 5.0  5.0  CL 92*  --  92*  --  94*  --  94*  --  100  --   --  95*  --   --   --   --  96*  97*  CO2 31  --  31  --  29  --  28  --  25  --   --  28  --   --   --   --  28  28  GLUCOSE 187*  --  195*  --  209*  --  177*  --  245*  --   --  269*  --   --   --   --  204*  207*  BUN 41*  --  42*  --  43*  --  46*  --  37*  --   --  42*  --   --   --   --  55*  55*  CREATININE 2.97*  --  2.79*  --  3.05*  --  3.13*  --  2.68*  --   --  2.93*  --   --   --   --  3.52*  3.53*  ALBUMIN 2.5*  --  2.3*  --  2.1*  --  2.4*  --  2.0*   2.0*  --   --  2.0*  --   --   --   --  1.9*  CALCIUM 9.5  --  9.2  --  7.8*  --  7.6*  --  6.9*  --   --  7.7*  --   --   --   --  7.6*  7.6*  PHOS 3.5  --  2.3*  --  2.1*  --  2.0*  --  3.7  --   --  3.2  --   --   --   --  5.1*  AST  --   --   --   --   --   --   --   --  15  --   --   --   --   --   --   --   --   ALT  --   --   --   --   --   --   --   --  25  --   --   --   --   --   --   --   --    < > = values in this interval not displayed.   Liver Function Tests: Recent Labs  Lab 08/17/18 0430 08/17/18 1620 08/18/18 0323  AST 15  --   --   ALT 25  --   --   ALKPHOS 59  --   --   BILITOT 1.3*  --   --   PROT 5.5*  --   --   ALBUMIN 2.0*  2.0* 2.0* 1.9*   No results for input(s): LIPASE, AMYLASE in the last 168 hours. No results for input(s): AMMONIA in the last 168 hours. CBC: Recent Labs  Lab 08/13/18 0420  08/15/18 0453  08/16/18 0428  08/17/18 0430  08/17/18 1620  08/17/18 1833 08/17/18 1840 08/18/18 0323  WBC 2.9*   < > 1.4*  --  1.3*  --  0.8*  --  0.8*  --   --   --  1.0*  NEUTROABS 1.8  --   --   --   --   --   --   --   --   --   --   --   --   HGB 8.5*   < > 10.2*  8.2*   < > 8.2*   < > 7.0*   < > 6.8*   < > 7.5* 8.5* 7.0*  HCT 26.4*   < > 30.0*  27.5*   < > 26.5*   < > 23.7*   < > 22.5*   < > 22.0* 25.0* 23.2*  MCV 92.0   < > 94.2  --  93.3  --  96.7  --  95.7  --   --   --  94.7  PLT 137*   < > 170  --  154  --  141*  --  159  --   --   --  165   < > = values in this interval not displayed.   Cardiac Enzymes: No results for input(s): CKTOTAL, CKMB, CKMBINDEX, TROPONINI in the last 168 hours. CBG: Recent Labs  Lab 08/17/18 1949 08/17/18 2339 08/18/18 0331 08/18/18 0809 08/18/18 1116  GLUCAP 204* 209* 179* 157* 168*    Iron Studies: No results for input(s): IRON, TIBC, TRANSFERRIN, FERRITIN in the last 72 hours. Studies/Results: Dg Chest Port 1 View  Result Date: 08/18/2018 CLINICAL DATA:  Central line placement EXAM: PORTABLE CHEST 1  VIEW COMPARISON:  08/18/2018 FINDINGS: Endotracheal tube with the tip 4.4 cm above the carina. Nasogastric tube coursing below the diaphragm. Right jugular central venous catheter with the tip projecting over the SVC. Bilateral interstitial and alveolar airspace opacities diffusely throughout the lungs. No pleural effusion or pneumothorax. Stable cardiomegaly. No acute osseous abnormality. IMPRESSION: 1. Support lines and tubing in satisfactory position. 2. Bilateral interstitial and alveolar airspace opacities which may reflect multilobar pneumonia  versus pulmonary edema. Electronically Signed   By: Kathreen Devoid   On: 08/18/2018 10:06   Dg Chest Port 1 View  Result Date: 08/18/2018 CLINICAL DATA:  53 year old male with COVID-19. Respiratory failure. EXAM: PORTABLE CHEST 1 VIEW COMPARISON:  08/17/2018 and earlier. FINDINGS: Portable AP semi upright view at 0442 hours. Stable endotracheal tube and enteric tube. Coarse interstitial and patchy bilateral pulmonary opacity appears mildly regressed since yesterday, in addition to larger lung volumes. Opacity remains increased compared to 08/16/2018. No pneumothorax or pleural effusion. Paucity bowel gas in the upper abdomen. IMPRESSION: 1.  Stable lines and tubes. 2. Mildly larger lung volumes and improved bilateral ventilation since yesterday with continued widespread coarse and patchy pulmonary opacity. Electronically Signed   By: Genevie Ann M.D.   On: 08/18/2018 06:59   Dg Chest Port 1 View  Result Date: 08/17/2018 CLINICAL DATA:  53 year old male positive COVID-19. EXAM: PORTABLE CHEST 1 VIEW COMPARISON:  08/16/2018 and earlier. FINDINGS: Portable AP semi upright view at 0349 hours. Endotracheal tube tip at the level the clavicles. Enteric tube courses to the abdomen, tip not included. Worsening bilateral indistinct pulmonary opacity since 08/10/2018 with peripheral and lower lung predominance. Mediastinal contours appear stable. No associated pneumothorax. No  pleural effusion is evident. Paucity of bowel gas in the upper abdomen. IMPRESSION: 1.  Stable lines and tubes. 2. Progressed bilateral indistinct pulmonary opacity suggesting progressed COVID-19 respiratory disease. Electronically Signed   By: Genevie Ann M.D.   On: 08/17/2018 06:26   Dg Abd Portable 1v  Result Date: 08/17/2018 CLINICAL DATA:  53 year old male with abdominal distension. EXAM: PORTABLE ABDOMEN - 1 VIEW COMPARISON:  Prior abdominal radiograph 08/10/2018 FINDINGS: Well-positioned gastric tube. The tip of the tube overlies the gastric antrum in unchanged position. No significant gaseous distension or evidence of obstruction. No acute osseous abnormality. Partially imaged right femoral non tunneled hemodialysis catheter. IMPRESSION: 1. Stable position of gastric tube with the tip overlying the gastric antrum. 2. No evidence of bowel obstruction. Electronically Signed   By: Jacqulynn Cadet M.D.   On: 08/17/2018 13:42   . sodium chloride   Intravenous Once  . chlorhexidine gluconate (MEDLINE KIT)  15 mL Mouth Rinse BID  . Chlorhexidine Gluconate Cloth  6 each Topical Daily  . feeding supplement (PRO-STAT SUGAR FREE 64)  60 mL Per Tube BID  . heparin      . heparin injection (subcutaneous)  5,000 Units Subcutaneous Q12H  . insulin aspart  0-9 Units Subcutaneous Q4H  . mouth rinse  15 mL Mouth Rinse 10 times per day  . pantoprazole (PROTONIX) IV  40 mg Intravenous Q24H  . sodium chloride flush  10-40 mL Intracatheter Q12H    BMET    Component Value Date/Time   NA 136 08/18/2018 0323   NA 137 08/18/2018 0323   K 5.0 08/18/2018 0323   K 5.0 08/18/2018 0323   CL 96 (L) 08/18/2018 0323   CL 97 (L) 08/18/2018 0323   CO2 28 08/18/2018 0323   CO2 28 08/18/2018 0323   GLUCOSE 204 (H) 08/18/2018 0323   GLUCOSE 207 (H) 08/18/2018 0323   BUN 55 (H) 08/18/2018 0323   BUN 55 (H) 08/18/2018 0323   CREATININE 3.52 (H) 08/18/2018 0323   CREATININE 3.53 (H) 08/18/2018 0323   CALCIUM 7.6 (L)  08/18/2018 0323   CALCIUM 7.6 (L) 08/18/2018 0323   GFRNONAA 19 (L) 08/18/2018 0323   GFRNONAA 19 (L) 08/18/2018 0323   GFRAA 22 (L) 08/18/2018 1941  GFRAA 22 (L) 08/18/2018 0323   CBC    Component Value Date/Time   WBC 1.0 (LL) 08/18/2018 0323   RBC 2.45 (L) 08/18/2018 0323   HGB 7.0 (L) 08/18/2018 0323   HCT 23.2 (L) 08/18/2018 0323   PLT 165 08/18/2018 0323   MCV 94.7 08/18/2018 0323   MCH 28.6 08/18/2018 0323   MCHC 30.2 08/18/2018 0323   RDW 19.2 (H) 08/18/2018 0323   LYMPHSABS 0.6 (L) 08/13/2018 0420   MONOABS 0.4 08/13/2018 0420   EOSABS 0.1 08/13/2018 0420   BASOSABS 0.0 08/13/2018 0420    Assessment/Plan:  1. ARF in setting of coronavirus sepsis.  Presumably due to ischemic ATN although unclear if there is direct viral injury with covid-19.  He has been receiving CVVHD since 08/02/18 in setting of worsening respiratory failure and metabolic acidosis.   1. HD catheter was changed to right fem site on 08/09/18 and again today 08/18/18 due to poor function and frequent clotting. 2. Was kept + due to hypotension and bradycardia, however since he was off of CVVHD for a length of time today, he is almost 5 liters +.  Will start to UF as tolerated due to worsening oxygenation/respiratory status. 2. Coronavirus PNA with sepsis and VDRF- intubated since 08/02/18.   3. ARDS- concern for possible HCAP on top of covid-19.  Now on zosyn and vancomycin per PCCM.  Will likely require prone ventilation again. 4. Septic shock- vasopressin added due to Afib with RVR related to levophed.  Per PCCM 5. ABLA- off of heparin.   6. Hypophosphatemia- replete as needed 7. Disposition- poor overall prognosis. 8. Perforated diverticulum  Donetta Potts, MD [facility] [phone]

## 2018-08-18 NOTE — Consult Note (Signed)
[place] Nurse wound consult note Patient receiving care in [place].  On COVID 19 isolation.   Reason for Consult: "DTI" Wound type: According to my conversation with the charge nurse, Darrick Meigs, the area is described as about a "half dollar size with necrotic tissue and eschar".  This is consistent with an unstageable wound. Pressure Injury POA: No Measurement: half dollar size Wound bed: necrotic tissue and eschar Dressing procedure/placement/frequency: Place saline moistened gauze over wound on sacral/coccyx area.  Cover with ABD pads. Tape in place. Photo of wound not in record.  Patient undergoing a beside medical procedure. Monitor the wound area(s) for worsening of condition such as: Signs/symptoms of infection,  Increase in size,  Development of or worsening of odor, Development of pain, or increased pain at the affected locations.  Notify the medical team if any of these develop.  Thank you for the consult.  Discussed plan of care with the Charge nurse, Darrick Meigs.  [place] nurse will not follow at this time.  Please re-consult the [place] team if needed.  Val Riles, RN, MSN, CWOCN, CNS-BC, pager [phone]

## 2018-08-18 NOTE — Progress Notes (Signed)
Pharmacy Antibiotic Note  [name] Long is a 53 y.o. male admitted on 08/24/2018 with sepsis.  Pharmacy has been consulted for vanc/zosyn dosing. Patient has been treated already with Zosyn for diverticulitis and has been positive for COVID-19 currently sedated on vent and CRRT as well as pressors.  Today, 08/18/2018 - Tm 99.9 - WBC 1.0, down - SCr 3.53, up with CRRT off since 4/20 pm - PCT 3.7  Plan: 1) Increase Zosyn 3.375g IV q6 for CRRT 2) Continue Vancomycin 1g IV q24 - will give today's dose later today since CRRT has been off >12hrs (resumed today ~11am); plan to check trough in 3-4 days if continues  Height: 5\' 10"  (177.8 cm) Weight: 294 lb 1.5 oz (133.4 kg) IBW/kg (Calculated) : 73  Temp (24hrs), Avg:99.2 F (37.3 C), Min:98 F (36.7 C), Max:100.4 F (38 C)  Recent Labs  Lab 08/15/18 0453 08/15/18 1600 08/16/18 0428 08/17/18 0430 08/17/18 1100 08/17/18 1620 08/18/18 0323  WBC 1.4*  --  1.3* 0.8*  --  0.8* 1.0*  CREATININE 2.79* 3.05* 3.13* 2.68*  --  2.93* 3.52*  3.53*  LATICACIDVEN  --   --   --  1.4 2.1*  --   --     Estimated Creatinine Clearance: 33.8 mL/min (A) (by C-G formula based on SCr of 3.52 mg/dL (H)).    No Known Allergies   Antimicrobials this admission: Zosyn 4/2 >> 4/7, then resumed 4/12 >> 4/18, 4/19 >> Azithromycin 4/2 >> 4/6 Hydroxychloroquine 4/2 >> 4/6 Vancomycin 4/12 >> 4/13, 4/19 >>   Dose adjustments this admission: 4/6: Zosyn changed to 2.25g q6h for CRRT 4/21: Zosyn increased to 3.375g q6h for CRRT  Microbiology results: 3/30: COVID +  outpt PTA  4/2 MRSA PCR: negative  4/2 hepatitis panel: neg 4/12 BCx: NGTD 4/19 repeat Covid for clearance: positive 4/20 Trach asp:  Thank you for allowing pharmacy to be a part of this patient's care.  Peggyann Juba, PharmD, BCPS Pager: [phone] 08/18/2018 11:33 AM

## 2018-08-18 NOTE — Progress Notes (Signed)
NAME:  [name] Long, MRN:  [id number], DOB:  27-Sep-1965, LOS: 72 ADMISSION DATE:  08/06/2018, CONSULTATION DATE:  07/30/2018 REFERRING MD:  07/30/2018, CHIEF COMPLAINT:   Brief History   53 yo male presented to [name] with several days of abdominal pain from diverticulitis with retained perforation.  Developed pneumonia with hypoxia from [name].  Transferred to [name]. Actemra not given due to diverticulitis  Past Medical History  DM2, HTN, Former smoker, OSA, A fib  [name] Events   4/01 admission for diverticulitis 4/02 PCCM consulted for worsening dyspnea, hypoxemia 4/02 Proned  4/03 Proned  4/05 Improved oxygenation however ABG with respiratory and metabolic acidosis. Poor UOP 4/06 Supine; completed plaquenil 4/07 CVVHD, difficulty with filters clotting  4/08 CVVHD ongoing, AFwRVR afternoon of 4/8, started on amio gtt   4/12 hemodialysis catheter changed to right femoral location; brief episode of PEA resolved sponateously 08/11/2018 hemodialysis catheter and filter for CRRT machine or clotting off. 08/11/2018 started on heparin drip for atrial fibrillation in filter clotting on CRRT machine 4/18- Was off sedation transiently 4/17, then had to be started on Precedex. He has tolerated pressure support 8 very well, used PRVC auto mode for the rest of the day yesterday 4/17 Had some agitation requiring increased fentanyl, sedation overnight.  Resultant hypotension and low-dose norepinephrine started.  Now weaned to off.  4/19: had episode of bradycardia, hypotension, hypoxia. Etiology unclear. Started back on pressors and deep sedation.  4/20: X-ray looks worse.  Added HCAP coverage on 4/19, still requiring high PEEP and FiO2 4/21:peep and fio2 down, remains heavily sedated, still on pressors.  Placed new dialysis catheter as femoral had been clotting off  Consults:  Renal GI signed off Surgery signed off Procedures:  ETT 4/2 >>  L IJ HD 4/6 >> 4/12 R Rad Aline 4/6 >>  4/12  right femoral HD catheter 24 cm>> Right IJ triple-lumen dialysis catheter 4/21>>> Significant Diagnostic Tests:  CT chest 4/01 [name]) >> b/l GGO in periphery CT abd/pelvis 4/01 [name]) >> perforated diverticulum lower descending colon, fatty liver with ?cirrhosis, splenomegaly 18.4 cm  Micro Data:  COVID 3/30 (PCP office) >> DETECTED Blood 4/12 >> neg COVID at [facility] 08/16/2018 (repeat for clearance) - POSITIVE Tracheal aspirate 4/20 >  Antimicrobials:  Zosyn 4/1 >> 4/7 Zoysn 4/12 >>4/17 Zosyn 4/19 > Vancomycin 4/19 >  Interim history/subjective:   Dialysis catheter clotted off.  New one placed Remains on pressors Objective   Blood pressure (Abnormal) 147/59, pulse 90, temperature 99.6 F (37.6 C), temperature source Axillary, resp. rate 20, height 5\' 10"  (1.778 m), weight 133.4 kg, SpO2 99 %. CVP:  [9 mmHg-14 mmHg] 14 mmHg  Vent Mode: PRVC FiO2 (%):  [60 %-90 %] 60 % Set Rate:  [16 bmp-20 bmp] 20 bmp Vt Set:  [510 mL] 510 mL PEEP:  [8 cmH20-12 cmH20] 12 cmH20 Plateau Pressure:  [14 cmH20-22 cmH20] 22 cmH20   Intake/Output Summary (Last 24 hours) at 08/18/2018 0818 Last data filed at 08/18/2018 0600 Gross per 24 hour  Intake 4886.68 ml  Output 3052 ml  Net 1834.68 ml   Filed Weights   08/16/18 0500 08/17/18 0500 08/18/18 0359  Weight: 125.4 kg 128.3 kg 133.4 kg   General: This is an obese 53 year old white male who remains heavily sedated on Dilaudid and Versed infusion he is unresponsive currently HEENT normocephalic atraumatic neck is large and unable to appreciate jugular venous distention.  He now has a right IJ triple-lumen dialysis catheter this dressing is intact.  There  are what appears to be viral blisters on lips Pulmonary: Equal chest rise bilaterally scattered rhonchi diminished bases Cardiac: Regular irregular on telemetry no murmur rub or gallop appreciated Abdomen: Obese, firm to palpation positive bowel sounds liquid stool GU: Minimal urine output  Neuro: Heavily sedated unresponsive currently Extremities: Cool, pulses weak, generalized anasarca  Res[facility] Problem list   Mixed Acidosis, ARDS from COVID-19  Assessment & Plan:   Acute hypoxemic respiratory failure due to  COVID-19 infection (repeat test for clearance 4/19 remains positive) complicated by presumed ventilator associated pneumonia and ARDS -PCXR personally reviewed: Endotracheal tubes in satisfactory position.  Left PICC line in satisfactory position.  Ongoing patchy bilateral airspace disease however aeration looks improved when comparing film from [name] ARDS protocol PAD protocol RA SS goal -2 VAP bundle Day number 3 nosocomial coverage with vancomycin and Zosyn Follow-up sputum culture  Septic shock presume repeat episode of shock 2/2 HCAP Plan Keep euvolemic Titrate Neo-Synephrine for mean arterial pressure greater than 65 Continue vasopressin  Acute metabolic encephalopathy Plan RASS goal: Changed to -1 to -2 Titrate versed and dilaudid   Atrial fibrillation with RVR: seems to be in sinus now Plan Cont amio Holding AC due to blood loss.  Acute renal failure; now w/ HD access issues as of 4/21 Plan Will likely need new HD cath CRRT per nephro Adjust meds appropriately  Am chemistry   Anemia critical illness now maybe a component of acute blood loss as well. Some bloody plug in ET suction and melenic stools. Occult blood testing positive.  -hgb transfused yesterday  Plan Trend cbc Transfuse per protocol for hgb <7   DM Plan ssi   MSK/DERM Plan Wound care    Best practice:  Diet: NPO, tube feedings trickle DVT prophylaxis: SCDs GI prophylaxis: pantoprazole Mobility: bed rest Code Status: No CPR or defibrillation but full medical care Family Communication: I updated the wife via telephone today on 4/21 Disposition: ICU he remains critically ill due to septic shock with multiorgan failure.  Seemingly his  course has been complicated by VAP no organism specified per day.  He is on broad-spectrum antibiotics, a new catheter is been placed for dialysis.  We will continue supportive care.  He is now on 53 days of mechanical ventilation, I suspect we will be looking at tracheostomy soon.  We will try to decrease sedation today  Erick Colace ACNP-BC [place] Pager # [phone] OR # [phone] if no answer  08/18/2018 8:18 AM

## 2018-08-18 NOTE — Progress Notes (Addendum)
Nutrition Follow-up  RD working remotely.   DOCUMENTATION CODES:   Morbid obesity  INTERVENTION:  - will adjust TF regimen now that patient tolerating TF at goal rate: Vital High Protein @ 65 ml with 60 ml prostat BID. - this regimen will provide 1960 kcal (105% estimated kcal need), 196 grams protein, and 1304 ml free water.  - free water flush, if desired, to be per MD/NP/Nephrology. - recommend 500 mg ascorbic acid BID and 220 mg zinc or 1 tablet Ocuvite/day to aid in wound healing.    NUTRITION DIAGNOSIS:   Inadequate oral intake related to inability to eat as evidenced by NPO status. -ongoing  GOAL:   Provide needs based on ASPEN/SCCM guidelines -met with TF regimen   MONITOR:   Vent status, TF tolerance, Labs, Weight trends, Skin, I & O's  ASSESSMENT:   53 year-old male admitted on 4/1 with abdominal pain which had been persistent for several days; noted to have diverticulitis at [facility] and was transferred to [facility] for further evaluation. Patient found to be COVID POSITIVE at PCP office earlier in the week. PCCM consulted 4/2 for worsening hypoxemia.   Significant Events: 4/1- admitted 4/2- intubated and OGT placed; proned; triple lumen PICC placed 4/3- proned 4/5- CRRT initiation 4/6- supine 4/9- CRRT stopped d/t persistent filter clotting, inabilitytoresolve issue; trickle rate TF initiated; CRRT re-started 4/10- TF increased to 35 ml/hr with plan to advance by 10 ml every 8 hours to goal of 65 ml/hr 4/11- TF placed on hold duehematochezia 4/12- HD cath moved to R femoral area; brief PEA episode which spontaneously resolved 4/14- HD cath and CRRT filters continuing to clog; started on heparin 4/15- plan to re-start TF at 10 ml/hr 4/17- begin advancing TF 4/20- TF reached goal rate in the early AM   Current weight consistent with admission (4/1) weight. Patient remains intubated with OGT in place and is receiving TF regimen at goal rate:  Vital High Protein @ 65 ml/hr with 60 ml prostat TID. This regimen provides 2160 kcal, 226 grams protein, and 1304 ml free water.   Per Nephrology note yesterday: HD cath will need exchanged this week, poor overall prognosis.  Per CCM note yesterday: ARDS, acute hypoxemic respiratory failure d/t COVID-19 with repeat test 4/19 still positive, septic shock 2/2 PNA, acute metabolic encephalopathy, afib with RVR, acute renal failure continues on CRRT, anemia of critical illness, CXR 4/20 showing worsening.. Patient is limited code (no CPR or defib).   Patient is currently intubated on ventilator support MV: 11.1 L/min as of 8:26 AM Temp (24hrs), Avg:99.3 F (37.4 C), Min:98 F (36.7 C), Max:100.4 F (38 C) Propofol: none  Medications reviewed; sliding scale novolog. Labs reviewed; CBG: 179 mg/dl, Cl: 96 mmol/l, BUN: 55 mg/dl, creatinine: 3.52 mg/dl, Ca: 7.6 mg/dl, Phos: 5.1 mg/dl, GFR: 22 ml/min.  Drips; amiodarone @ 30 mg/hr, hydromorphone @ 4 mg/hr, vaso @ 0.03 units/min.      NUTRITION - FOCUSED PHYSICAL EXAM:  unable to complete at this time.   Diet Order:   Diet Order            Diet NPO time specified  Diet effective now              EDUCATION NEEDS:   No education needs have been identified at this time  Skin:  Skin Assessment: Skin Integrity Issues: Skin Integrity Issues:: DTI, Stage II, Unstageable DTI: coccyx (4/12) Stage II: bilateral face (4/10) Unstageable: full thickness to cervical spine area (4/15)  Last BM:  4/21  Height:   Ht Readings from Last 1 Encounters:  08/10/18 '5\' 10"'  (1.778 m)    Weight:   Wt Readings from Last 1 Encounters:  08/18/18 133.4 kg    Ideal Body Weight:  75.45 kg  BMI:  Body mass index is 42.2 kg/m.  Estimated Nutritional Needs:   Kcal:  5374-8270 kcal  Protein:  >/= 189 grams  Fluid:  >/= 2.2 L/day     Jarome Matin, MS, RD, LDN, [facility] Inpatient Clinical Dietitian Pager # [phone] After hours/weekend  pager # [phone]

## 2018-08-18 NOTE — Procedures (Signed)
Hemodialysis Catheter Insertion Procedure Note [name] Long [id number] [birth year]  Procedure: Insertion of Hemodialysis Catheter Indications: Dialysis Access   Procedure Details Consent: Risks of procedure as well as the alternatives and risks of each were explained to the (patient/caregiver).  Consent for procedure obtained. Time Out: Verified patient identification, verified procedure, site/side was marked, verified correct patient position, special equipment/implants available, medications/allergies/relevent history reviewed, required imaging and test results available.  Performed Real time [place] used to ID and cannulate vessel  Maximum sterile technique was used including antiseptics, cap, gloves, gown, hand hygiene, mask and sheet. Skin prep: Chlorhexidine; local anesthetic administered Triple lumen hemodialysis catheter was inserted into right internal jugular vein using the Seldinger technique.  Evaluation Blood flow good Complications: No apparent complications Patient did tolerate procedure well. Chest X-ray ordered to verify placement.  CXR: pending.   Clementeen Graham 08/18/2018 Erick Colace ACNP-BC [place] Pager # [phone] OR # [phone] if no answer

## 2018-08-19 ENCOUNTER — Inpatient Hospital Stay (HOSPITAL_COMMUNITY): Payer: BLUE CROSS/BLUE SHIELD

## 2018-08-19 DIAGNOSIS — N170 Acute kidney failure with tubular necrosis: Secondary | ICD-10-CM

## 2018-08-19 LAB — POCT I-STAT EG7
Acid-base deficit: 1 mmol/L (ref 0.0–2.0)
Acid-base deficit: 1 mmol/L (ref 0.0–2.0)
Acid-base deficit: 1 mmol/L (ref 0.0–2.0)
Acid-base deficit: 1 mmol/L (ref 0.0–2.0)
Bicarbonate: 26.4 mmol/L (ref 20.0–28.0)
Bicarbonate: 26.5 mmol/L (ref 20.0–28.0)
Bicarbonate: 26.5 mmol/L (ref 20.0–28.0)
Bicarbonate: 26.6 mmol/L (ref 20.0–28.0)
Bicarbonate: 27.2 mmol/L (ref 20.0–28.0)
Calcium, Ion: 0.41 mmol/L — CL (ref 1.15–1.40)
Calcium, Ion: 0.43 mmol/L — CL (ref 1.15–1.40)
Calcium, Ion: 0.45 mmol/L — CL (ref 1.15–1.40)
Calcium, Ion: 0.45 mmol/L — CL (ref 1.15–1.40)
Calcium, Ion: 0.46 mmol/L — CL (ref 1.15–1.40)
HCT: 21 % — ABNORMAL LOW (ref 39.0–52.0)
HCT: 22 % — ABNORMAL LOW (ref 39.0–52.0)
HCT: 22 % — ABNORMAL LOW (ref 39.0–52.0)
HCT: 22 % — ABNORMAL LOW (ref 39.0–52.0)
HCT: 25 % — ABNORMAL LOW (ref 39.0–52.0)
Hemoglobin: 7.1 g/dL — ABNORMAL LOW (ref 13.0–17.0)
Hemoglobin: 7.5 g/dL — ABNORMAL LOW (ref 13.0–17.0)
Hemoglobin: 7.5 g/dL — ABNORMAL LOW (ref 13.0–17.0)
Hemoglobin: 7.5 g/dL — ABNORMAL LOW (ref 13.0–17.0)
Hemoglobin: 8.5 g/dL — ABNORMAL LOW (ref 13.0–17.0)
O2 Saturation: 44 %
O2 Saturation: 52 %
O2 Saturation: 60 %
O2 Saturation: 64 %
O2 Saturation: 74 %
Patient temperature: 100.5
Patient temperature: 99.1
Patient temperature: 99.1
Patient temperature: 99.1
Patient temperature: 99.3
Potassium: 4.2 mmol/L (ref 3.5–5.1)
Potassium: 4.2 mmol/L (ref 3.5–5.1)
Potassium: 4.4 mmol/L (ref 3.5–5.1)
Potassium: 4.5 mmol/L (ref 3.5–5.1)
Potassium: 4.5 mmol/L (ref 3.5–5.1)
Sodium: 138 mmol/L (ref 135–145)
Sodium: 138 mmol/L (ref 135–145)
Sodium: 139 mmol/L (ref 135–145)
Sodium: 139 mmol/L (ref 135–145)
Sodium: 140 mmol/L (ref 135–145)
TCO2: 28 mmol/L (ref 22–32)
TCO2: 28 mmol/L (ref 22–32)
TCO2: 28 mmol/L (ref 22–32)
TCO2: 28 mmol/L (ref 22–32)
TCO2: 29 mmol/L (ref 22–32)
pCO2, Ven: 58.9 mmHg (ref 44.0–60.0)
pCO2, Ven: 61.5 mmHg — ABNORMAL HIGH (ref 44.0–60.0)
pCO2, Ven: 62.1 mmHg — ABNORMAL HIGH (ref 44.0–60.0)
pCO2, Ven: 62.9 mmHg — ABNORMAL HIGH (ref 44.0–60.0)
pCO2, Ven: 63.5 mmHg — ABNORMAL HIGH (ref 44.0–60.0)
pH, Ven: 7.23 — ABNORMAL LOW (ref 7.250–7.430)
pH, Ven: 7.234 — ABNORMAL LOW (ref 7.250–7.430)
pH, Ven: 7.247 — ABNORMAL LOW (ref 7.250–7.430)
pH, Ven: 7.252 (ref 7.250–7.430)
pH, Ven: 7.264 (ref 7.250–7.430)
pO2, Ven: 29 mmHg — CL (ref 32.0–45.0)
pO2, Ven: 34 mmHg (ref 32.0–45.0)
pO2, Ven: 39 mmHg (ref 32.0–45.0)
pO2, Ven: 41 mmHg (ref 32.0–45.0)
pO2, Ven: 48 mmHg — ABNORMAL HIGH (ref 32.0–45.0)

## 2018-08-19 LAB — RENAL FUNCTION PANEL
Albumin: 2.1 g/dL — ABNORMAL LOW (ref 3.5–5.0)
Albumin: 1.9 g/dL — ABNORMAL LOW (ref 3.5–5.0)
Anion gap: 13 (ref 5–15)
Anion gap: 13 (ref 5–15)
BUN: 57 mg/dL — ABNORMAL HIGH (ref 6–20)
BUN: 54 mg/dL — ABNORMAL HIGH (ref 6–20)
CO2: 27 mmol/L (ref 22–32)
CO2: 28 mmol/L (ref 22–32)
Calcium: 8 mg/dL — ABNORMAL LOW (ref 8.9–10.3)
Calcium: 7.7 mg/dL — ABNORMAL LOW (ref 8.9–10.3)
Chloride: 97 mmol/L — ABNORMAL LOW (ref 98–111)
Chloride: 96 mmol/L — ABNORMAL LOW (ref 98–111)
Creatinine, Ser: 2.96 mg/dL — ABNORMAL HIGH (ref 0.61–1.24)
Creatinine, Ser: 2.78 mg/dL — ABNORMAL HIGH (ref 0.61–1.24)
GFR calc Af Amer: 27 mL/min — ABNORMAL LOW (ref >60)
GFR calc Af Amer: 29 mL/min — ABNORMAL LOW (ref >60)
GFR calc non Af Amer: 23 mL/min — ABNORMAL LOW (ref >60)
GFR calc non Af Amer: 25 mL/min — ABNORMAL LOW (ref >60)
Glucose, Bld: 231 mg/dL — ABNORMAL HIGH (ref 70–99)
Glucose, Bld: 203 mg/dL — ABNORMAL HIGH (ref 70–99)
Phosphorus: 2.4 mg/dL — ABNORMAL LOW (ref 2.5–4.6)
Phosphorus: 1.4 mg/dL — ABNORMAL LOW (ref 2.5–4.6)
Potassium: 4.3 mmol/L (ref 3.5–5.1)
Potassium: 4 mmol/L (ref 3.5–5.1)
Sodium: 137 mmol/L (ref 135–145)
Sodium: 137 mmol/L (ref 135–145)

## 2018-08-19 LAB — POCT I-STAT 7, (LYTES, BLD GAS, ICA,H+H)
Acid-Base Excess: 3 mmol/L — ABNORMAL HIGH (ref 0.0–2.0)
Acid-Base Excess: 1 mmol/L (ref 0.0–2.0)
Acid-Base Excess: 2 mmol/L (ref 0.0–2.0)
Acid-Base Excess: 1 mmol/L (ref 0.0–2.0)
Acid-Base Excess: 3 mmol/L — ABNORMAL HIGH (ref 0.0–2.0)
Acid-Base Excess: 4 mmol/L — ABNORMAL HIGH (ref 0.0–2.0)
Acid-Base Excess: 2 mmol/L (ref 0.0–2.0)
Acid-base deficit: 1 mmol/L (ref 0.0–2.0)
Acid-base deficit: 1 mmol/L (ref 0.0–2.0)
Acid-base deficit: 1 mmol/L (ref 0.0–2.0)
Acid-base deficit: 1 mmol/L (ref 0.0–2.0)
Acid-base deficit: 4 mmol/L — ABNORMAL HIGH (ref 0.0–2.0)
Bicarbonate: 26.7 mmol/L (ref 20.0–28.0)
Bicarbonate: 29.5 mmol/L — ABNORMAL HIGH (ref 20.0–28.0)
Bicarbonate: 26.1 mmol/L (ref 20.0–28.0)
Bicarbonate: 26.3 mmol/L (ref 20.0–28.0)
Bicarbonate: 26.3 mmol/L (ref 20.0–28.0)
Bicarbonate: 27.7 mmol/L (ref 20.0–28.0)
Bicarbonate: 26.2 mmol/L (ref 20.0–28.0)
Bicarbonate: 27.5 mmol/L (ref 20.0–28.0)
Bicarbonate: 27.7 mmol/L (ref 20.0–28.0)
Bicarbonate: 29.2 mmol/L — ABNORMAL HIGH (ref 20.0–28.0)
Bicarbonate: 29.9 mmol/L — ABNORMAL HIGH (ref 20.0–28.0)
Bicarbonate: 21.9 mmol/L (ref 20.0–28.0)
Bicarbonate: 27.7 mmol/L (ref 20.0–28.0)
Calcium, Ion: 0.53 mmol/L — CL (ref 1.15–1.40)
Calcium, Ion: 1.06 mmol/L — ABNORMAL LOW (ref 1.15–1.40)
Calcium, Ion: 0.51 mmol/L — CL (ref 1.15–1.40)
Calcium, Ion: 1 mmol/L — ABNORMAL LOW (ref 1.15–1.40)
Calcium, Ion: 0.49 mmol/L — CL (ref 1.15–1.40)
Calcium, Ion: 1.02 mmol/L — ABNORMAL LOW (ref 1.15–1.40)
Calcium, Ion: 0.49 mmol/L — CL (ref 1.15–1.40)
Calcium, Ion: 1.01 mmol/L — ABNORMAL LOW (ref 1.15–1.40)
Calcium, Ion: 1.03 mmol/L — ABNORMAL LOW (ref 1.15–1.40)
Calcium, Ion: 1.01 mmol/L — ABNORMAL LOW (ref 1.15–1.40)
Calcium, Ion: 1 mmol/L — ABNORMAL LOW (ref 1.15–1.40)
Calcium, Ion: 0.89 mmol/L — CL (ref 1.15–1.40)
Calcium, Ion: 1.03 mmol/L — ABNORMAL LOW (ref 1.15–1.40)
HCT: 18 % — ABNORMAL LOW (ref 39.0–52.0)
HCT: 22 % — ABNORMAL LOW (ref 39.0–52.0)
HCT: 18 % — ABNORMAL LOW (ref 39.0–52.0)
HCT: 23 % — ABNORMAL LOW (ref 39.0–52.0)
HCT: 18 % — ABNORMAL LOW (ref 39.0–52.0)
HCT: 22 % — ABNORMAL LOW (ref 39.0–52.0)
HCT: 16 % — ABNORMAL LOW (ref 39.0–52.0)
HCT: 22 % — ABNORMAL LOW (ref 39.0–52.0)
HCT: 18 % — ABNORMAL LOW (ref 39.0–52.0)
HCT: 15 % — ABNORMAL LOW (ref 39.0–52.0)
HCT: 19 % — ABNORMAL LOW (ref 39.0–52.0)
HCT: 19 % — ABNORMAL LOW (ref 39.0–52.0)
HCT: 21 % — ABNORMAL LOW (ref 39.0–52.0)
Hemoglobin: 6.1 g/dL — CL (ref 13.0–17.0)
Hemoglobin: 7.5 g/dL — ABNORMAL LOW (ref 13.0–17.0)
Hemoglobin: 6.1 g/dL — CL (ref 13.0–17.0)
Hemoglobin: 7.8 g/dL — ABNORMAL LOW (ref 13.0–17.0)
Hemoglobin: 6.1 g/dL — CL (ref 13.0–17.0)
Hemoglobin: 7.5 g/dL — ABNORMAL LOW (ref 13.0–17.0)
Hemoglobin: 5.4 g/dL — CL (ref 13.0–17.0)
Hemoglobin: 7.5 g/dL — ABNORMAL LOW (ref 13.0–17.0)
Hemoglobin: 6.1 g/dL — CL (ref 13.0–17.0)
Hemoglobin: 5.1 g/dL — CL (ref 13.0–17.0)
Hemoglobin: 6.5 g/dL — CL (ref 13.0–17.0)
Hemoglobin: 6.5 g/dL — CL (ref 13.0–17.0)
Hemoglobin: 7.1 g/dL — ABNORMAL LOW (ref 13.0–17.0)
O2 Saturation: 48 %
O2 Saturation: 92 %
O2 Saturation: 47 %
O2 Saturation: 86 %
O2 Saturation: 60 %
O2 Saturation: 86 %
O2 Saturation: 63 %
O2 Saturation: 89 %
O2 Saturation: 92 %
O2 Saturation: 98 %
O2 Saturation: 96 %
O2 Saturation: 91 %
O2 Saturation: 87 %
Patient temperature: 100.5
Patient temperature: 99.1
Patient temperature: 99.1
Patient temperature: 99.3
Patient temperature: 99.1
Potassium: 4.6 mmol/L (ref 3.5–5.1)
Potassium: 4.6 mmol/L (ref 3.5–5.1)
Potassium: 4.3 mmol/L (ref 3.5–5.1)
Potassium: 4.5 mmol/L (ref 3.5–5.1)
Potassium: 4.5 mmol/L (ref 3.5–5.1)
Potassium: 4.6 mmol/L (ref 3.5–5.1)
Potassium: 4.6 mmol/L (ref 3.5–5.1)
Potassium: 4.7 mmol/L (ref 3.5–5.1)
Potassium: 4.6 mmol/L (ref 3.5–5.1)
Potassium: 4.6 mmol/L (ref 3.5–5.1)
Potassium: 4.9 mmol/L (ref 3.5–5.1)
Potassium: 3.4 mmol/L — ABNORMAL LOW (ref 3.5–5.1)
Potassium: 4.3 mmol/L (ref 3.5–5.1)
Sodium: 138 mmol/L (ref 135–145)
Sodium: 138 mmol/L (ref 135–145)
Sodium: 138 mmol/L (ref 135–145)
Sodium: 139 mmol/L (ref 135–145)
Sodium: 138 mmol/L (ref 135–145)
Sodium: 138 mmol/L (ref 135–145)
Sodium: 137 mmol/L (ref 135–145)
Sodium: 138 mmol/L (ref 135–145)
Sodium: 138 mmol/L (ref 135–145)
Sodium: 137 mmol/L (ref 135–145)
Sodium: 137 mmol/L (ref 135–145)
Sodium: 143 mmol/L (ref 135–145)
Sodium: 137 mmol/L (ref 135–145)
TCO2: 28 mmol/L (ref 22–32)
TCO2: 31 mmol/L (ref 22–32)
TCO2: 28 mmol/L (ref 22–32)
TCO2: 28 mmol/L (ref 22–32)
TCO2: 28 mmol/L (ref 22–32)
TCO2: 29 mmol/L (ref 22–32)
TCO2: 28 mmol/L (ref 22–32)
TCO2: 29 mmol/L (ref 22–32)
TCO2: 29 mmol/L (ref 22–32)
TCO2: 31 mmol/L (ref 22–32)
TCO2: 32 mmol/L (ref 22–32)
TCO2: 23 mmol/L (ref 22–32)
TCO2: 29 mmol/L (ref 22–32)
pCO2 arterial: 55.6 mmHg — ABNORMAL HIGH (ref 32.0–48.0)
pCO2 arterial: 60.4 mmHg — ABNORMAL HIGH (ref 32.0–48.0)
pCO2 arterial: 48.1 mmHg — ABNORMAL HIGH (ref 32.0–48.0)
pCO2 arterial: 59.2 mmHg — ABNORMAL HIGH (ref 32.0–48.0)
pCO2 arterial: 55.3 mmHg — ABNORMAL HIGH (ref 32.0–48.0)
pCO2 arterial: 57 mmHg — ABNORMAL HIGH (ref 32.0–48.0)
pCO2 arterial: 50.7 mmHg — ABNORMAL HIGH (ref 32.0–48.0)
pCO2 arterial: 59.6 mmHg — ABNORMAL HIGH (ref 32.0–48.0)
pCO2 arterial: 57.3 mmHg — ABNORMAL HIGH (ref 32.0–48.0)
pCO2 arterial: 58.3 mmHg — ABNORMAL HIGH (ref 32.0–48.0)
pCO2 arterial: 57.6 mmHg — ABNORMAL HIGH (ref 32.0–48.0)
pCO2 arterial: 42.5 mmHg (ref 32.0–48.0)
pCO2 arterial: 50.9 mmHg — ABNORMAL HIGH (ref 32.0–48.0)
pH, Arterial: 7.254 — ABNORMAL LOW (ref 7.350–7.450)
pH, Arterial: 7.333 — ABNORMAL LOW (ref 7.350–7.450)
pH, Arterial: 7.255 — ABNORMAL LOW (ref 7.350–7.450)
pH, Arterial: 7.343 — ABNORMAL LOW (ref 7.350–7.450)
pH, Arterial: 7.271 — ABNORMAL LOW (ref 7.350–7.450)
pH, Arterial: 7.308 — ABNORMAL LOW (ref 7.350–7.450)
pH, Arterial: 7.251 — ABNORMAL LOW (ref 7.350–7.450)
pH, Arterial: 7.343 — ABNORMAL LOW (ref 7.350–7.450)
pH, Arterial: 7.297 — ABNORMAL LOW (ref 7.350–7.450)
pH, Arterial: 7.31 — ABNORMAL LOW (ref 7.350–7.450)
pH, Arterial: 7.324 — ABNORMAL LOW (ref 7.350–7.450)
pH, Arterial: 7.321 — ABNORMAL LOW (ref 7.350–7.450)
pH, Arterial: 7.345 — ABNORMAL LOW (ref 7.350–7.450)
pO2, Arterial: 31 mmHg — CL (ref 83.0–108.0)
pO2, Arterial: 68 mmHg — ABNORMAL LOW (ref 83.0–108.0)
pO2, Arterial: 30 mmHg — CL (ref 83.0–108.0)
pO2, Arterial: 55 mmHg — ABNORMAL LOW (ref 83.0–108.0)
pO2, Arterial: 36 mmHg — CL (ref 83.0–108.0)
pO2, Arterial: 58 mmHg — ABNORMAL LOW (ref 83.0–108.0)
pO2, Arterial: 39 mmHg — CL (ref 83.0–108.0)
pO2, Arterial: 60 mmHg — ABNORMAL LOW (ref 83.0–108.0)
pO2, Arterial: 76 mmHg — ABNORMAL LOW (ref 83.0–108.0)
pO2, Arterial: 127 mmHg — ABNORMAL HIGH (ref 83.0–108.0)
pO2, Arterial: 93 mmHg (ref 83.0–108.0)
pO2, Arterial: 69 mmHg — ABNORMAL LOW (ref 83.0–108.0)
pO2, Arterial: 58 mmHg — ABNORMAL LOW (ref 83.0–108.0)

## 2018-08-19 LAB — CBC
HCT: 24.3 % — ABNORMAL LOW (ref 39.0–52.0)
Hemoglobin: 7.6 g/dL — ABNORMAL LOW (ref 13.0–17.0)
MCH: 29.5 pg (ref 26.0–34.0)
MCHC: 31.3 g/dL (ref 30.0–36.0)
MCV: 94.2 fL (ref 80.0–100.0)
Platelets: 111 10*3/uL — ABNORMAL LOW (ref 150–400)
RBC: 2.58 MIL/uL — ABNORMAL LOW (ref 4.22–5.81)
RDW: 18.8 % — ABNORMAL HIGH (ref 11.5–15.5)
WBC: 0.5 10*3/uL — CL (ref 4.0–10.5)
nRBC: 0 % (ref 0.0–0.2)

## 2018-08-19 LAB — APTT: aPTT: 30 seconds (ref 24–36)

## 2018-08-19 LAB — MAGNESIUM: Magnesium: 2.2 mg/dL (ref 1.7–2.4)

## 2018-08-19 LAB — GLUCOSE, CAPILLARY
Glucose-Capillary: 181 mg/dL — ABNORMAL HIGH (ref 70–99)
Glucose-Capillary: 69 mg/dL — ABNORMAL LOW (ref 70–99)
Glucose-Capillary: 207 mg/dL — ABNORMAL HIGH (ref 70–99)
Glucose-Capillary: 278 mg/dL — ABNORMAL HIGH (ref 70–99)
Glucose-Capillary: 228 mg/dL — ABNORMAL HIGH (ref 70–99)
Glucose-Capillary: 195 mg/dL — ABNORMAL HIGH (ref 70–99)

## 2018-08-19 LAB — POCT ACTIVATED CLOTTING TIME: Activated Clotting Time: 153 seconds

## 2018-08-19 LAB — PREPARE RBC (CROSSMATCH)

## 2018-08-19 LAB — CALCIUM, IONIZED: Calcium, Ionized, Serum: 4.2 mg/dL — ABNORMAL LOW (ref 4.5–5.6)

## 2018-08-19 MED ORDER — SODIUM CHLORIDE 0.9% IV SOLUTION: Freq: Once | INTRAVENOUS | Status: DC

## 2018-08-19 NOTE — Progress Notes (Signed)
eLink Physician-Brief Progress Note Patient Name: [name] Long DOB: Mar 11, 1966 MRN: [id number]   Date of Service  08/19/2018  HPI/Events of Note  Notified of Hgb 6.6. Patient ongoing CRRT with frequent clotting. No overt sign of active bleeding.  eICU Interventions  Patient will benefit with blood transfusion. 1 unit PRBC ordered     Intervention Category Intermediate Interventions: Bleeding - evaluation and treatment with blood products  Judd Lien 08/19/2018, 12:19 AM

## 2018-08-19 NOTE — Progress Notes (Signed)
Patient ID: [name] Long, male   DOB: 1965/09/14, 53 y.o.   MRN: [id number] S: No events overnight.  Weaning pressors and able to come down on FiO2 and PEEP O:BP 132/60   Pulse (!) 111   Temp 99.9 F (37.7 C) (Axillary)   Resp 13   Ht '5\' 10"'  (1.778 m)   Wt 134 kg   SpO2 92%   BMI 42.39 kg/m   Intake/Output Summary (Last 24 hours) at 08/19/2018 1307 Last data filed at 08/19/2018 1200 Gross per 24 hour  Intake 6587.08 ml  Output 6646 ml  Net -58.92 ml   Intake/Output: I/O last 3 completed shifts: In: 10775.9 [I.V.:6842; Blood:365; Other:510; NG/GT:2708.9; IV Piggyback:350] Out: 9024 [Other:5925; Stool:580]  Intake/Output this shift:  Total I/O In: 867 [I.V.:542; NG/GT:325] Out: 1020 [Other:1020] Weight change: 0.6 kg Gen: intubated and sedated Direct physical contact was not performed due to concerns over covid-19 infection.  The exam performed by the PCCM physician was used in determining care.   Recent Labs  Lab 08/15/18 1600  08/16/18 0428  08/17/18 0430  08/17/18 1620  08/18/18 0323  08/18/18 1553  08/19/18 0057 08/19/18 0102 08/19/18 0317 08/19/18 0324 08/19/18 0502 08/19/18 0507 08/19/18 0559  NA 137   < > 134*   < > 136   < > 137   < > 136  137   < > 137   < > 139 137 139 143 137 140 137  K 3.8   < > 3.6   < > 4.0   < > 4.5   < > 5.0  5.0   < > 4.8   < > 4.4 4.9 4.2 3.4* 4.3 4.2 4.3  CL 94*  --  94*  --  100  --  95*  --  96*  97*  --  98  --   --   --   --   --   --   --  97*  CO2 29  --  28  --  25  --  28  --  28  28  --  26  --   --   --   --   --   --   --  27  GLUCOSE 209*  --  177*  --  245*  --  269*  --  204*  207*  --  241*  --   --   --   --   --   --   --  231*  BUN 43*  --  46*  --  37*  --  42*  --  55*  55*  --  61*  --   --   --   --   --   --   --  57*  CREATININE 3.05*  --  3.13*  --  2.68*  --  2.93*  --  3.52*  3.53*  --  3.52*  --   --   --   --   --   --   --  2.96*  ALBUMIN 2.1*  --  2.4*  --  2.0*  2.0*  --  2.0*  --  1.9*  --   1.8*  --   --   --   --   --   --   --  2.1*  CALCIUM 7.8*  --  7.6*  --  6.9*  --  7.7*  --  7.6*  7.6*  --  7.7*  --   --   --   --   --   --   --  8.0*  PHOS 2.1*  --  2.0*  --  3.7  --  3.2  --  5.1*  --  3.3  --   --   --   --   --   --   --  2.4*  AST  --   --   --   --  15  --   --   --   --   --   --   --   --   --   --   --   --   --   --   ALT  --   --   --   --  25  --   --   --   --   --   --   --   --   --   --   --   --   --   --    < > = values in this interval not displayed.   Liver Function Tests: Recent Labs  Lab 08/17/18 0430  08/18/18 0323 08/18/18 1553 08/19/18 0559  AST 15  --   --   --   --   ALT 25  --   --   --   --   ALKPHOS 59  --   --   --   --   BILITOT 1.3*  --   --   --   --   PROT 5.5*  --   --   --   --   ALBUMIN 2.0*  2.0*   < > 1.9* 1.8* 2.1*   < > = values in this interval not displayed.   No results for input(s): LIPASE, AMYLASE in the last 168 hours. No results for input(s): AMMONIA in the last 168 hours. CBC: Recent Labs  Lab 08/13/18 0420  08/17/18 0430  08/17/18 1620  08/18/18 0323  08/18/18 2143  08/19/18 0502 08/19/18 0507 08/19/18 0559  WBC 2.9*   < > 0.8*  --  0.8*  --  1.0*  --  0.6*  --   --   --  0.5*  NEUTROABS 1.8  --   --   --   --   --   --   --   --   --   --   --   --   HGB 8.5*   < > 7.0*   < > 6.8*   < > 7.0*   < > 6.6*   < > 7.1* 8.5* 7.6*  HCT 26.4*   < > 23.7*   < > 22.5*   < > 23.2*   < > 21.6*   < > 21.0* 25.0* 24.3*  MCV 92.0   < > 96.7  --  95.7  --  94.7  --  96.4  --   --   --  94.2  PLT 137*   < > 141*  --  159  --  165  --  129*  --   --   --  111*   < > = values in this interval not displayed.   Cardiac Enzymes: No results for input(s): CKTOTAL, CKMB, CKMBINDEX, TROPONINI in the last 168 hours. CBG: Recent Labs  Lab 08/18/18 1954 08/18/18 2256 08/19/18 0318 08/19/18 0319 08/19/18 0821  GLUCAP 192* 265* 69* 181* 207*    Iron Studies: No results for input(s): IRON, TIBC, TRANSFERRIN, FERRITIN  in the last 72 hours. Studies/Results: Dg Chest Port 1 [address]  Result Date: 08/19/2018 CLINICAL DATA:  53 year old male with pneumonia, sepsis, positive COVID-19. EXAM: PORTABLE CHEST 1 VIEW COMPARISON:  08/18/2018 and earlier. FINDINGS: Portable AP semi upright view at 0435 hours. Stable endotracheal tube tip at the level the clavicles. Enteric tube courses to the abdomen, tip not included. Stable right IJ central line. The patient is more rotated to the right. Stable cardiomegaly and mediastinal contours. Stable somewhat low lung volumes. Patchy in coarse bilateral pulmonary opacity has not significantly changed since 08/17/2018. No pneumothorax or pleural effusion. Paucity of bowel gas in the upper abdomen. IMPRESSION: 1. Stable lines and tubes. 2. Patchy bilateral pulmonary opacity due to COVID-19 pneumonia not significantly changed since 08/17/18. Electronically Signed   By: Genevie Ann M.D.   On: 08/19/2018 06:57   Dg Chest Port 1 View  Result Date: 08/18/2018 CLINICAL DATA:  Central line placement EXAM: PORTABLE CHEST 1 VIEW COMPARISON:  08/18/2018 FINDINGS: Endotracheal tube with the tip 4.4 cm above the carina. Nasogastric tube coursing below the diaphragm. Right jugular central venous catheter with the tip projecting over the SVC. Bilateral interstitial and alveolar airspace opacities diffusely throughout the lungs. No pleural effusion or pneumothorax. Stable cardiomegaly. No acute osseous abnormality. IMPRESSION: 1. Support lines and tubing in satisfactory position. 2. Bilateral interstitial and alveolar airspace opacities which may reflect multilobar pneumonia versus pulmonary edema. Electronically Signed   By: Kathreen Devoid   On: 08/18/2018 10:06   Dg Chest Port 1 View  Result Date: 08/18/2018 CLINICAL DATA:  53 year old male with COVID-19. Respiratory failure. EXAM: PORTABLE CHEST 1 VIEW COMPARISON:  08/17/2018 and earlier. FINDINGS: Portable AP semi upright view at 0442 hours. Stable endotracheal  tube and enteric tube. Coarse interstitial and patchy bilateral pulmonary opacity appears mildly regressed since yesterday, in addition to larger lung volumes. Opacity remains increased compared to 08/16/2018. No pneumothorax or pleural effusion. Paucity bowel gas in the upper abdomen. IMPRESSION: 1.  Stable lines and tubes. 2. Mildly larger lung volumes and improved bilateral ventilation since yesterday with continued widespread coarse and patchy pulmonary opacity. Electronically Signed   By: Genevie Ann M.D.   On: 08/18/2018 06:59   Dg Abd Portable 1v  Result Date: 08/17/2018 CLINICAL DATA:  53 year old male with abdominal distension. EXAM: PORTABLE ABDOMEN - 1 VIEW COMPARISON:  Prior abdominal radiograph 08/10/2018 FINDINGS: Well-positioned gastric tube. The tip of the tube overlies the gastric antrum in unchanged position. No significant gaseous distension or evidence of obstruction. No acute osseous abnormality. Partially imaged right femoral non tunneled hemodialysis catheter. IMPRESSION: 1. Stable position of gastric tube with the tip overlying the gastric antrum. 2. No evidence of bowel obstruction. Electronically Signed   By: Jacqulynn Cadet M.D.   On: 08/17/2018 13:42   . sodium chloride   Intravenous Once  . sodium chloride   Intravenous Once  . chlorhexidine gluconate (MEDLINE KIT)  15 mL Mouth Rinse BID  . Chlorhexidine Gluconate Cloth  6 each Topical Daily  . feeding supplement (PRO-STAT SUGAR FREE 64)  60 mL Per Tube BID  . heparin injection (subcutaneous)  5,000 Units Subcutaneous Q12H  . insulin aspart  0-9 Units Subcutaneous Q4H  . mouth rinse  15 mL Mouth Rinse 10 times per day  . pantoprazole (PROTONIX) IV  40 mg Intravenous Q24H  . sodium chloride flush  10-40 mL Intracatheter Q12H    BMET    Component Value Date/Time   NA 137 08/19/2018 0559   K 4.3 08/19/2018 0559   CL 97 (L) 08/19/2018 0559  CO2 27 08/19/2018 0559   GLUCOSE 231 (H) 08/19/2018 0559   BUN 57 (H)  08/19/2018 0559   CREATININE 2.96 (H) 08/19/2018 0559   CALCIUM 8.0 (L) 08/19/2018 0559   GFRNONAA 23 (L) 08/19/2018 0559   GFRAA 27 (L) 08/19/2018 0559   CBC    Component Value Date/Time   WBC 0.5 (LL) 08/19/2018 0559   RBC 2.58 (L) 08/19/2018 0559   HGB 7.6 (L) 08/19/2018 0559   HCT 24.3 (L) 08/19/2018 0559   PLT 111 (L) 08/19/2018 0559   MCV 94.2 08/19/2018 0559   MCH 29.5 08/19/2018 0559   MCHC 31.3 08/19/2018 0559   RDW 18.8 (H) 08/19/2018 0559   LYMPHSABS 0.6 (L) 08/13/2018 0420   MONOABS 0.4 08/13/2018 0420   EOSABS 0.1 08/13/2018 0420   BASOSABS 0.0 08/13/2018 0420    Assessment/Plan:  1. ARF in setting of coronavirus sepsis. Presumably due to ischemic ATN although unclear if there is direct viral injury with covid-19. He has been receiving CVVHD since 08/02/18 in setting of worsening respiratory failure and metabolic acidosis.  1. HD catheter was changed to right fem site on 08/09/18 and again on 08/18/18 due to poor function and frequent clotting. 2. Tolerating UF with CVVHD with some improvement of FiO2 and PEEP over the last 24 hours. 3. Wean vasopressin as able 2. Coronavirus PNA with sepsis and VDRF- intubated since 08/02/18.  3. ARDS- concern for possible HCAP on top of covid-19. Now on zosyn and vancomycin per PCCM. Will likely require prone ventilation again. 4. Septic shock- vasopressin added due to Afib with RVR related to levophed. Per PCCM added HCAP coverage started 4/19 5. ABLA- off of heparin.  6. Hypophosphatemia- replete as needed 7. Disposition- poor overall prognosis. 8. Perforated diverticulum  Donetta Potts, MD [facility] [phone]

## 2018-08-19 NOTE — Progress Notes (Addendum)
NAME:  [name] Long, MRN:  [id number], DOB:  Feb 22, 1966, LOS: 21 ADMISSION DATE:  08/25/2018, CONSULTATION DATE:  07/30/2018 REFERRING MD:  07/30/2018, CHIEF COMPLAINT:   Brief History   53 yo male presented to [facility] with several days of abdominal pain from diverticulitis with retained perforation.  Developed pneumonia with hypoxia from [place].  Transferred to [facility]. Actemra not given due to diverticulitis  Past Medical History  DM2, HTN, Former smoker, OSA, A fib  [place] [facility] Events   4/01 admission for diverticulitis 4/02 PCCM consulted for worsening dyspnea, hypoxemia 4/02 Proned  4/03 Proned  4/05 Improved oxygenation however ABG with respiratory and metabolic acidosis. Poor UOP 4/06 Supine; completed plaquenil 4/07 CVVHD, difficulty with filters clotting  4/08 CVVHD ongoing, AFwRVR afternoon of 4/8, started on amio gtt   4/12 hemodialysis catheter changed to right femoral location; brief episode of PEA resolved sponateously 08/11/2018 hemodialysis catheter and filter for CRRT machine or clotting off. 08/11/2018 started on heparin drip for atrial fibrillation in filter clotting on CRRT machine 4/18- Was off sedation transiently 4/17, then had to be started on Precedex. He has tolerated pressure support 8 very well, used PRVC auto mode for the rest of the day yesterday 4/17 Had some agitation requiring increased fentanyl, sedation overnight.  Resultant hypotension and low-dose norepinephrine started.  Now weaned to off.  4/19: had episode of bradycardia, hypotension, hypoxia. Etiology unclear. Started back on pressors and deep sedation.  4/20: X-ray looks worse.  Added HCAP coverage on 4/19, still requiring high PEEP and FiO2 4/21:peep and fio2 down, remains heavily sedated, still on pressors.  Placed new dialysis catheter as femoral had been clotting off 4/22: Weaning off pressors.  Again we will taper back sedation once again.  Still unresponsive from aggressive sedation, did get 1  unit of blood  Consults:  Renal GI signed off Surgery signed off Procedures:  ETT 4/2 >>  L IJ HD 4/6 >> 4/12 R Rad Aline 4/6 >>  4/12 right femoral HD catheter 24 cm>> Right IJ triple-lumen dialysis catheter 4/21>>> Significant Diagnostic Tests:  CT chest 4/01 [name]) >> b/l GGO in periphery CT abd/pelvis 4/01 [name]) >> perforated diverticulum lower descending colon, fatty liver with ?cirrhosis, splenomegaly 18.4 cm  Micro Data:  COVID 3/30 (PCP office) >> DETECTED Blood 4/12 >> neg COVID at [facility] 08/16/2018 (repeat for clearance) - POSITIVE Tracheal aspirate 4/20 >  Antimicrobials:  Zosyn 4/1 >> 4/7 Zoysn 4/12 >>4/17 Zosyn 4/19 > Vancomycin 4/19 >  Interim history/subjective:   Weaning pressors otherwise no significant changes Objective   Blood pressure 132/60, pulse (Abnormal) 115, temperature (Abnormal) 100.5 F (38.1 C), temperature source Axillary, resp. rate 13, height 5\' 10"  (1.778 m), weight 134 kg, SpO2 96 %. CVP:  [0 mmHg-15 mmHg] 15 mmHg  Vent Mode: PRVC FiO2 (%):  [40 %-60 %] 60 % Set Rate:  [20 bmp] 20 bmp Vt Set:  [510 mL] 510 mL PEEP:  [12 cmH20] 12 cmH20 Plateau Pressure:  [12 cmH20-25 cmH20] 12 cmH20   Intake/Output Summary (Last 24 hours) at 08/19/2018 0958 Last data filed at 08/19/2018 0900 Gross per 24 hour  Intake 7258.51 ml  Output 6442 ml  Net 816.51 ml   Filed Weights   08/17/18 0500 08/18/18 0359 08/19/18 0500  Weight: 128.3 kg 133.4 kg 134 kg   General: This is a 53 year old male patient who remains heavily sedated on full ventilatory support HEENT normocephalic atraumatic he does have viral ulcerations on his mouth.  He is orally intubated.  Pulmonary: Diminished throughout equal breath sounds peak pressures 21 Cardiac: Regular irregular atrial fibrillation with controlled ventricular rate on telemetry Abdomen: Soft, continuous liquid stools, positive bowel sounds tolerating tube feeds Extremities: Warm and dry dependent and  edema/anasarca Neuro: Heavily sedated GU: Concentrated minimal urine output  Resolved Hospital Problem list   Mixed Acidosis, ARDS from COVID-19 Septic shock resolved 4/22  Assessment & Plan:   Acute hypoxemic respiratory failure due to  COVID-19 infection (repeat test for clearance 4/19 remains positive) complicated by presumed ventilator associated pneumonia and ARDS -PCXR personally reviewed no significant change with bilateral airspace disease Plan ARDS protocol  VAP bundle  PAD protocol with RASS goal -1  Repeat x-ray a.m.  Day #4 nosocomial coverage with vancomycin and Zosyn   Septic shock presume repeat episode of shock 2/2 HCAP: Wean pressors to off Plan Okay for volume removal Discontinue vasopressin Continue telemetry   Acute metabolic encephalopathy Plan Changing RASS goal to -1 Titrate Dilaudid and Versed  Atrial fibrillation with RVR: seems to be in sinus now Plan Continue amiodarone Holding anticoagulation due to blood loss  Acute renal failure; now w/ HD access issues as of 4/21 Plan CRRT per nephrology , I do not think he is a candidate for prolonged therapy we need to discuss goals of care with spouse  Anemia critical illness now maybe a component of acute blood loss as well. Some bloody plug in ET suction and melenic stools. Occult blood testing positive.  -hgb transfused yesterday  Plan Continue to trend CBC Transfuse for hemoglobin less than 7  DM Plan Continue sliding scale insulin we will adjust basal dosing  MSK/DERM Plan Continue current wound care per wound ostomy nurse    Best practice:  Diet: NPO, tube feedings trickle DVT prophylaxis: SCDs GI prophylaxis: pantoprazole Mobility: bed rest Code Status: No CPR or defibrillation but full medical care Family Communication: I updated the wife via telephone today on 4/21 Disposition: Remains critically ill.  Once again we are working towards weaning sedation, discontinuing  vasopressin, and readying to initiate weaning process hopefully in the next 24 to 48 hours.  I am very concerned about his overall prognosis, I doubt he will recover from this.  We will continue current level of care, he is a DO NOT RESUSCITATE, I think if he deteriorates once again we should transition to comfort.  Will update wife later today    Erick Colace ACNP-BC [place] Pager # [phone] OR # [phone] if no answer  08/19/2018 9:58 AM    Attending Note:  I have examined patient, reviewed labs, studies and notes.   Critically ill 53 year old man with a perforation due to diverticulitis, contained peritoneal fluid collection.  Found to be COVID-19 positive.  Course complicated by progressive pulmonary infiltrates and ARDS requiring mechanical ventilation, septic shock, acute renal failure with CVVHD, atrial fibrillation with RVR.  He is in shock on pressors, on higher sedation (Dilaudid and Versed) with FiO2 0.60, PEEP 12.  He CVVHD had been clotting and a new HD catheter was placed 4/21.  Zosyn had been completed on 4/17, antibiotics restarted on 4/19.  Vitals:   08/19/18 0815 08/19/18 0900 08/19/18 1000 08/19/18 1100  BP:      Pulse:  (!) 114 (!) 109 (!) 112  Resp:  11 11 13   Temp:   99.9 F (37.7 C)   TempSrc:   Axillary   SpO2: 96% 97% 94% 96%  Weight:      Height:  Exam was from the doorway to preserve PPE.  Please refer to P Babcock's detailed exam.  Severely ill with multiorgan failure due to perforation with peritonitis and also COVID-19. He had shown some relative improvement (still on a lot of support) as of the last time I seen him on 4/18.  Since then he has evolved shock, worsening gas exchange, has required more sedation.  His second course of Zosyn for his intra-abdominal fluid collection ended on 4/17, question whether his decompensation was related to stopping his antibiotics.  We will continue his Zosyn, vancomycin as ordered.  Try to  wean sedation as able.  His gas exchange will be a limiting factor here.  Wean pressors.  Given his recent setback I believe it has become less likely that he can survive this illness even if he were to undergo tracheostomy and more prolonged support.  Further he has been dialysis dependent and not clear to me that he is a good candidate for long-term dialysis.  We will review his status, prognosis with his family and will need to discuss overall goals for his care in light of his decline.   Independent critical care time is 35 minutes.   Baltazar Apo, MD, PhD 08/19/2018, 11:03 AM [facility] [phone] or if no answer [phone]

## 2018-08-20 LAB — POCT I-STAT 7, (LYTES, BLD GAS, ICA,H+H)
Acid-Base Excess: 3 mmol/L — ABNORMAL HIGH (ref 0.0–2.0)
Acid-Base Excess: 3 mmol/L — ABNORMAL HIGH (ref 0.0–2.0)
Acid-Base Excess: 4 mmol/L — ABNORMAL HIGH (ref 0.0–2.0)
Acid-Base Excess: 4 mmol/L — ABNORMAL HIGH (ref 0.0–2.0)
Acid-Base Excess: 8 mmol/L — ABNORMAL HIGH (ref 0.0–2.0)
Acid-Base Excess: 8 mmol/L — ABNORMAL HIGH (ref 0.0–2.0)
Acid-Base Excess: 6 mmol/L — ABNORMAL HIGH (ref 0.0–2.0)
Bicarbonate: 29.1 mmol/L — ABNORMAL HIGH (ref 20.0–28.0)
Bicarbonate: 28.7 mmol/L — ABNORMAL HIGH (ref 20.0–28.0)
Bicarbonate: 28.4 mmol/L — ABNORMAL HIGH (ref 20.0–28.0)
Bicarbonate: 29.5 mmol/L — ABNORMAL HIGH (ref 20.0–28.0)
Bicarbonate: 32.9 mmol/L — ABNORMAL HIGH (ref 20.0–28.0)
Bicarbonate: 32.9 mmol/L — ABNORMAL HIGH (ref 20.0–28.0)
Bicarbonate: 34.2 mmol/L — ABNORMAL HIGH (ref 20.0–28.0)
Calcium, Ion: 1.05 mmol/L — ABNORMAL LOW (ref 1.15–1.40)
Calcium, Ion: 1.04 mmol/L — ABNORMAL LOW (ref 1.15–1.40)
Calcium, Ion: 1.03 mmol/L — ABNORMAL LOW (ref 1.15–1.40)
Calcium, Ion: 1.1 mmol/L — ABNORMAL LOW (ref 1.15–1.40)
Calcium, Ion: 1.08 mmol/L — ABNORMAL LOW (ref 1.15–1.40)
Calcium, Ion: 1.06 mmol/L — ABNORMAL LOW (ref 1.15–1.40)
Calcium, Ion: 1.12 mmol/L — ABNORMAL LOW (ref 1.15–1.40)
HCT: 23 % — ABNORMAL LOW (ref 39.0–52.0)
HCT: 21 % — ABNORMAL LOW (ref 39.0–52.0)
HCT: 20 % — ABNORMAL LOW (ref 39.0–52.0)
HCT: 18 % — ABNORMAL LOW (ref 39.0–52.0)
HCT: 18 % — ABNORMAL LOW (ref 39.0–52.0)
HCT: 20 % — ABNORMAL LOW (ref 39.0–52.0)
HCT: 24 % — ABNORMAL LOW (ref 39.0–52.0)
Hemoglobin: 7.8 g/dL — ABNORMAL LOW (ref 13.0–17.0)
Hemoglobin: 7.1 g/dL — ABNORMAL LOW (ref 13.0–17.0)
Hemoglobin: 6.8 g/dL — CL (ref 13.0–17.0)
Hemoglobin: 6.1 g/dL — CL (ref 13.0–17.0)
Hemoglobin: 6.1 g/dL — CL (ref 13.0–17.0)
Hemoglobin: 6.8 g/dL — CL (ref 13.0–17.0)
Hemoglobin: 8.2 g/dL — ABNORMAL LOW (ref 13.0–17.0)
O2 Saturation: 91 %
O2 Saturation: 97 %
O2 Saturation: 96 %
O2 Saturation: 88 %
O2 Saturation: 94 %
O2 Saturation: 83 %
O2 Saturation: 99 %
Patient temperature: 99.6
Patient temperature: 99
Potassium: 4.3 mmol/L (ref 3.5–5.1)
Potassium: 4.4 mmol/L (ref 3.5–5.1)
Potassium: 4.2 mmol/L (ref 3.5–5.1)
Potassium: 4 mmol/L (ref 3.5–5.1)
Potassium: 4.1 mmol/L (ref 3.5–5.1)
Potassium: 4 mmol/L (ref 3.5–5.1)
Potassium: 4.2 mmol/L (ref 3.5–5.1)
Sodium: 138 mmol/L (ref 135–145)
Sodium: 137 mmol/L (ref 135–145)
Sodium: 137 mmol/L (ref 135–145)
Sodium: 136 mmol/L (ref 135–145)
Sodium: 137 mmol/L (ref 135–145)
Sodium: 137 mmol/L (ref 135–145)
Sodium: 138 mmol/L (ref 135–145)
TCO2: 31 mmol/L (ref 22–32)
TCO2: 30 mmol/L (ref 22–32)
TCO2: 30 mmol/L (ref 22–32)
TCO2: 31 mmol/L (ref 22–32)
TCO2: 34 mmol/L — ABNORMAL HIGH (ref 22–32)
TCO2: 34 mmol/L — ABNORMAL HIGH (ref 22–32)
TCO2: 37 mmol/L — ABNORMAL HIGH (ref 22–32)
pCO2 arterial: 55 mmHg — ABNORMAL HIGH (ref 32.0–48.0)
pCO2 arterial: 51.7 mmHg — ABNORMAL HIGH (ref 32.0–48.0)
pCO2 arterial: 42.6 mmHg (ref 32.0–48.0)
pCO2 arterial: 48.8 mmHg — ABNORMAL HIGH (ref 32.0–48.0)
pCO2 arterial: 46.8 mmHg (ref 32.0–48.0)
pCO2 arterial: 50.1 mmHg — ABNORMAL HIGH (ref 32.0–48.0)
pCO2 arterial: 78.3 mmHg (ref 32.0–48.0)
pH, Arterial: 7.334 — ABNORMAL LOW (ref 7.350–7.450)
pH, Arterial: 7.352 (ref 7.350–7.450)
pH, Arterial: 7.432 (ref 7.350–7.450)
pH, Arterial: 7.39 (ref 7.350–7.450)
pH, Arterial: 7.455 — ABNORMAL HIGH (ref 7.350–7.450)
pH, Arterial: 7.425 (ref 7.350–7.450)
pH, Arterial: 7.25 — ABNORMAL LOW (ref 7.350–7.450)
pO2, Arterial: 69 mmHg — ABNORMAL LOW (ref 83.0–108.0)
pO2, Arterial: 96 mmHg (ref 83.0–108.0)
pO2, Arterial: 79 mmHg — ABNORMAL LOW (ref 83.0–108.0)
pO2, Arterial: 56 mmHg — ABNORMAL LOW (ref 83.0–108.0)
pO2, Arterial: 70 mmHg — ABNORMAL LOW (ref 83.0–108.0)
pO2, Arterial: 47 mmHg — ABNORMAL LOW (ref 83.0–108.0)
pO2, Arterial: 152 mmHg — ABNORMAL HIGH (ref 83.0–108.0)

## 2018-08-20 LAB — POCT I-STAT EG7
Acid-Base Excess: 1 mmol/L (ref 0.0–2.0)
Acid-Base Excess: 1 mmol/L (ref 0.0–2.0)
Acid-Base Excess: 1 mmol/L (ref 0.0–2.0)
Acid-Base Excess: 1 mmol/L (ref 0.0–2.0)
Acid-Base Excess: 2 mmol/L (ref 0.0–2.0)
Acid-Base Excess: 2 mmol/L (ref 0.0–2.0)
Acid-Base Excess: 3 mmol/L — ABNORMAL HIGH (ref 0.0–2.0)
Acid-Base Excess: 5 mmol/L — ABNORMAL HIGH (ref 0.0–2.0)
Acid-Base Excess: 7 mmol/L — ABNORMAL HIGH (ref 0.0–2.0)
Acid-Base Excess: 2 mmol/L (ref 0.0–2.0)
Bicarbonate: 26.6 mmol/L (ref 20.0–28.0)
Bicarbonate: 27 mmol/L (ref 20.0–28.0)
Bicarbonate: 27.7 mmol/L (ref 20.0–28.0)
Bicarbonate: 27.8 mmol/L (ref 20.0–28.0)
Bicarbonate: 28 mmol/L (ref 20.0–28.0)
Bicarbonate: 28 mmol/L (ref 20.0–28.0)
Bicarbonate: 28.8 mmol/L — ABNORMAL HIGH (ref 20.0–28.0)
Bicarbonate: 29.3 mmol/L — ABNORMAL HIGH (ref 20.0–28.0)
Bicarbonate: 29.4 mmol/L — ABNORMAL HIGH (ref 20.0–28.0)
Bicarbonate: 30.5 mmol/L — ABNORMAL HIGH (ref 20.0–28.0)
Bicarbonate: 32.7 mmol/L — ABNORMAL HIGH (ref 20.0–28.0)
Bicarbonate: 29 mmol/L — ABNORMAL HIGH (ref 20.0–28.0)
Calcium, Ion: 0.38 mmol/L — CL (ref 1.15–1.40)
Calcium, Ion: 0.39 mmol/L — CL (ref 1.15–1.40)
Calcium, Ion: 0.39 mmol/L — CL (ref 1.15–1.40)
Calcium, Ion: 0.39 mmol/L — CL (ref 1.15–1.40)
Calcium, Ion: 0.4 mmol/L — CL (ref 1.15–1.40)
Calcium, Ion: 0.41 mmol/L — CL (ref 1.15–1.40)
Calcium, Ion: 0.42 mmol/L — CL (ref 1.15–1.40)
Calcium, Ion: 0.43 mmol/L — CL (ref 1.15–1.40)
Calcium, Ion: 0.5 mmol/L — CL (ref 1.15–1.40)
Calcium, Ion: 1.04 mmol/L — ABNORMAL LOW (ref 1.15–1.40)
Calcium, Ion: 1.06 mmol/L — ABNORMAL LOW (ref 1.15–1.40)
Calcium, Ion: 0.4 mmol/L — CL (ref 1.15–1.40)
HCT: 21 % — ABNORMAL LOW (ref 39.0–52.0)
HCT: 22 % — ABNORMAL LOW (ref 39.0–52.0)
HCT: 22 % — ABNORMAL LOW (ref 39.0–52.0)
HCT: 22 % — ABNORMAL LOW (ref 39.0–52.0)
HCT: 23 % — ABNORMAL LOW (ref 39.0–52.0)
HCT: 24 % — ABNORMAL LOW (ref 39.0–52.0)
HCT: 24 % — ABNORMAL LOW (ref 39.0–52.0)
HCT: 24 % — ABNORMAL LOW (ref 39.0–52.0)
HCT: 24 % — ABNORMAL LOW (ref 39.0–52.0)
HCT: 24 % — ABNORMAL LOW (ref 39.0–52.0)
HCT: 24 % — ABNORMAL LOW (ref 39.0–52.0)
HCT: 23 % — ABNORMAL LOW (ref 39.0–52.0)
Hemoglobin: 7.1 g/dL — ABNORMAL LOW (ref 13.0–17.0)
Hemoglobin: 7.5 g/dL — ABNORMAL LOW (ref 13.0–17.0)
Hemoglobin: 7.5 g/dL — ABNORMAL LOW (ref 13.0–17.0)
Hemoglobin: 7.5 g/dL — ABNORMAL LOW (ref 13.0–17.0)
Hemoglobin: 7.8 g/dL — ABNORMAL LOW (ref 13.0–17.0)
Hemoglobin: 8.2 g/dL — ABNORMAL LOW (ref 13.0–17.0)
Hemoglobin: 8.2 g/dL — ABNORMAL LOW (ref 13.0–17.0)
Hemoglobin: 8.2 g/dL — ABNORMAL LOW (ref 13.0–17.0)
Hemoglobin: 8.2 g/dL — ABNORMAL LOW (ref 13.0–17.0)
Hemoglobin: 8.2 g/dL — ABNORMAL LOW (ref 13.0–17.0)
Hemoglobin: 8.2 g/dL — ABNORMAL LOW (ref 13.0–17.0)
Hemoglobin: 7.8 g/dL — ABNORMAL LOW (ref 13.0–17.0)
O2 Saturation: 36 %
O2 Saturation: 41 %
O2 Saturation: 50 %
O2 Saturation: 52 %
O2 Saturation: 52 %
O2 Saturation: 55 %
O2 Saturation: 55 %
O2 Saturation: 56 %
O2 Saturation: 63 %
O2 Saturation: 87 %
O2 Saturation: 97 %
O2 Saturation: 49 %
Patient temperature: 99.6
Potassium: 3.8 mmol/L (ref 3.5–5.1)
Potassium: 3.9 mmol/L (ref 3.5–5.1)
Potassium: 3.9 mmol/L (ref 3.5–5.1)
Potassium: 4 mmol/L (ref 3.5–5.1)
Potassium: 4 mmol/L (ref 3.5–5.1)
Potassium: 4.1 mmol/L (ref 3.5–5.1)
Potassium: 4.2 mmol/L (ref 3.5–5.1)
Potassium: 4.2 mmol/L (ref 3.5–5.1)
Potassium: 4.2 mmol/L (ref 3.5–5.1)
Potassium: 4.2 mmol/L (ref 3.5–5.1)
Potassium: 4.5 mmol/L (ref 3.5–5.1)
Potassium: 4.2 mmol/L (ref 3.5–5.1)
Sodium: 137 mmol/L (ref 135–145)
Sodium: 137 mmol/L (ref 135–145)
Sodium: 138 mmol/L (ref 135–145)
Sodium: 139 mmol/L (ref 135–145)
Sodium: 139 mmol/L (ref 135–145)
Sodium: 139 mmol/L (ref 135–145)
Sodium: 139 mmol/L (ref 135–145)
Sodium: 139 mmol/L (ref 135–145)
Sodium: 139 mmol/L (ref 135–145)
Sodium: 139 mmol/L (ref 135–145)
Sodium: 140 mmol/L (ref 135–145)
Sodium: 138 mmol/L (ref 135–145)
TCO2: 28 mmol/L (ref 22–32)
TCO2: 29 mmol/L (ref 22–32)
TCO2: 29 mmol/L (ref 22–32)
TCO2: 29 mmol/L (ref 22–32)
TCO2: 30 mmol/L (ref 22–32)
TCO2: 30 mmol/L (ref 22–32)
TCO2: 30 mmol/L (ref 22–32)
TCO2: 31 mmol/L (ref 22–32)
TCO2: 31 mmol/L (ref 22–32)
TCO2: 32 mmol/L (ref 22–32)
TCO2: 34 mmol/L — ABNORMAL HIGH (ref 22–32)
TCO2: 31 mmol/L (ref 22–32)
pCO2, Ven: 49.7 mmHg (ref 44.0–60.0)
pCO2, Ven: 53.4 mmHg (ref 44.0–60.0)
pCO2, Ven: 54.1 mmHg (ref 44.0–60.0)
pCO2, Ven: 54.4 mmHg (ref 44.0–60.0)
pCO2, Ven: 56 mmHg (ref 44.0–60.0)
pCO2, Ven: 56.8 mmHg (ref 44.0–60.0)
pCO2, Ven: 57.9 mmHg (ref 44.0–60.0)
pCO2, Ven: 57.9 mmHg (ref 44.0–60.0)
pCO2, Ven: 58.2 mmHg (ref 44.0–60.0)
pCO2, Ven: 58.5 mmHg (ref 44.0–60.0)
pCO2, Ven: 61.8 mmHg — ABNORMAL HIGH (ref 44.0–60.0)
pCO2, Ven: 55.5 mmHg (ref 44.0–60.0)
pH, Ven: 7.275 (ref 7.250–7.430)
pH, Ven: 7.284 (ref 7.250–7.430)
pH, Ven: 7.287 (ref 7.250–7.430)
pH, Ven: 7.296 (ref 7.250–7.430)
pH, Ven: 7.305 (ref 7.250–7.430)
pH, Ven: 7.31 (ref 7.250–7.430)
pH, Ven: 7.319 (ref 7.250–7.430)
pH, Ven: 7.319 (ref 7.250–7.430)
pH, Ven: 7.32 (ref 7.250–7.430)
pH, Ven: 7.368 (ref 7.250–7.430)
pH, Ven: 7.396 (ref 7.250–7.430)
pH, Ven: 7.326 (ref 7.250–7.430)
pO2, Ven: 101 mmHg — ABNORMAL HIGH (ref 32.0–45.0)
pO2, Ven: 24 mmHg — CL (ref 32.0–45.0)
pO2, Ven: 26 mmHg — CL (ref 32.0–45.0)
pO2, Ven: 30 mmHg — CL (ref 32.0–45.0)
pO2, Ven: 31 mmHg — CL (ref 32.0–45.0)
pO2, Ven: 32 mmHg (ref 32.0–45.0)
pO2, Ven: 33 mmHg (ref 32.0–45.0)
pO2, Ven: 33 mmHg (ref 32.0–45.0)
pO2, Ven: 33 mmHg (ref 32.0–45.0)
pO2, Ven: 37 mmHg (ref 32.0–45.0)
pO2, Ven: 55 mmHg — ABNORMAL HIGH (ref 32.0–45.0)
pO2, Ven: 29 mmHg — CL (ref 32.0–45.0)

## 2018-08-20 LAB — CBC
HCT: 23.2 % — ABNORMAL LOW (ref 39.0–52.0)
Hemoglobin: 7 g/dL — ABNORMAL LOW (ref 13.0–17.0)
MCH: 28.3 pg (ref 26.0–34.0)
MCHC: 30.2 g/dL (ref 30.0–36.0)
MCV: 93.9 fL (ref 80.0–100.0)
Platelets: 97 10*3/uL — ABNORMAL LOW (ref 150–400)
RBC: 2.47 MIL/uL — ABNORMAL LOW (ref 4.22–5.81)
RDW: 19.3 % — ABNORMAL HIGH (ref 11.5–15.5)
WBC: 0.4 10*3/uL — CL (ref 4.0–10.5)
nRBC: 0 % (ref 0.0–0.2)

## 2018-08-20 LAB — RENAL FUNCTION PANEL
Albumin: 1.7 g/dL — ABNORMAL LOW (ref 3.5–5.0)
Albumin: 1.6 g/dL — ABNORMAL LOW (ref 3.5–5.0)
Anion gap: 12 (ref 5–15)
Anion gap: 11 (ref 5–15)
BUN: 52 mg/dL — ABNORMAL HIGH (ref 6–20)
BUN: 52 mg/dL — ABNORMAL HIGH (ref 6–20)
CO2: 30 mmol/L (ref 22–32)
CO2: 30 mmol/L (ref 22–32)
Calcium: 7.9 mg/dL — ABNORMAL LOW (ref 8.9–10.3)
Calcium: 7.9 mg/dL — ABNORMAL LOW (ref 8.9–10.3)
Chloride: 94 mmol/L — ABNORMAL LOW (ref 98–111)
Chloride: 95 mmol/L — ABNORMAL LOW (ref 98–111)
Creatinine, Ser: 2.59 mg/dL — ABNORMAL HIGH (ref 0.61–1.24)
Creatinine, Ser: 2.5 mg/dL — ABNORMAL HIGH (ref 0.61–1.24)
GFR calc Af Amer: 32 mL/min — ABNORMAL LOW (ref >60)
GFR calc Af Amer: 33 mL/min — ABNORMAL LOW (ref >60)
GFR calc non Af Amer: 27 mL/min — ABNORMAL LOW (ref >60)
GFR calc non Af Amer: 28 mL/min — ABNORMAL LOW (ref >60)
Glucose, Bld: 246 mg/dL — ABNORMAL HIGH (ref 70–99)
Glucose, Bld: 287 mg/dL — ABNORMAL HIGH (ref 70–99)
Phosphorus: 1.9 mg/dL — ABNORMAL LOW (ref 2.5–4.6)
Phosphorus: 1.9 mg/dL — ABNORMAL LOW (ref 2.5–4.6)
Potassium: 4.4 mmol/L (ref 3.5–5.1)
Potassium: 4.3 mmol/L (ref 3.5–5.1)
Sodium: 136 mmol/L (ref 135–145)
Sodium: 136 mmol/L (ref 135–145)

## 2018-08-20 LAB — TYPE AND SCREEN
ABO/RH(D): O POS
Antibody Screen: NEGATIVE
Unit division: 0
Unit division: 0

## 2018-08-20 LAB — BPAM RBC
Blood Product Expiration Date: 202004262359
Blood Product Expiration Date: 202004302359
ISSUE DATE / TIME: 202004202053
ISSUE DATE / TIME: 202004220255
Unit Type and Rh: 5100
Unit Type and Rh: 5100

## 2018-08-20 LAB — MAGNESIUM: Magnesium: 1.9 mg/dL (ref 1.7–2.4)

## 2018-08-20 LAB — GLUCOSE, CAPILLARY
Glucose-Capillary: 200 mg/dL — ABNORMAL HIGH (ref 70–99)
Glucose-Capillary: 202 mg/dL — ABNORMAL HIGH (ref 70–99)
Glucose-Capillary: 202 mg/dL — ABNORMAL HIGH (ref 70–99)
Glucose-Capillary: 230 mg/dL — ABNORMAL HIGH (ref 70–99)
Glucose-Capillary: 236 mg/dL — ABNORMAL HIGH (ref 70–99)
Glucose-Capillary: 249 mg/dL — ABNORMAL HIGH (ref 70–99)
Glucose-Capillary: 270 mg/dL — ABNORMAL HIGH (ref 70–99)

## 2018-08-20 LAB — APTT: aPTT: 30 seconds (ref 24–36)

## 2018-08-20 LAB — CALCIUM, IONIZED: Calcium, Ionized, Serum: 4.3 mg/dL — ABNORMAL LOW (ref 4.5–5.6)

## 2018-08-20 MED ORDER — SODIUM CHLORIDE 0.9 % IV SOLN
1.0000 g | Freq: Two times a day (BID) | INTRAVENOUS | Status: DC
  Administered 2018-08-20 – 2018-08-21 (×2): 1 g via INTRAVENOUS
  Filled 2018-08-20 (×2): qty 1

## 2018-08-20 MED ORDER — ALTEPLASE 2 MG IJ SOLR
2.0000 mg | Freq: Once | INTRAMUSCULAR | Status: AC
  Administered 2018-08-20: 16:00:00 2 mg
  Filled 2018-08-20: qty 2

## 2018-08-20 NOTE — Progress Notes (Signed)
Pharmacy Antibiotic Note  [name] Long is a 53 y.o. male admitted on 08/17/2018 with sepsis.  Pharmacy has been consulted for vanc/zosyn dosing. Patient has been treated already with Zosyn for diverticulitis and has been positive for COVID-19 currently sedated on vent and CRRT as well as pressors. Now 4/23 new resp cx growing citrobacter and Ecoli to change Zosyn to meropenem per CCM  Today, 08/20/2018 - afebrile - WBC 0.4, down - SCr 2.59 down, CRRT continues - PCT 3.7  Plan: - per CRRT dosing, meropenem 1g IV q12  Height: 5\' 10"  (177.8 cm) Weight: 291 lb 10.7 oz (132.3 kg) IBW/kg (Calculated) : 73  Temp (24hrs), Avg:99.4 F (37.4 C), Min:98.9 F (37.2 C), Max:100.1 F (37.8 C)  Recent Labs  Lab 08/17/18 0430 08/17/18 1100 08/17/18 1620 08/18/18 0323 08/18/18 1553 08/18/18 2143 08/19/18 0559 08/19/18 2030 08/20/18 0347  WBC 0.8*  --  0.8* 1.0*  --  0.6* 0.5*  --  0.4*  CREATININE 2.68*  --  2.93* 3.52*  3.53* 3.52*  --  2.96* 2.78* 2.59*  LATICACIDVEN 1.4 2.1*  --   --   --   --   --   --   --     Estimated Creatinine Clearance: 45.6 mL/min (A) (by C-G formula based on SCr of 2.59 mg/dL (H)).    No Known Allergies   Antimicrobials this admission: Zosyn 4/2 >> 4/7, then resumed 4/12 >> 4/18, 4/19 >> 4/23 Azithromycin 4/2 >> 4/6 Hydroxychloroquine 4/2 >> 4/6 Vancomycin 4/12 >> 4/13, 4/19 >> 4/23 Meropenem 4/23 >>  Dose adjustments this admission: 4/6: Zosyn changed to 2.25g q6h for CRRT 4/21: Zosyn increased to 3.375g q6h for CRRT  Microbiology results: 3/30: COVID +  outpt PTA  4/2 MRSA PCR: negative  4/2 hepatitis panel: neg 4/12 BCx: neg FINAL 4/19 repeat Covid for clearance: positive 4/20 Trach asp: collected but not received by Micro Lab, CCM aware 4/22 rpt Trach asp: moderate GNR - citrobacter koseri and Ecoli  Thank you for allowing pharmacy to be a part of this patient's care.   Adrian Saran, PharmD, BCPS 08/20/2018 2:44 PM

## 2018-08-20 NOTE — Progress Notes (Signed)
Inpatient Diabetes Program Recommendations  AACE/ADA: New Consensus Statement on Inpatient Glycemic Control (2015)  Target Ranges:  Prepandial:   less than 140 mg/dL      Peak postprandial:   less than 180 mg/dL (1-2 hours)      Critically ill patients:  140 - 180 mg/dL   Lab Results  Component Value Date   GLUCAP 202 (H) 08/20/2018    Review of Glycemic Control  Blood sugars in 200s. If appropriate, add TF coverage.  Inpatient Diabetes Program Recommendations:     Novolog 2 units Q4H (Do not give if TF is Held)  Continue to follow.  Thank you. Lorenda Peck, RD, LDN, CDE Inpatient Diabetes Coordinator [phone]

## 2018-08-20 NOTE — Progress Notes (Addendum)
TPA ordered for the white port due to it being sluggish but provides blood return and flushes per RN. Advised the nurse to assess the line for kinks under the biopatch before we instill tPA as today his dressing is due. Nurse asked to contact VAS team once tPA arrives.  Catalina Pizza

## 2018-08-20 NOTE — Progress Notes (Signed)
Patient ID: [name] Long, male   DOB: August 17, 1965, 53 y.o.   MRN: [id number] S: remains febrile and now with worsening oxygen requirements overnight O:BP (!) 105/41   Pulse (!) 110   Temp 100.1 F (37.8 C) (Axillary)   Resp (!) 21   Ht '5\' 10"'  (1.778 m)   Wt 132.3 kg   SpO2 100%   BMI 41.85 kg/m   Intake/Output Summary (Last 24 hours) at 08/20/2018 1217 Last data filed at 08/20/2018 1200 Gross per 24 hour  Intake 4580.37 ml  Output 5906 ml  Net -1325.63 ml   Intake/Output: I/O last 3 completed shifts: In: 8432.5 [I.V.:4688.5; Blood:365; Other:300; NG/GT:2600; IV Piggyback:479] Out: 5364 [WOEHO:1224; Stool:510]  Intake/Output this shift:  Total I/O In: 954.2 [I.V.:549.2; NG/GT:405] Out: 1261 [Other:1161; Stool:100] Weight change: -1.7 kg Gen: intubated and critically ill Direct physical contact not performed due to covid-19 infection and relied on discussions with PCCM about physical exam findings.   Recent Labs  Lab 08/17/18 0430  08/17/18 1620  08/18/18 0323  08/18/18 1553  08/19/18 0559  08/19/18 2030 08/20/18 0028 08/20/18 0038 08/20/18 0340 08/20/18 0347 08/20/18 0351 08/20/18 1140 08/20/18 1153  NA 136   < > 137   < > 136  137   < > 137   < > 137   < > 137 139 137 137 136 138 139 138  K 4.0   < > 4.5   < > 5.0  5.0   < > 4.8   < > 4.3   < > 4.0 4.0 4.2 4.5 4.4 4.2 4.0 4.2  CL 100  --  95*  --  96*  97*  --  98  --  97*  --  96*  --   --   --  94*  --   --   --   CO2 25  --  28  --  28  28  --  26  --  27  --  28  --   --   --  30  --   --   --   GLUCOSE 245*  --  269*  --  204*  207*  --  241*  --  231*  --  203*  --   --   --  246*  --   --   --   BUN 37*  --  42*  --  55*  55*  --  61*  --  57*  --  54*  --   --   --  52*  --   --   --   CREATININE 2.68*  --  2.93*  --  3.52*  3.53*  --  3.52*  --  2.96*  --  2.78*  --   --   --  2.59*  --   --   --   ALBUMIN 2.0*  2.0*  --  2.0*  --  1.9*  --  1.8*  --  2.1*  --  1.9*  --   --   --  1.7*  --   --   --    CALCIUM 6.9*  --  7.7*  --  7.6*  7.6*  --  7.7*  --  8.0*  --  7.7*  --   --   --  7.9*  --   --   --   PHOS 3.7  --  3.2  --  5.1*  --  3.3  --  2.4*  --  1.4*  --   --   --  1.9*  --   --   --   AST 15  --   --   --   --   --   --   --   --   --   --   --   --   --   --   --   --   --   ALT 25  --   --   --   --   --   --   --   --   --   --   --   --   --   --   --   --   --    < > = values in this interval not displayed.   Liver Function Tests: Recent Labs  Lab 08/17/18 0430  08/19/18 0559 08/19/18 2030 08/20/18 0347  AST 15  --   --   --   --   ALT 25  --   --   --   --   ALKPHOS 59  --   --   --   --   BILITOT 1.3*  --   --   --   --   PROT 5.5*  --   --   --   --   ALBUMIN 2.0*  2.0*   < > 2.1* 1.9* 1.7*   < > = values in this interval not displayed.   No results for input(s): LIPASE, AMYLASE in the last 168 hours. No results for input(s): AMMONIA in the last 168 hours. CBC: Recent Labs  Lab 08/17/18 1620  08/18/18 0323  08/18/18 2143  08/19/18 0559  08/20/18 0347 08/20/18 0351 08/20/18 1140 08/20/18 1153  WBC 0.8*  --  1.0*  --  0.6*  --  0.5*  --  0.4*  --   --   --   HGB 6.8*   < > 7.0*   < > 6.6*   < > 7.6*   < > 7.0* 8.2* 8.2* 8.2*  HCT 22.5*   < > 23.2*   < > 21.6*   < > 24.3*   < > 23.2* 24.0* 24.0* 24.0*  MCV 95.7  --  94.7  --  96.4  --  94.2  --  93.9  --   --   --   PLT 159  --  165  --  129*  --  111*  --  97*  --   --   --    < > = values in this interval not displayed.   Cardiac Enzymes: No results for input(s): CKTOTAL, CKMB, CKMBINDEX, TROPONINI in the last 168 hours. CBG: Recent Labs  Lab 08/19/18 2017 08/20/18 0035 08/20/18 0338 08/20/18 0739 08/20/18 1110  GLUCAP 195* 249* 236* 200* 202*    Iron Studies: No results for input(s): IRON, TIBC, TRANSFERRIN, FERRITIN in the last 72 hours. Studies/Results: Dg Chest Port 1 View  Result Date: 08/19/2018 CLINICAL DATA:  53 year old male with pneumonia, sepsis, positive COVID-19. EXAM:  PORTABLE CHEST 1 VIEW COMPARISON:  08/18/2018 and earlier. FINDINGS: Portable AP semi upright view at 0435 hours. Stable endotracheal tube tip at the level the clavicles. Enteric tube courses to the abdomen, tip not included. Stable right IJ central line. The patient is more rotated to the right. Stable cardiomegaly and mediastinal contours. Stable somewhat low lung volumes. Patchy in coarse bilateral pulmonary opacity has not  significantly changed since 08/17/2018. No pneumothorax or pleural effusion. Paucity of bowel gas in the upper abdomen. IMPRESSION: 1. Stable lines and tubes. 2. Patchy bilateral pulmonary opacity due to COVID-19 pneumonia not significantly changed since 08/17/18. Electronically Signed   By: Genevie Ann M.D.   On: 08/19/2018 06:57   . sodium chloride   Intravenous Once  . sodium chloride   Intravenous Once  . chlorhexidine gluconate (MEDLINE KIT)  15 mL Mouth Rinse BID  . Chlorhexidine Gluconate Cloth  6 each Topical Daily  . feeding supplement (PRO-STAT SUGAR FREE 64)  60 mL Per Tube BID  . heparin injection (subcutaneous)  5,000 Units Subcutaneous Q12H  . insulin aspart  0-9 Units Subcutaneous Q4H  . mouth rinse  15 mL Mouth Rinse 10 times per day  . pantoprazole (PROTONIX) IV  40 mg Intravenous Q24H  . sodium chloride flush  10-40 mL Intracatheter Q12H    BMET    Component Value Date/Time   NA 138 08/20/2018 1153   K 4.2 08/20/2018 1153   CL 94 (L) 08/20/2018 0347   CO2 30 08/20/2018 0347   GLUCOSE 246 (H) 08/20/2018 0347   BUN 52 (H) 08/20/2018 0347   CREATININE 2.59 (H) 08/20/2018 0347   CALCIUM 7.9 (L) 08/20/2018 0347   GFRNONAA 27 (L) 08/20/2018 0347   GFRAA 32 (L) 08/20/2018 0347   CBC    Component Value Date/Time   WBC 0.4 (LL) 08/20/2018 0347   RBC 2.47 (L) 08/20/2018 0347   HGB 8.2 (L) 08/20/2018 1153   HCT 24.0 (L) 08/20/2018 1153   PLT 97 (L) 08/20/2018 0347   MCV 93.9 08/20/2018 0347   MCH 28.3 08/20/2018 0347   MCHC 30.2 08/20/2018 0347   RDW  19.3 (H) 08/20/2018 0347   LYMPHSABS 0.6 (L) 08/13/2018 0420   MONOABS 0.4 08/13/2018 0420   EOSABS 0.1 08/13/2018 0420   BASOSABS 0.0 08/13/2018 0420    Assessment/Plan:  1. ARF in setting of coronavirus sepsis. Presumably due to ischemic ATN although unclear if there is direct viral injury with covid-19. He has been receiving CVVHD since 08/02/18 in setting of worsening respiratory failure and metabolic acidosis.  1. HD catheter was changed to right fem site on 08/09/18 andagain on 08/18/18 due to poor function and frequent clotting. 2. Tolerating UF with CVVHD with some improvement of FiO2 and PEEP over the last 24 hours. 3. Wean vasopressin as able 2. Coronavirus PNA with sepsis and VDRF- intubated since 08/02/18.  3. ARDS- concern for possible HCAP on top of covid-19. Now on zosyn and vancomycin per PCCM. Will likely require prone ventilation again. 4. Septic shock- vasopressin added due to Afib with RVR related to levophed. Per PCCM added HCAP coverage started 4/19 5. ABLA- off of heparin.  6. Hypophosphatemia- replete as needed 7. Disposition- poor overall prognosis.  Agree with starting discussion with family about withdrawing care and transitioning to comfort measures. 8. Perforated diverticulum  Donetta Potts, MD [facility] [phone]

## 2018-08-20 NOTE — Progress Notes (Signed)
NAME:  [name] Long, MRN:  [id number], DOB:  [birth year], LOS: 25 ADMISSION DATE:  08/19/2018, CONSULTATION DATE:  07/30/2018 REFERRING MD:  07/30/2018, CHIEF COMPLAINT:   Brief History   53 yo male presented to [facility] with several days of abdominal pain from diverticulitis with retained perforation.  Developed pneumonia with hypoxia from [name].  Transferred to [facility]. Actemra not given due to diverticulitis  Past Medical History  DM2, HTN, Former smoker, OSA, A fib  [place] [facility] Events   4/01 admission for diverticulitis 4/02 PCCM consulted for worsening dyspnea, hypoxemia 4/02 Proned  4/03 Proned  4/05 Improved oxygenation however ABG with respiratory and metabolic acidosis. Poor UOP 4/06 Supine; completed plaquenil 4/07 CVVHD, difficulty with filters clotting  4/08 CVVHD ongoing, AFwRVR afternoon of 4/8, started on amio gtt   4/12 hemodialysis catheter changed to right femoral location; brief episode of PEA resolved sponateously 08/11/2018 hemodialysis catheter and filter for CRRT machine or clotting off. 08/11/2018 started on heparin drip for atrial fibrillation in filter clotting on CRRT machine 4/18- Was off sedation transiently 4/17, then had to be started on Precedex. He has tolerated pressure support 8 very well, used PRVC auto mode for the rest of the day yesterday 4/17 Had some agitation requiring increased fentanyl, sedation overnight.  Resultant hypotension and low-dose norepinephrine started.  Now weaned to off.  4/19: had episode of bradycardia, hypotension, hypoxia. Etiology unclear. Started back on pressors and deep sedation.  4/20: X-ray looks worse.  Added HCAP coverage on 4/19, still requiring high PEEP and FiO2 4/21:peep and fio2 down, remains heavily sedated, still on pressors.  Placed new dialysis catheter as femoral had been clotting off 4/22: Weaning off pressors.  Again we will taper back sedation once again.  Still unresponsive from aggressive sedation, did get 1  unit of blood 4/23: Spoke to wife over the phone in regards to goals of care.  Starting to entertain withdrawal.  No significant changes overnight. Consults:  Renal GI signed off Surgery signed off Procedures:  ETT 4/2 >>  L IJ HD 4/6 >> 4/12 R Rad Aline 4/6 >>  4/12 right femoral HD catheter 24 cm>> Right IJ triple-lumen dialysis catheter 4/21>>> Significant Diagnostic Tests:  CT chest 4/01 [name] Long) >> b/l GGO in periphery CT abd/pelvis 4/01 [name] Long) >> perforated diverticulum lower descending colon, fatty liver with ?cirrhosis, splenomegaly 18.4 cm  Micro Data:  COVID 3/30 (PCP office) >> DETECTED Blood 4/12 >> neg COVID at [facility] 08/16/2018 (repeat for clearance) - POSITIVE Tracheal aspirate 4/20 > 4/22 respiratory culture: Moderate gram-negative rods>>> Antimicrobials:  Zosyn 4/1 >> 4/7 Zoysn 4/12 >>4/17 Zosyn 4/19 > Vancomycin 4/19 > 4/23  Interim history/subjective:   No changes overnight Objective   Blood pressure (Abnormal) 105/41, pulse (Abnormal) 124, temperature 99.3 F (37.4 C), temperature source Axillary, resp. rate (Abnormal) 25, height 5\' 10"  (1.778 m), weight 132.3 kg, SpO2 93 %. CVP:  [11 mmHg-18 mmHg] 16 mmHg  Vent Mode: PRVC FiO2 (%):  [50 %] 50 % Set Rate:  [20 bmp] 20 bmp Vt Set:  [510 mL] 510 mL PEEP:  [10 cmH20] 10 cmH20 Plateau Pressure:  [20 cmH20-27 cmH20] 27 cmH20   Intake/Output Summary (Last 24 hours) at 08/20/2018 0928 Last data filed at 08/20/2018 0900 Gross per 24 hour  Intake 4470.54 ml  Output 5860 ml  Net -1389.46 ml   Filed Weights   08/18/18 0359 08/19/18 0500 08/20/18 0426  Weight: 133.4 kg 134 kg 132.3 kg   General: This is an obese chronically  ill and critically ill-appearing 53 year old male patient who remains heavily sedated on mechanical ventilation HEENT normocephalic atraumatic orally intubated fever blisters on lips seem to be improving some no JVD no sclerae edema Pulmonary: Clear, diminished equal chest rise  weaning PEEP and FiO2, this is unchanged from yesterday currently a PEEP of 10 FiO2 and 50 Cardiac: Regular irregular no murmur rub or gallop Abdomen: Soft, positive bowel sounds, liquid stools with Flexi-Seal in place Extremities: Warm, palpable pulses, dependent edema Neuro: Grimaces to noxious stimulus only  Resolved Hospital Problem list   Mixed Acidosis, ARDS from COVID-19 Septic shock resolved 4/22  Assessment & Plan:   Acute hypoxemic respiratory failure due to  COVID-19 infection (repeat test for clearance 4/19 remains positive) complicated by presumed ventilator associated pneumonia and ARDS -No significant change with FiO2 and PEEP requirements Plan Continuing ARDS protocol  VAP bundle  Volume removal with CRRT  PAD protocol RASS goal -1  Day #5 nosocomial coverage, growing gram-negative rods preliminarily in sputum will discontinue vancomycin  A.m. chest x-ray    Acute metabolic encephalopathy Plan Continue to titrate Dilaudid and Versed  RASS: As above  Ongoing discussion about goals of care  Atrial fibrillation with RVR: seems to be in sinus now Plan Holding anticoagulation due to blood loss Continue amiodarone  Acute renal failure; now w/ HD access issues as of 4/21 Plan Changing CRRT, he is not a candidate for long-term dialysis at this point  Anemia critical illness now maybe a component of acute blood loss as well. Some bloody plug in ET suction and melenic stools. Occult blood testing positive.  -hgb transfused 4/21, hemoglobin drifting down again Plan For hemoglobin less than 7 Trend CBC  Leukopenia in setting of sepsis Plan Trend CBC Treat infection  DM Plan Sliding scale insulin  MSK/DERM Plan Wound ostomy nurse following    Best practice:  Diet: NPO, tube feedings trickle DVT prophylaxis: SCDs GI prophylaxis: pantoprazole Mobility: bed rest Code Status: No CPR or defibrillation but full medical care Family Communication: I updated  the wife via telephone today on 4/21 Disposition: No improvement with the exception of being off from vasoactive drips.  Concerned about long-term outcome here unlikely to make decent functional recovery.  We will continue to support with mechanical ventilation, wean actively PEEP and FiO2, volume removal with CRRT, antibiotics for infection.  Will speak with his wife again today, I am not sure we should continue this based on his dismal outlook even if he were to survive   Erick Colace ACNP-BC [place] Pager # [phone] OR # [phone] if no answer  08/20/2018 9:28 AM

## 2018-08-20 NOTE — Progress Notes (Signed)
[place] tPA'ed. Will remove at 530pm [place]

## 2018-08-20 NOTE — Progress Notes (Signed)
Spoke to Butch Penny his wife.  We discussed current lack of progress & poor chance for functional recovery Plan Family has a funeral to attend on 4/24 After this they will plan on coming in and we will advance to w/d of care  Erick Colace ACNP-BC [place] Pager # [phone] OR # [phone] if no answer

## 2018-08-21 LAB — POCT I-STAT, CHEM 8
BUN: 35 mg/dL — ABNORMAL HIGH (ref 6–20)
BUN: 38 mg/dL — ABNORMAL HIGH (ref 6–20)
BUN: 45 mg/dL — ABNORMAL HIGH (ref 6–20)
BUN: 38 mg/dL — ABNORMAL HIGH (ref 6–20)
BUN: 46 mg/dL — ABNORMAL HIGH (ref 6–20)
BUN: 39 mg/dL — ABNORMAL HIGH (ref 6–20)
BUN: 45 mg/dL — ABNORMAL HIGH (ref 6–20)
Calcium, Ion: 0.44 mmol/L — CL (ref 1.15–1.40)
Calcium, Ion: 0.41 mmol/L — CL (ref 1.15–1.40)
Calcium, Ion: 1.13 mmol/L — ABNORMAL LOW (ref 1.15–1.40)
Calcium, Ion: 0.56 mmol/L — CL (ref 1.15–1.40)
Calcium, Ion: 1.09 mmol/L — ABNORMAL LOW (ref 1.15–1.40)
Calcium, Ion: 0.53 mmol/L — CL (ref 1.15–1.40)
Calcium, Ion: 1.07 mmol/L — ABNORMAL LOW (ref 1.15–1.40)
Chloride: 92 mmol/L — ABNORMAL LOW (ref 98–111)
Chloride: 89 mmol/L — ABNORMAL LOW (ref 98–111)
Chloride: 91 mmol/L — ABNORMAL LOW (ref 98–111)
Chloride: 91 mmol/L — ABNORMAL LOW (ref 98–111)
Chloride: 92 mmol/L — ABNORMAL LOW (ref 98–111)
Chloride: 90 mmol/L — ABNORMAL LOW (ref 98–111)
Chloride: 92 mmol/L — ABNORMAL LOW (ref 98–111)
Creatinine, Ser: 1.8 mg/dL — ABNORMAL HIGH (ref 0.61–1.24)
Creatinine, Ser: 2 mg/dL — ABNORMAL HIGH (ref 0.61–1.24)
Creatinine, Ser: 2.5 mg/dL — ABNORMAL HIGH (ref 0.61–1.24)
Creatinine, Ser: 2.1 mg/dL — ABNORMAL HIGH (ref 0.61–1.24)
Creatinine, Ser: 2.5 mg/dL — ABNORMAL HIGH (ref 0.61–1.24)
Creatinine, Ser: 2 mg/dL — ABNORMAL HIGH (ref 0.61–1.24)
Creatinine, Ser: 2.5 mg/dL — ABNORMAL HIGH (ref 0.61–1.24)
Glucose, Bld: 279 mg/dL — ABNORMAL HIGH (ref 70–99)
Glucose, Bld: 291 mg/dL — ABNORMAL HIGH (ref 70–99)
Glucose, Bld: 328 mg/dL — ABNORMAL HIGH (ref 70–99)
Glucose, Bld: 246 mg/dL — ABNORMAL HIGH (ref 70–99)
Glucose, Bld: 265 mg/dL — ABNORMAL HIGH (ref 70–99)
Glucose, Bld: 258 mg/dL — ABNORMAL HIGH (ref 70–99)
Glucose, Bld: 277 mg/dL — ABNORMAL HIGH (ref 70–99)
HCT: 24 % — ABNORMAL LOW (ref 39.0–52.0)
HCT: 23 % — ABNORMAL LOW (ref 39.0–52.0)
HCT: 25 % — ABNORMAL LOW (ref 39.0–52.0)
HCT: 23 % — ABNORMAL LOW (ref 39.0–52.0)
HCT: 26 % — ABNORMAL LOW (ref 39.0–52.0)
HCT: 23 % — ABNORMAL LOW (ref 39.0–52.0)
HCT: 24 % — ABNORMAL LOW (ref 39.0–52.0)
Hemoglobin: 8.2 g/dL — ABNORMAL LOW (ref 13.0–17.0)
Hemoglobin: 7.8 g/dL — ABNORMAL LOW (ref 13.0–17.0)
Hemoglobin: 8.5 g/dL — ABNORMAL LOW (ref 13.0–17.0)
Hemoglobin: 7.8 g/dL — ABNORMAL LOW (ref 13.0–17.0)
Hemoglobin: 8.8 g/dL — ABNORMAL LOW (ref 13.0–17.0)
Hemoglobin: 7.8 g/dL — ABNORMAL LOW (ref 13.0–17.0)
Hemoglobin: 8.2 g/dL — ABNORMAL LOW (ref 13.0–17.0)
Potassium: 4.1 mmol/L (ref 3.5–5.1)
Potassium: 3.9 mmol/L (ref 3.5–5.1)
Potassium: 4.1 mmol/L (ref 3.5–5.1)
Potassium: 3.8 mmol/L (ref 3.5–5.1)
Potassium: 4 mmol/L (ref 3.5–5.1)
Potassium: 3.7 mmol/L (ref 3.5–5.1)
Potassium: 3.8 mmol/L (ref 3.5–5.1)
Sodium: 139 mmol/L (ref 135–145)
Sodium: 136 mmol/L (ref 135–145)
Sodium: 138 mmol/L (ref 135–145)
Sodium: 136 mmol/L (ref 135–145)
Sodium: 138 mmol/L (ref 135–145)
Sodium: 137 mmol/L (ref 135–145)
Sodium: 137 mmol/L (ref 135–145)
TCO2: 28 mmol/L (ref 22–32)
TCO2: 31 mmol/L (ref 22–32)
TCO2: 33 mmol/L — ABNORMAL HIGH (ref 22–32)
TCO2: 30 mmol/L (ref 22–32)
TCO2: 32 mmol/L (ref 22–32)
TCO2: 31 mmol/L (ref 22–32)
TCO2: 34 mmol/L — ABNORMAL HIGH (ref 22–32)

## 2018-08-21 LAB — POCT I-STAT 7, (LYTES, BLD GAS, ICA,H+H)
Acid-Base Excess: 11 mmol/L — ABNORMAL HIGH (ref 0.0–2.0)
Acid-Base Excess: 6 mmol/L — ABNORMAL HIGH (ref 0.0–2.0)
Acid-Base Excess: 6 mmol/L — ABNORMAL HIGH (ref 0.0–2.0)
Acid-Base Excess: 11 mmol/L — ABNORMAL HIGH (ref 0.0–2.0)
Bicarbonate: 32.7 mmol/L — ABNORMAL HIGH (ref 20.0–28.0)
Bicarbonate: 36.3 mmol/L — ABNORMAL HIGH (ref 20.0–28.0)
Bicarbonate: 33 mmol/L — ABNORMAL HIGH (ref 20.0–28.0)
Bicarbonate: 36 mmol/L — ABNORMAL HIGH (ref 20.0–28.0)
Calcium, Ion: 0.55 mmol/L — CL (ref 1.15–1.40)
Calcium, Ion: 1.1 mmol/L — ABNORMAL LOW (ref 1.15–1.40)
Calcium, Ion: 0.5 mmol/L — CL (ref 1.15–1.40)
Calcium, Ion: 1.08 mmol/L — ABNORMAL LOW (ref 1.15–1.40)
HCT: 21 % — ABNORMAL LOW (ref 39.0–52.0)
HCT: 22 % — ABNORMAL LOW (ref 39.0–52.0)
HCT: 24 % — ABNORMAL LOW (ref 39.0–52.0)
HCT: 22 % — ABNORMAL LOW (ref 39.0–52.0)
Hemoglobin: 7.1 g/dL — ABNORMAL LOW (ref 13.0–17.0)
Hemoglobin: 7.5 g/dL — ABNORMAL LOW (ref 13.0–17.0)
Hemoglobin: 8.2 g/dL — ABNORMAL LOW (ref 13.0–17.0)
Hemoglobin: 7.5 g/dL — ABNORMAL LOW (ref 13.0–17.0)
O2 Saturation: 56 %
O2 Saturation: 99 %
O2 Saturation: 54 %
O2 Saturation: 96 %
Potassium: 3.7 mmol/L (ref 3.5–5.1)
Potassium: 3.8 mmol/L (ref 3.5–5.1)
Potassium: 3.7 mmol/L (ref 3.5–5.1)
Potassium: 3.8 mmol/L (ref 3.5–5.1)
Sodium: 137 mmol/L (ref 135–145)
Sodium: 138 mmol/L (ref 135–145)
Sodium: 139 mmol/L (ref 135–145)
Sodium: 137 mmol/L (ref 135–145)
TCO2: 35 mmol/L — ABNORMAL HIGH (ref 22–32)
TCO2: 38 mmol/L — ABNORMAL HIGH (ref 22–32)
TCO2: 35 mmol/L — ABNORMAL HIGH (ref 22–32)
TCO2: 38 mmol/L — ABNORMAL HIGH (ref 22–32)
pCO2 arterial: 56.9 mmHg — ABNORMAL HIGH (ref 32.0–48.0)
pCO2 arterial: 60.7 mmHg — ABNORMAL HIGH (ref 32.0–48.0)
pCO2 arterial: 59.8 mmHg — ABNORMAL HIGH (ref 32.0–48.0)
pCO2 arterial: 53.3 mmHg — ABNORMAL HIGH (ref 32.0–48.0)
pH, Arterial: 7.34 — ABNORMAL LOW (ref 7.350–7.450)
pH, Arterial: 7.413 (ref 7.350–7.450)
pH, Arterial: 7.35 (ref 7.350–7.450)
pH, Arterial: 7.437 (ref 7.350–7.450)
pO2, Arterial: 149 mmHg — ABNORMAL HIGH (ref 83.0–108.0)
pO2, Arterial: 32 mmHg — CL (ref 83.0–108.0)
pO2, Arterial: 31 mmHg — CL (ref 83.0–108.0)
pO2, Arterial: 81 mmHg — ABNORMAL LOW (ref 83.0–108.0)

## 2018-08-21 LAB — RENAL FUNCTION PANEL
Albumin: 1.6 g/dL — ABNORMAL LOW (ref 3.5–5.0)
Albumin: 1.6 g/dL — ABNORMAL LOW (ref 3.5–5.0)
Anion gap: 13 (ref 5–15)
Anion gap: 13 (ref 5–15)
BUN: 55 mg/dL — ABNORMAL HIGH (ref 6–20)
BUN: 53 mg/dL — ABNORMAL HIGH (ref 6–20)
CO2: 32 mmol/L (ref 22–32)
CO2: 32 mmol/L (ref 22–32)
Calcium: 8.5 mg/dL — ABNORMAL LOW (ref 8.9–10.3)
Calcium: 8.4 mg/dL — ABNORMAL LOW (ref 8.9–10.3)
Chloride: 93 mmol/L — ABNORMAL LOW (ref 98–111)
Chloride: 93 mmol/L — ABNORMAL LOW (ref 98–111)
Creatinine, Ser: 2.59 mg/dL — ABNORMAL HIGH (ref 0.61–1.24)
Creatinine, Ser: 2.48 mg/dL — ABNORMAL HIGH (ref 0.61–1.24)
GFR calc Af Amer: 32 mL/min — ABNORMAL LOW (ref >60)
GFR calc Af Amer: 33 mL/min — ABNORMAL LOW (ref >60)
GFR calc non Af Amer: 27 mL/min — ABNORMAL LOW (ref >60)
GFR calc non Af Amer: 29 mL/min — ABNORMAL LOW (ref >60)
Glucose, Bld: 238 mg/dL — ABNORMAL HIGH (ref 70–99)
Glucose, Bld: 295 mg/dL — ABNORMAL HIGH (ref 70–99)
Phosphorus: 2 mg/dL — ABNORMAL LOW (ref 2.5–4.6)
Phosphorus: 2.3 mg/dL — ABNORMAL LOW (ref 2.5–4.6)
Potassium: 3.9 mmol/L (ref 3.5–5.1)
Potassium: 3.8 mmol/L (ref 3.5–5.1)
Sodium: 138 mmol/L (ref 135–145)
Sodium: 138 mmol/L (ref 135–145)

## 2018-08-21 LAB — CBC
HCT: 23.5 % — ABNORMAL LOW (ref 39.0–52.0)
Hemoglobin: 7 g/dL — ABNORMAL LOW (ref 13.0–17.0)
MCH: 28.2 pg (ref 26.0–34.0)
MCHC: 29.8 g/dL — ABNORMAL LOW (ref 30.0–36.0)
MCV: 94.8 fL (ref 80.0–100.0)
Platelets: 104 10*3/uL — ABNORMAL LOW (ref 150–400)
RBC: 2.48 MIL/uL — ABNORMAL LOW (ref 4.22–5.81)
RDW: 19 % — ABNORMAL HIGH (ref 11.5–15.5)
WBC: 0.4 10*3/uL — CL (ref 4.0–10.5)
nRBC: 5.6 % — ABNORMAL HIGH (ref 0.0–0.2)

## 2018-08-21 LAB — GLUCOSE, CAPILLARY
Glucose-Capillary: 246 mg/dL — ABNORMAL HIGH (ref 70–99)
Glucose-Capillary: 220 mg/dL — ABNORMAL HIGH (ref 70–99)
Glucose-Capillary: 245 mg/dL — ABNORMAL HIGH (ref 70–99)
Glucose-Capillary: 273 mg/dL — ABNORMAL HIGH (ref 70–99)
Glucose-Capillary: 271 mg/dL — ABNORMAL HIGH (ref 70–99)

## 2018-08-21 LAB — CALCIUM, IONIZED: Calcium, Ionized, Serum: 4.4 mg/dL — ABNORMAL LOW (ref 4.5–5.6)

## 2018-08-21 LAB — CULTURE, RESPIRATORY W GRAM STAIN: Gram Stain: NONE SEEN

## 2018-08-21 LAB — APTT: aPTT: 27 seconds (ref 24–36)

## 2018-08-21 LAB — MAGNESIUM: Magnesium: 2.1 mg/dL (ref 1.7–2.4)

## 2018-08-21 MED ORDER — SODIUM CHLORIDE 0.9 % IV SOLN
2.0000 g | Freq: Two times a day (BID) | INTRAVENOUS | Status: DC
  Administered 2018-08-21 – 2018-08-22 (×3): 2 g via INTRAVENOUS
  Filled 2018-08-21 (×4): qty 2

## 2018-08-21 NOTE — Progress Notes (Signed)
Pharmacy Antibiotic Note  [name] Long is a 52 y.o. male admitted on 08/21/2018 with sepsis.  Pharmacy has been consulted for vanc/zosyn dosing. Patient has been treated already with Zosyn for diverticulitis and has been positive for COVID-19 currently sedated on vent and CRRT as well as pressors. Now 4/23 new resp cx growing citrobacter and Ecoli to change Zosyn to meropenem per CCM 4/24 Meropenen changed to Cefepime  Today, 08/21/2018 - continued Low grade temps - WBC 0.4, dcreased from 1.3 on 4/19 - SCr 2.59, CRRT continues - PCT 3.7 (4/21)  Plan: - per CRRT dosing,Cefepime 2gm q12  Height: 5\' 10"  (177.8 cm) Weight: 279 lb 8.7 oz (126.8 kg) IBW/kg (Calculated) : 73  Temp (24hrs), Avg:99.6 F (37.6 C), Min:98.3 F (36.8 C), Max:100.1 F (37.8 C)  Recent Labs  Lab 08/17/18 0430 08/17/18 1100  08/18/18 0323  08/18/18 2143 08/19/18 0559  08/20/18 0347 08/20/18 1606 08/20/18 2208 08/21/18 0143 08/21/18 0155 08/21/18 0553  WBC 0.8*  --    < > 1.0*  --  0.6* 0.5*  --  0.4*  --   --   --   --  0.4*  CREATININE 2.68*  --    < > 3.52*  3.53*   < >  --  2.96*   < > 2.59* 2.50* 1.80* 2.00* 2.50* 2.59*  LATICACIDVEN 1.4 2.1*  --   --   --   --   --   --   --   --   --   --   --   --    < > = values in this interval not displayed.    Estimated Creatinine Clearance: 44.6 mL/min (A) (by C-G formula based on SCr of 2.59 mg/dL (H)).    No Known Allergies   Antimicrobials this admission: Zosyn 4/2 >> 4/7, then resumed 4/12 >> 4/18, 4/19 >> 4/23 Azithromycin 4/2 >> 4/6 Hydroxychloroquine 4/2 >> 4/6 Vancomycin 4/12 >> 4/13, 4/19 >> 4/23 Meropenem 4/23 >> 4/24 Cefepime 4/24 >>  Dose adjustments this admission: 4/6: Zosyn changed to 2.25g q6h for CRRT 4/21: Zosyn increased to 3.375g q6h for CRRT  Microbiology results: 3/30: COVID +  outpt PTA  4/2 MRSA PCR: negative  4/2 hepatitis panel: neg 4/12 BCx: neg FINAL 4/19 repeat Covid for clearance: positive 4/20 Trach asp:  collected but not received by Micro Lab, CCM aware 4/22 rpt Trach asp: moderate GNR - citrobacter koseri and Ecoli (both sens to Ancef, Cefepime, Ceftazidime, Ceftriaxone)  Thank you for allowing pharmacy to be a part of this patient's care.  Minda Ditto PharmD 08/21/2018, 10:25 AM

## 2018-08-21 NOTE — Progress Notes (Signed)
NAME:  [name] Long, MRN:  [id number], DOB:  1965/07/25, LOS: 11 ADMISSION DATE:  07/30/2018, CONSULTATION DATE:  07/30/2018 REFERRING MD:  07/30/2018, CHIEF COMPLAINT:   Brief History   53 yo male presented to [facility] with several days of abdominal pain from diverticulitis with retained perforation.  Developed pneumonia with hypoxia from [place].  Transferred to [facility]. Actemra not given due to diverticulitis  Past Medical History  DM2, HTN, Former smoker, OSA, A fib  [place] [facility] Events   4/01 admission for diverticulitis 4/02 PCCM consulted for worsening dyspnea, hypoxemia 4/02 Proned  4/03 Proned  4/05 Improved oxygenation however ABG with respiratory and metabolic acidosis. Poor UOP 4/06 Supine; completed plaquenil 4/07 CVVHD, difficulty with filters clotting  4/08 CVVHD ongoing, AFwRVR afternoon of 4/8, started on amio gtt   4/12 hemodialysis catheter changed to right femoral location; brief episode of PEA resolved sponateously 08/11/2018 hemodialysis catheter and filter for CRRT machine or clotting off. 08/11/2018 started on heparin drip for atrial fibrillation in filter clotting on CRRT machine 4/18- Was off sedation transiently 4/17, then had to be started on Precedex. He has tolerated pressure support 8 very well, used PRVC auto mode for the rest of the day yesterday 4/17 Had some agitation requiring increased fentanyl, sedation overnight.  Resultant hypotension and low-dose norepinephrine started.  Now weaned to off.  4/19: had episode of bradycardia, hypotension, hypoxia. Etiology unclear. Started back on pressors and deep sedation.  4/20: X-ray looks worse.  Added HCAP coverage on 4/19, still requiring high PEEP and FiO2 4/21:peep and fio2 down, remains heavily sedated, still on pressors.  Placed new dialysis catheter as femoral had been clotting off 4/22: Weaning off pressors.  Again we will taper back sedation once again.  Still unresponsive from aggressive sedation, did get 1  unit of blood 4/23: Spoke to wife over the phone in regards to goals of care.  Starting to entertain withdrawal.  No significant changes overnight. 4/24: Holding off on escalation of care.  Discussion for withdrawal later when family can no be available Consults:  Renal GI signed off Surgery signed off Procedures:  ETT 4/2 >>  L IJ HD 4/6 >> 4/12 R Rad Aline 4/6 >>  4/12 right femoral HD catheter 24 cm>> Right IJ triple-lumen dialysis catheter 4/21>>> Significant Diagnostic Tests:  CT chest 4/01 [name]) >> b/l GGO in periphery CT abd/pelvis 4/01 [name]) >> perforated diverticulum lower descending colon, fatty liver with ?cirrhosis, splenomegaly 18.4 cm  Micro Data:  COVID 3/30 (PCP office) >> DETECTED Blood 4/12 >> neg COVID at [facility] 08/16/2018 (repeat for clearance) - POSITIVE 4/22 respiratory culture: ecoli and citrobacter (with resistance to zosyn and unasyn) Antimicrobials:  Zosyn 4/1 >> 4/7 Zoysn 4/12 >>4/17 Zosyn 4/19 >4/23 Meropenem 4/34>>>4/24 Cefepime 4/24>>> Vancomycin 4/19 > 4/23  Interim history/subjective:   No changes  Objective   Blood pressure (Abnormal) 120/54, pulse 99, temperature 99.9 F (37.7 C), temperature source Axillary, resp. rate 20, height 5\' 10"  (1.778 m), weight 126.8 kg, SpO2 100 %. CVP:  [10 mmHg-12 mmHg] 10 mmHg  Vent Mode: PRVC FiO2 (%):  [50 %] 50 % Set Rate:  [20 bmp] 20 bmp Vt Set:  [510 mL] 510 mL PEEP:  [10 cmH20] 10 cmH20 Pressure Support:  [10 cmH20] 10 cmH20 Plateau Pressure:  [26 cmH20] 26 cmH20   Intake/Output Summary (Last 24 hours) at 08/21/2018 0934 Last data filed at 08/21/2018 0900 Gross per 24 hour  Intake 4280.97 ml  Output 6692 ml  Net -2411.03 ml  Filed Weights   08/19/18 0500 08/20/18 0426 08/21/18 0500  Weight: 134 kg 132.3 kg 126.8 kg   Physical exam    General: Sedated on full ventilator support.  Critically ill HEENT normocephalic atraumatic sclera edema noted pupils reactive orally intubated  Pulmonary: Diminished throughout Cardiac rate rhythm Abdomen: Soft nontender liquid stools from Flexi-Seal GU: Minimal urine Extremities: Diffuse anasarca cool Neuro: Sedated  Resolved Hospital Problem list   Mixed Acidosis, ARDS from COVID-19 Septic shock resolved 4/22  Assessment & Plan:   Acute hypoxemic respiratory failure due to  COVID-19 infection (repeat test for clearance 4/19 remains positive) complicated by presumed ventilator associated pneumonia and ARDS -No significant change with FiO2 and PEEP requirements Plan Cont ARDS protocol  VAP bundle  Volume removal  Day 2 of appropriate abx coverage (had intermittent sensitivity to zosyn for both ecoli and citrobacter) Changing to cefepime from meropenem   Acute metabolic encephalopathy Plan Cont dilaudid and versed RASS  -2  Atrial fibrillation with RVR: seems to be in sinus now Plan Cont amiodarone  Holding ac due to blood loss  Acute renal failure; now w/ HD access issues as of 4/21 Plan Cont CRRT for now   Anemia critical illness now maybe a component of acute blood loss as well. Some bloody plug in ET suction and melenic stools. Occult blood testing positive.  -hgb transfused 4/21, hemoglobin drifting down again Plan Trend cbc Not a candidate for transfusion given plan to withdraw  Leukopenia in setting of sepsis Plan Trend cbc  DM Plan ssi   MSK/DERM Plan Ostomy nurse recs     Best practice:  Diet: NPO, tube feedings trickle DVT prophylaxis: SCDs GI prophylaxis: pantoprazole Mobility: bed rest Code Status: No CPR or defibrillation but full medical care Family Communication: I updated the wife via telephone today on 4/21 Disposition: Critically ill, not improving.  Discussed situation with wife via phone on 4/23 we have agreed to proceed with withdrawal of care.  They have another funeral he have to attend today on 4/24, we will discuss logistics of withdrawal later today   Erick Colace  ACNP-BC [place] Pager # [phone] OR # [phone] if no answer  08/21/2018 9:34 AM

## 2018-08-21 NOTE — Progress Notes (Signed)
Spoke w/ Butch Penny the wife.  Plan will be to withdraw care on Sunday Family will arrive at 18 to 10 am  Erick Colace ACNP-BC [place] Pager # [phone] OR # [phone] if no answer

## 2018-08-21 NOTE — Progress Notes (Signed)
Nutrition Follow-up  RD working remotely.   DOCUMENTATION CODES:   Morbid obesity  INTERVENTION:  - continue Vital High Protein @ 65 ml/hr with 60 ml prostat BID. - will continue to monitor GOC/POC, medical course   NUTRITION DIAGNOSIS:   Inadequate oral intake related to inability to eat as evidenced by NPO status. -ongoing  GOAL:   Provide needs based on ASPEN/SCCM guidelines -met with TF regimen  MONITOR:   Vent status, TF tolerance, Labs, Weight trends, Skin, I & O's  ASSESSMENT:   53 year-old male admitted on 4/1 with abdominal pain which had been persistent for several days; noted to have diverticulitis at [facility] and was transferred to [facility] for further evaluation. Patient found to be COVID POSITIVE at PCP office earlier in the week. PCCM consulted 4/2 for worsening hypoxemia.   Significant Events: 4/1- admitted 4/2- intubated and OGT placed; proned; triple lumen PICC placed 4/3- proned 4/5- CRRT initiation 4/6- supine 4/9- CRRT stopped d/t persistent filter clotting, inabilitytoresolve issue; trickle rate TF initiated; CRRT re-started 4/10- TF increased to 35 ml/hr with plan to advance by 10 ml every 8 hours to goal of 65 ml/hr 4/11- TF placed on hold duehematochezia 4/12- HD cath moved to R femoral area; brief PEA episode which spontaneously resolved 4/14- HD cath and CRRT filters continuing to clog; started on heparin 4/15- plan to re-start TF at 10 ml/hr 4/17- begin advancing TF 4/20- TF reached goal rate in the early AM 4/21- HD cath moved to R IJ   Weight trending back down. Estimated nutrition needs remain appropriate. Patient remains intubated with OGT in place and is receiving Vital High Protein @ 65 ml/hr with 60 ml prostat BID. This regimen provides 1960 kcal, 196 grams protein, and 1304 ml free water.   Per CCM note this AM: no plan for escalation of care at this time, possible transition to comfort care in the near future,  continues on ARDS protocol, COVID-19 positive as recently as second test done on 4/19, continue on CRRT, acute metabolic encephalopathy, afib with RVR--now in sinus rhythm, anemia of critical illness, leukopenia in the setting of sepsis.    Patient is currently intubated on ventilator support MV: 12.6 L/min as of 11:30 AM Temp (24hrs), Avg:99.2 F (37.3 C), Min:97.7 F (36.5 C), Max:100.1 F (37.8 C) Propofol: none  Medications reviewed; sliding scale novolog, 40 mg IV protonix/day.  Labs reviewed; CBGs: 246, 220, and 245 mg/dl today, Cl: 92 mmol/l, BUN: 45 mg/dl, creatinine: 2.5 mg/dl, ionized Ca: 1.07 mol/L. Drips; amiodarone @ 30 mg/hr, versed @ 2 mg/hr.    Diet Order:   Diet Order            Diet NPO time specified  Diet effective now              EDUCATION NEEDS:   No education needs have been identified at this time  Skin:  Skin Assessment: Skin Integrity Issues: Skin Integrity Issues:: DTI, Stage II, Unstageable DTI: coccyx (4/12) Stage II: bilateral face (4/10) Unstageable: full thickness to cervical spine area (4/15)  Last BM:  4/24  Height:   Ht Readings from Last 1 Encounters:  08/10/18 '5\' 10"'  (1.778 m)    Weight:   Wt Readings from Last 1 Encounters:  08/21/18 126.8 kg    Ideal Body Weight:  75.45 kg  BMI:  Body mass index is 40.11 kg/m.  Estimated Nutritional Needs:   Kcal:  7681-1572 kcal  Protein:  >/= 189 grams  Fluid:  >/= 2.2 L/day      Jarome Matin, MS, RD, LDN, [facility] Inpatient Clinical Dietitian Pager # [phone] After hours/weekend pager # [phone]

## 2018-08-21 NOTE — Progress Notes (Signed)
Patient ID: [name] Long, male   DOB: 02/14/1966, 53 y.o.   MRN: [id number] S: Overall clinical course has been declining per PCCM and will have discussions about withdrawal over the weekend with his wife. O:BP (!) 120/54   Pulse (!) 104   Temp 97.7 F (36.5 C) (Oral)   Resp 18   Ht '5\' 10"'  (1.778 m)   Wt 126.8 kg   SpO2 100%   BMI 40.11 kg/m   Intake/Output Summary (Last 24 hours) at 08/21/2018 1204 Last data filed at 08/21/2018 1100 Gross per 24 hour  Intake 4097.38 ml  Output 6772 ml  Net -2674.62 ml   Intake/Output: I/O last 3 completed shifts: In: 6612.7 [P.O.:170; I.V.:3417.7; Other:10; NG/GT:2540; IV Piggyback:475] Out: 10016 [Other:9016; Stool:1000]  Intake/Output this shift:  Total I/O In: 782.5 [I.V.:362.5; Other:40; NG/GT:380] Out: 1006 [Other:1006] Weight change: -5.5 kg TOI:[id number] and sedated Direct physical contact not performed due to covid-19 infection and relied on discussions with PCCM about physical exam findings.   Recent Labs  Lab 08/17/18 0430  08/18/18 0323  08/18/18 1553  08/19/18 0559  08/19/18 2030  08/20/18 0347  08/20/18 1606  08/21/18 0143 08/21/18 0155 08/21/18 0553 08/21/18 0815 08/21/18 0821 08/21/18 1014 08/21/18 1021  NA 136   < > 136  137   < > 137   < > 137   < > 137   < > 136   < > 136   < > 138 136 138 138 136 137 137  K 4.0   < > 5.0  5.0   < > 4.8   < > 4.3   < > 4.0   < > 4.4   < > 4.3   < > 3.9 4.1 3.9 3.8 4.0 3.7 3.8  CL 100   < > 96*  97*  --  98  --  97*  --  96*  --  94*  --  95*   < > 89* 91* 93* 91* 92* 90* 92*  CO2 25   < > 28  28  --  26  --  27  --  28  --  30  --  30  --   --   --  32  --   --   --   --   GLUCOSE 245*   < > 204*  207*  --  241*  --  231*  --  203*  --  246*  --  287*   < > 328* 291* 238* 265* 246* 277* 258*  BUN 37*   < > 55*  55*  --  61*  --  57*  --  54*  --  52*  --  52*   < > 38* 45* 55* 38* 46* 39* 45*  CREATININE 2.68*   < > 3.52*  3.53*  --  3.52*  --  2.96*  --  2.78*  --  2.59*  --   2.50*   < > 2.00* 2.50* 2.59* 2.10* 2.50* 2.00* 2.50*  ALBUMIN 2.0*  2.0*   < > 1.9*  --  1.8*  --  2.1*  --  1.9*  --  1.7*  --  1.6*  --   --   --  1.6*  --   --   --   --   CALCIUM 6.9*   < > 7.6*  7.6*  --  7.7*  --  8.0*  --  7.7*  --  7.9*  --  7.9*  --   --   --  8.5*  --   --   --   --   PHOS 3.7   < > 5.1*  --  3.3  --  2.4*  --  1.4*  --  1.9*  --  1.9*  --   --   --  2.0*  --   --   --   --   AST 15  --   --   --   --   --   --   --   --   --   --   --   --   --   --   --   --   --   --   --   --   ALT 25  --   --   --   --   --   --   --   --   --   --   --   --   --   --   --   --   --   --   --   --    < > = values in this interval not displayed.   Liver Function Tests: Recent Labs  Lab 08/17/18 0430  08/20/18 0347 08/20/18 1606 08/21/18 0553  AST 15  --   --   --   --   ALT 25  --   --   --   --   ALKPHOS 59  --   --   --   --   BILITOT 1.3*  --   --   --   --   PROT 5.5*  --   --   --   --   ALBUMIN 2.0*  2.0*   < > 1.7* 1.6* 1.6*   < > = values in this interval not displayed.   No results for input(s): LIPASE, AMYLASE in the last 168 hours. No results for input(s): AMMONIA in the last 168 hours. CBC: Recent Labs  Lab 08/18/18 0323  08/18/18 2143  08/19/18 0559  08/20/18 0347  08/21/18 0553  08/21/18 0821 08/21/18 1014 08/21/18 1021  WBC 1.0*  --  0.6*  --  0.5*  --  0.4*  --  0.4*  --   --   --   --   HGB 7.0*   < > 6.6*   < > 7.6*   < > 7.0*   < > 7.0*   < > 7.8* 8.2* 7.8*  HCT 23.2*   < > 21.6*   < > 24.3*   < > 23.2*   < > 23.5*   < > 23.0* 24.0* 23.0*  MCV 94.7  --  96.4  --  94.2  --  93.9  --  94.8  --   --   --   --   PLT 165  --  129*  --  111*  --  97*  --  104*  --   --   --   --    < > = values in this interval not displayed.   Cardiac Enzymes: No results for input(s): CKTOTAL, CKMB, CKMBINDEX, TROPONINI in the last 168 hours. CBG: Recent Labs  Lab 08/20/18 1606 08/20/18 2008 08/20/18 2328 08/21/18 0405 08/21/18 0743  GLUCAP 270*  202* 230* 246* 220*    Iron Studies: No results for input(s): IRON, TIBC, TRANSFERRIN, FERRITIN in the last 72 hours. Studies/Results: No results  found. . chlorhexidine gluconate (MEDLINE KIT)  15 mL Mouth Rinse BID  . Chlorhexidine Gluconate Cloth  6 each Topical Daily  . feeding supplement (PRO-STAT SUGAR FREE 64)  60 mL Per Tube BID  . heparin injection (subcutaneous)  5,000 Units Subcutaneous Q12H  . insulin aspart  0-9 Units Subcutaneous Q4H  . mouth rinse  15 mL Mouth Rinse 10 times per day  . pantoprazole (PROTONIX) IV  40 mg Intravenous Q24H  . sodium chloride flush  10-40 mL Intracatheter Q12H    BMET    Component Value Date/Time   NA 137 08/21/2018 1021   K 3.8 08/21/2018 1021   CL 92 (L) 08/21/2018 1021   CO2 32 08/21/2018 0553   GLUCOSE 258 (H) 08/21/2018 1021   BUN 45 (H) 08/21/2018 1021   CREATININE 2.50 (H) 08/21/2018 1021   CALCIUM 8.5 (L) 08/21/2018 0553   GFRNONAA 27 (L) 08/21/2018 0553   GFRAA 32 (L) 08/21/2018 0553   CBC    Component Value Date/Time   WBC 0.4 (LL) 08/21/2018 0553   RBC 2.48 (L) 08/21/2018 0553   HGB 7.8 (L) 08/21/2018 1021   HCT 23.0 (L) 08/21/2018 1021   PLT 104 (L) 08/21/2018 0553   MCV 94.8 08/21/2018 0553   MCH 28.2 08/21/2018 0553   MCHC 29.8 (L) 08/21/2018 0553   RDW 19.0 (H) 08/21/2018 0553   LYMPHSABS 0.6 (L) 08/13/2018 0420   MONOABS 0.4 08/13/2018 0420   EOSABS 0.1 08/13/2018 0420   BASOSABS 0.0 08/13/2018 0420    Assessment/Plan:  1. ARF in setting of coronavirus sepsis. Presumably due to ischemic ATN although unclear if there is direct viral injury with covid-19. He has been receiving CVVHD since 08/02/18 in setting of worsening respiratory failure and metabolic acidosis.  1. HD catheter was changed to right fem site on 08/09/18 andagainon4/21/20 due to poor function and frequent clotting. 2. Tolerating UF with CVVHD. 3. Wean vasopressin as able 2. Coronavirus PNA with sepsis and VDRF- intubated since 08/02/18.   3. ARDS- concern for possible HCAP on top of covid-19. Now on zosyn and vancomycin per PCCM. Will likely require prone ventilation again. 4. Septic shock- vasopressin added due to Afib with RVR related to levophed. Per PCCM added HCAP coverage started 4/19 5. ABLA- off of heparin.  6. Hypophosphatemia- replete as needed 7. Disposition- poor overall prognosis.  Agree with starting discussion with family about withdrawing care and transitioning to comfort measures. 8. Perforated diverticulum  Donetta Potts, MD [facility] [phone]

## 2018-08-22 DIAGNOSIS — R579 Shock, unspecified: Secondary | ICD-10-CM

## 2018-08-22 DIAGNOSIS — Z9911 Dependence on respirator [ventilator] status: Secondary | ICD-10-CM

## 2018-08-22 LAB — RENAL FUNCTION PANEL
Albumin: 1.6 g/dL — ABNORMAL LOW (ref 3.5–5.0)
Anion gap: 13 (ref 5–15)
BUN: 50 mg/dL — ABNORMAL HIGH (ref 6–20)
CO2: 36 mmol/L — ABNORMAL HIGH (ref 22–32)
Calcium: 8.6 mg/dL — ABNORMAL LOW (ref 8.9–10.3)
Chloride: 89 mmol/L — ABNORMAL LOW (ref 98–111)
Creatinine, Ser: 2.27 mg/dL — ABNORMAL HIGH (ref 0.61–1.24)
GFR calc Af Amer: 37 mL/min — ABNORMAL LOW (ref >60)
GFR calc non Af Amer: 32 mL/min — ABNORMAL LOW (ref >60)
Glucose, Bld: 276 mg/dL — ABNORMAL HIGH (ref 70–99)
Phosphorus: 2.6 mg/dL (ref 2.5–4.6)
Potassium: 3.8 mmol/L (ref 3.5–5.1)
Sodium: 138 mmol/L (ref 135–145)

## 2018-08-22 LAB — POCT I-STAT, CHEM 8
BUN: 36 mg/dL — ABNORMAL HIGH (ref 6–20)
BUN: 37 mg/dL — ABNORMAL HIGH (ref 6–20)
BUN: 37 mg/dL — ABNORMAL HIGH (ref 6–20)
BUN: 37 mg/dL — ABNORMAL HIGH (ref 6–20)
BUN: 37 mg/dL — ABNORMAL HIGH (ref 6–20)
BUN: 38 mg/dL — ABNORMAL HIGH (ref 6–20)
BUN: 39 mg/dL — ABNORMAL HIGH (ref 6–20)
BUN: 41 mg/dL — ABNORMAL HIGH (ref 6–20)
BUN: 42 mg/dL — ABNORMAL HIGH (ref 6–20)
BUN: 43 mg/dL — ABNORMAL HIGH (ref 6–20)
BUN: 43 mg/dL — ABNORMAL HIGH (ref 6–20)
BUN: 44 mg/dL — ABNORMAL HIGH (ref 6–20)
BUN: 46 mg/dL — ABNORMAL HIGH (ref 6–20)
BUN: 46 mg/dL — ABNORMAL HIGH (ref 6–20)
BUN: 57 mg/dL — ABNORMAL HIGH (ref 6–20)
BUN: 37 mg/dL — ABNORMAL HIGH (ref 6–20)
Calcium, Ion: 0.41 mmol/L — CL (ref 1.15–1.40)
Calcium, Ion: 0.46 mmol/L — CL (ref 1.15–1.40)
Calcium, Ion: 0.47 mmol/L — CL (ref 1.15–1.40)
Calcium, Ion: 0.47 mmol/L — CL (ref 1.15–1.40)
Calcium, Ion: 0.48 mmol/L — CL (ref 1.15–1.40)
Calcium, Ion: 0.51 mmol/L — CL (ref 1.15–1.40)
Calcium, Ion: 0.52 mmol/L — CL (ref 1.15–1.40)
Calcium, Ion: 0.54 mmol/L — CL (ref 1.15–1.40)
Calcium, Ion: 1.05 mmol/L — ABNORMAL LOW (ref 1.15–1.40)
Calcium, Ion: 1.05 mmol/L — ABNORMAL LOW (ref 1.15–1.40)
Calcium, Ion: 1.06 mmol/L — ABNORMAL LOW (ref 1.15–1.40)
Calcium, Ion: 1.07 mmol/L — ABNORMAL LOW (ref 1.15–1.40)
Calcium, Ion: 1.07 mmol/L — ABNORMAL LOW (ref 1.15–1.40)
Calcium, Ion: 1.1 mmol/L — ABNORMAL LOW (ref 1.15–1.40)
Calcium, Ion: 1.17 mmol/L (ref 1.15–1.40)
Calcium, Ion: 0.43 mmol/L — CL (ref 1.15–1.40)
Chloride: 87 mmol/L — ABNORMAL LOW (ref 98–111)
Chloride: 87 mmol/L — ABNORMAL LOW (ref 98–111)
Chloride: 88 mmol/L — ABNORMAL LOW (ref 98–111)
Chloride: 88 mmol/L — ABNORMAL LOW (ref 98–111)
Chloride: 88 mmol/L — ABNORMAL LOW (ref 98–111)
Chloride: 88 mmol/L — ABNORMAL LOW (ref 98–111)
Chloride: 88 mmol/L — ABNORMAL LOW (ref 98–111)
Chloride: 89 mmol/L — ABNORMAL LOW (ref 98–111)
Chloride: 89 mmol/L — ABNORMAL LOW (ref 98–111)
Chloride: 89 mmol/L — ABNORMAL LOW (ref 98–111)
Chloride: 89 mmol/L — ABNORMAL LOW (ref 98–111)
Chloride: 89 mmol/L — ABNORMAL LOW (ref 98–111)
Chloride: 90 mmol/L — ABNORMAL LOW (ref 98–111)
Chloride: 90 mmol/L — ABNORMAL LOW (ref 98–111)
Chloride: 91 mmol/L — ABNORMAL LOW (ref 98–111)
Chloride: 85 mmol/L — ABNORMAL LOW (ref 98–111)
Creatinine, Ser: 1.9 mg/dL — ABNORMAL HIGH (ref 0.61–1.24)
Creatinine, Ser: 1.9 mg/dL — ABNORMAL HIGH (ref 0.61–1.24)
Creatinine, Ser: 1.9 mg/dL — ABNORMAL HIGH (ref 0.61–1.24)
Creatinine, Ser: 1.9 mg/dL — ABNORMAL HIGH (ref 0.61–1.24)
Creatinine, Ser: 1.9 mg/dL — ABNORMAL HIGH (ref 0.61–1.24)
Creatinine, Ser: 1.9 mg/dL — ABNORMAL HIGH (ref 0.61–1.24)
Creatinine, Ser: 1.9 mg/dL — ABNORMAL HIGH (ref 0.61–1.24)
Creatinine, Ser: 1.9 mg/dL — ABNORMAL HIGH (ref 0.61–1.24)
Creatinine, Ser: 2.2 mg/dL — ABNORMAL HIGH (ref 0.61–1.24)
Creatinine, Ser: 2.2 mg/dL — ABNORMAL HIGH (ref 0.61–1.24)
Creatinine, Ser: 2.2 mg/dL — ABNORMAL HIGH (ref 0.61–1.24)
Creatinine, Ser: 2.3 mg/dL — ABNORMAL HIGH (ref 0.61–1.24)
Creatinine, Ser: 2.4 mg/dL — ABNORMAL HIGH (ref 0.61–1.24)
Creatinine, Ser: 2.4 mg/dL — ABNORMAL HIGH (ref 0.61–1.24)
Creatinine, Ser: 2.4 mg/dL — ABNORMAL HIGH (ref 0.61–1.24)
Creatinine, Ser: 1.9 mg/dL — ABNORMAL HIGH (ref 0.61–1.24)
Glucose, Bld: 275 mg/dL — ABNORMAL HIGH (ref 70–99)
Glucose, Bld: 287 mg/dL — ABNORMAL HIGH (ref 70–99)
Glucose, Bld: 293 mg/dL — ABNORMAL HIGH (ref 70–99)
Glucose, Bld: 295 mg/dL — ABNORMAL HIGH (ref 70–99)
Glucose, Bld: 296 mg/dL — ABNORMAL HIGH (ref 70–99)
Glucose, Bld: 303 mg/dL — ABNORMAL HIGH (ref 70–99)
Glucose, Bld: 305 mg/dL — ABNORMAL HIGH (ref 70–99)
Glucose, Bld: 309 mg/dL — ABNORMAL HIGH (ref 70–99)
Glucose, Bld: 315 mg/dL — ABNORMAL HIGH (ref 70–99)
Glucose, Bld: 316 mg/dL — ABNORMAL HIGH (ref 70–99)
Glucose, Bld: 319 mg/dL — ABNORMAL HIGH (ref 70–99)
Glucose, Bld: 327 mg/dL — ABNORMAL HIGH (ref 70–99)
Glucose, Bld: 334 mg/dL — ABNORMAL HIGH (ref 70–99)
Glucose, Bld: 334 mg/dL — ABNORMAL HIGH (ref 70–99)
Glucose, Bld: 342 mg/dL — ABNORMAL HIGH (ref 70–99)
Glucose, Bld: 360 mg/dL — ABNORMAL HIGH (ref 70–99)
HCT: 22 % — ABNORMAL LOW (ref 39.0–52.0)
HCT: 22 % — ABNORMAL LOW (ref 39.0–52.0)
HCT: 22 % — ABNORMAL LOW (ref 39.0–52.0)
HCT: 23 % — ABNORMAL LOW (ref 39.0–52.0)
HCT: 23 % — ABNORMAL LOW (ref 39.0–52.0)
HCT: 23 % — ABNORMAL LOW (ref 39.0–52.0)
HCT: 24 % — ABNORMAL LOW (ref 39.0–52.0)
HCT: 24 % — ABNORMAL LOW (ref 39.0–52.0)
HCT: 24 % — ABNORMAL LOW (ref 39.0–52.0)
HCT: 24 % — ABNORMAL LOW (ref 39.0–52.0)
HCT: 25 % — ABNORMAL LOW (ref 39.0–52.0)
HCT: 25 % — ABNORMAL LOW (ref 39.0–52.0)
HCT: 25 % — ABNORMAL LOW (ref 39.0–52.0)
HCT: 25 % — ABNORMAL LOW (ref 39.0–52.0)
HCT: 37 % — ABNORMAL LOW (ref 39.0–52.0)
HCT: 23 % — ABNORMAL LOW (ref 39.0–52.0)
Hemoglobin: 12.6 g/dL — ABNORMAL LOW (ref 13.0–17.0)
Hemoglobin: 7.5 g/dL — ABNORMAL LOW (ref 13.0–17.0)
Hemoglobin: 7.5 g/dL — ABNORMAL LOW (ref 13.0–17.0)
Hemoglobin: 7.5 g/dL — ABNORMAL LOW (ref 13.0–17.0)
Hemoglobin: 7.8 g/dL — ABNORMAL LOW (ref 13.0–17.0)
Hemoglobin: 7.8 g/dL — ABNORMAL LOW (ref 13.0–17.0)
Hemoglobin: 7.8 g/dL — ABNORMAL LOW (ref 13.0–17.0)
Hemoglobin: 8.2 g/dL — ABNORMAL LOW (ref 13.0–17.0)
Hemoglobin: 8.2 g/dL — ABNORMAL LOW (ref 13.0–17.0)
Hemoglobin: 8.2 g/dL — ABNORMAL LOW (ref 13.0–17.0)
Hemoglobin: 8.2 g/dL — ABNORMAL LOW (ref 13.0–17.0)
Hemoglobin: 8.5 g/dL — ABNORMAL LOW (ref 13.0–17.0)
Hemoglobin: 8.5 g/dL — ABNORMAL LOW (ref 13.0–17.0)
Hemoglobin: 8.5 g/dL — ABNORMAL LOW (ref 13.0–17.0)
Hemoglobin: 8.5 g/dL — ABNORMAL LOW (ref 13.0–17.0)
Hemoglobin: 7.8 g/dL — ABNORMAL LOW (ref 13.0–17.0)
Potassium: 3.7 mmol/L (ref 3.5–5.1)
Potassium: 3.7 mmol/L (ref 3.5–5.1)
Potassium: 3.7 mmol/L (ref 3.5–5.1)
Potassium: 3.7 mmol/L (ref 3.5–5.1)
Potassium: 3.7 mmol/L (ref 3.5–5.1)
Potassium: 3.7 mmol/L (ref 3.5–5.1)
Potassium: 3.8 mmol/L (ref 3.5–5.1)
Potassium: 3.8 mmol/L (ref 3.5–5.1)
Potassium: 3.8 mmol/L (ref 3.5–5.1)
Potassium: 3.8 mmol/L (ref 3.5–5.1)
Potassium: 3.8 mmol/L (ref 3.5–5.1)
Potassium: 3.8 mmol/L (ref 3.5–5.1)
Potassium: 3.8 mmol/L (ref 3.5–5.1)
Potassium: 3.9 mmol/L (ref 3.5–5.1)
Potassium: 4.1 mmol/L (ref 3.5–5.1)
Potassium: 3.6 mmol/L (ref 3.5–5.1)
Sodium: 137 mmol/L (ref 135–145)
Sodium: 137 mmol/L (ref 135–145)
Sodium: 137 mmol/L (ref 135–145)
Sodium: 137 mmol/L (ref 135–145)
Sodium: 137 mmol/L (ref 135–145)
Sodium: 137 mmol/L (ref 135–145)
Sodium: 137 mmol/L (ref 135–145)
Sodium: 138 mmol/L (ref 135–145)
Sodium: 138 mmol/L (ref 135–145)
Sodium: 138 mmol/L (ref 135–145)
Sodium: 138 mmol/L (ref 135–145)
Sodium: 138 mmol/L (ref 135–145)
Sodium: 138 mmol/L (ref 135–145)
Sodium: 138 mmol/L (ref 135–145)
Sodium: 139 mmol/L (ref 135–145)
Sodium: 138 mmol/L (ref 135–145)
TCO2: 32 mmol/L (ref 22–32)
TCO2: 33 mmol/L — ABNORMAL HIGH (ref 22–32)
TCO2: 33 mmol/L — ABNORMAL HIGH (ref 22–32)
TCO2: 33 mmol/L — ABNORMAL HIGH (ref 22–32)
TCO2: 33 mmol/L — ABNORMAL HIGH (ref 22–32)
TCO2: 33 mmol/L — ABNORMAL HIGH (ref 22–32)
TCO2: 34 mmol/L — ABNORMAL HIGH (ref 22–32)
TCO2: 34 mmol/L — ABNORMAL HIGH (ref 22–32)
TCO2: 35 mmol/L — ABNORMAL HIGH (ref 22–32)
TCO2: 35 mmol/L — ABNORMAL HIGH (ref 22–32)
TCO2: 36 mmol/L — ABNORMAL HIGH (ref 22–32)
TCO2: 36 mmol/L — ABNORMAL HIGH (ref 22–32)
TCO2: 37 mmol/L — ABNORMAL HIGH (ref 22–32)
TCO2: 38 mmol/L — ABNORMAL HIGH (ref 22–32)
TCO2: 39 mmol/L — ABNORMAL HIGH (ref 22–32)
TCO2: 33 mmol/L — ABNORMAL HIGH (ref 22–32)

## 2018-08-22 LAB — POCT I-STAT 7, (LYTES, BLD GAS, ICA,H+H)
Acid-Base Excess: 10 mmol/L — ABNORMAL HIGH (ref 0.0–2.0)
Acid-Base Excess: 6 mmol/L — ABNORMAL HIGH (ref 0.0–2.0)
Bicarbonate: 33.1 mmol/L — ABNORMAL HIGH (ref 20.0–28.0)
Bicarbonate: 36.7 mmol/L — ABNORMAL HIGH (ref 20.0–28.0)
Calcium, Ion: 0.5 mmol/L — CL (ref 1.15–1.40)
Calcium, Ion: 1.08 mmol/L — ABNORMAL LOW (ref 1.15–1.40)
HCT: 23 % — ABNORMAL LOW (ref 39.0–52.0)
HCT: 24 % — ABNORMAL LOW (ref 39.0–52.0)
Hemoglobin: 7.8 g/dL — ABNORMAL LOW (ref 13.0–17.0)
Hemoglobin: 8.2 g/dL — ABNORMAL LOW (ref 13.0–17.0)
O2 Saturation: 59 %
O2 Saturation: 98 %
Potassium: 3.8 mmol/L (ref 3.5–5.1)
Potassium: 3.9 mmol/L (ref 3.5–5.1)
Sodium: 137 mmol/L (ref 135–145)
Sodium: 138 mmol/L (ref 135–145)
TCO2: 35 mmol/L — ABNORMAL HIGH (ref 22–32)
TCO2: 39 mmol/L — ABNORMAL HIGH (ref 22–32)
pCO2 arterial: 60.9 mmHg — ABNORMAL HIGH (ref 32.0–48.0)
pCO2 arterial: 64.2 mmHg — ABNORMAL HIGH (ref 32.0–48.0)
pH, Arterial: 7.32 — ABNORMAL LOW (ref 7.350–7.450)
pH, Arterial: 7.388 (ref 7.350–7.450)
pO2, Arterial: 115 mmHg — ABNORMAL HIGH (ref 83.0–108.0)
pO2, Arterial: 34 mmHg — CL (ref 83.0–108.0)

## 2018-08-22 LAB — GLUCOSE, CAPILLARY
Glucose-Capillary: 267 mg/dL — ABNORMAL HIGH (ref 70–99)
Glucose-Capillary: 243 mg/dL — ABNORMAL HIGH (ref 70–99)
Glucose-Capillary: 282 mg/dL — ABNORMAL HIGH (ref 70–99)
Glucose-Capillary: 310 mg/dL — ABNORMAL HIGH (ref 70–99)
Glucose-Capillary: 207 mg/dL — ABNORMAL HIGH (ref 70–99)

## 2018-08-22 LAB — CBC
HCT: 24 % — ABNORMAL LOW (ref 39.0–52.0)
Hemoglobin: 7.1 g/dL — ABNORMAL LOW (ref 13.0–17.0)
MCH: 28.5 pg (ref 26.0–34.0)
MCHC: 29.6 g/dL — ABNORMAL LOW (ref 30.0–36.0)
MCV: 96.4 fL (ref 80.0–100.0)
Platelets: 93 10*3/uL — ABNORMAL LOW (ref 150–400)
RBC: 2.49 MIL/uL — ABNORMAL LOW (ref 4.22–5.81)
RDW: 19.3 % — ABNORMAL HIGH (ref 11.5–15.5)
WBC: 0.3 10*3/uL — CL (ref 4.0–10.5)
nRBC: 0 % (ref 0.0–0.2)

## 2018-08-22 LAB — APTT: aPTT: 27 seconds (ref 24–36)

## 2018-08-22 LAB — CALCIUM, IONIZED
Calcium, Ionized, Serum: 4.4 mg/dL — ABNORMAL LOW (ref 4.5–5.6)
Calcium, Ionized, Serum: 4.8 mg/dL (ref 4.5–5.6)

## 2018-08-22 LAB — MAGNESIUM: Magnesium: 2 mg/dL (ref 1.7–2.4)

## 2018-08-22 MED ORDER — GLYCOPYRROLATE 1 MG PO TABS: 1.0000 mg | ORAL_TABLET | ORAL | Status: DC | PRN

## 2018-08-22 MED ORDER — POLYVINYL ALCOHOL 1.4 % OP SOLN
1.0000 [drp] | Freq: Four times a day (QID) | OPHTHALMIC | Status: DC | PRN
  Filled 2018-08-22: qty 15

## 2018-08-22 MED ORDER — GLYCOPYRROLATE 0.2 MG/ML IJ SOLN: 0.2000 mg | INTRAMUSCULAR | Status: DC | PRN

## 2018-08-22 MED ORDER — MORPHINE SULFATE (PF) 2 MG/ML IV SOLN: 2.0000 mg | INTRAVENOUS | Status: DC | PRN

## 2018-08-22 MED ORDER — DIPHENHYDRAMINE HCL 50 MG/ML IJ SOLN: 25.0000 mg | INTRAMUSCULAR | Status: DC | PRN

## 2018-08-22 MED ORDER — ACETAMINOPHEN 650 MG RE SUPP: 650.0000 mg | Freq: Four times a day (QID) | RECTAL | Status: DC | PRN

## 2018-08-22 MED ORDER — ACETAMINOPHEN 325 MG PO TABS: 650.0000 mg | ORAL_TABLET | Freq: Four times a day (QID) | ORAL | Status: DC | PRN

## 2018-08-22 MED ORDER — MIDAZOLAM HCL 2 MG/2ML IJ SOLN: 2.0000 mg | INTRAMUSCULAR | Status: DC | PRN

## 2018-08-22 MED ORDER — DEXTROSE 5 % IV SOLN: INTRAVENOUS | Status: DC

## 2018-08-22 MED ORDER — HEPARIN SODIUM (PORCINE) 1000 UNIT/ML IJ SOLN
1000.0000 [IU] | Freq: Once | INTRAMUSCULAR | Status: AC
  Administered 2018-08-22: 4000 [IU] via INTRAVENOUS
  Filled 2018-08-22: qty 6

## 2018-08-23 LAB — CALCIUM, IONIZED: Calcium, Ionized, Serum: 4.6 mg/dL (ref 4.5–5.6)

## 2018-08-28 NOTE — Death Summary Note (Signed)
Pt was prepared to remove tube, per wife Butch Penny, after speaking to MD. BP dropped quickly. Sedation stopped. Phone was brought to the room and placed near pt's ear. Family said their goodbyes. Patient passed away. Emotional support given. MD notified.

## 2018-08-28 NOTE — Death Summary Note (Signed)
DEATH SUMMARY   Patient Details  Name: [name] Long MRN: [id number] DOB: 1965/06/22  Admission/Discharge Information   Admit Date:  08/15/18  Date of Death: Date of Death: 09/08/18  Time of Death: Time of Death: Aug 04, 1813  Length of Stay: 08/07/22  Referring Physician: Patient, No Pcp Per   Reason(s) for Hospitalization  Admitted for respiratory failure secondary to viral covid 19 pneumonia'  Diagnoses  Preliminary cause of death: COVID-19 virus infection Secondary Diagnoses (including complications and co-morbidities):  Principal Problem:   ARDS (adult respiratory distress syndrome) ([place]) Active Problems:   COVID-19 virus infection   Diverticulitis of colon with perforation   HTN (hypertension)   DM2 (diabetes mellitus, type 2) (H[facility])   Neutropenia ([place])   Thrombocytopenia ([place])   Pressure injury of skin   Respiratory failure ([place])   Acute renal failure [facility])   Brief Hospital Course (including significant findings, care, treatment, and services provided and events leading to death)  [name] Long is a 53 y.o. year old male who presented from [facility] with several days of abdominal pain from diverticulitis with retained perforation.  Developed pneumonia with hypoxia from [place].  Transferred to [facility]. Actemra not given due to diverticulitis  Aug 15, 2022 admission for diverticulitis 4/02 PCCM consulted for worsening dyspnea, hypoxemia 4/02 Proned  4/03 Proned  4/05 Improved oxygenation however ABG with respiratory and metabolic acidosis. Poor UOP 4/06 Supine; completed plaquenil 4/07 CVVHD, difficulty with filters clotting  4/08 CVVHD ongoing, AFwRVR afternoon of 4/8, started on amio gtt   4/12 hemodialysis catheter changed to right femoral location; brief episode of PEA resolved sponateously 08/11/2018 hemodialysis catheter and filter for CRRT machine or clotting off. 08/11/2018 started on heparin drip for atrial fibrillation in filter clotting on CRRT machine 4/18- Was off sedation transiently  4/17, then had to be started on Precedex. He has tolerated pressure support 8 very well, used PRVC auto mode for the rest of the day yesterday 4/17 Had some agitation requiring increased fentanyl, sedation overnight.  Resultant hypotension and low-dose norepinephrine started.  Now weaned to off.  4/19: had episode of bradycardia, hypotension, hypoxia. Etiology unclear. Started back on pressors and deep sedation.  4/20: X-ray looks worse.  Added HCAP coverage on 4/19, still requiring high PEEP and FiO2 4/21:peep and fio2 down, remains heavily sedated, still on pressors.  Placed new dialysis catheter as femoral had been clotting off 4/22: Weaning off pressors.  Again we will taper back sedation once again.  Still unresponsive from aggressive sedation, did get 1 unit of blood 4/23: Spoke to wife over the phone in regards to goals of care.  Starting to entertain withdrawal.  No significant changes overnight. 4/24: Holding off on escalation of care.  Discussion for withdrawal later when family can no be available  09/08/22 patient continued to have decline, hypotensive and diffculty oxygenating. The decision was made to withdraw care. The wife was able to speak to the patient via the phone. The patient passed almost immediately in the ICU. Family was grateful for their care.    Pertinent Labs and Studies  Significant Diagnostic Studies Dg Abd 1 View  Result Date: 08/10/2018 CLINICAL DATA:  Impaction of bowels. COVID-19. EXAM: ABDOMEN - 1 VIEW COMPARISON:  08/08/2018 FINDINGS: Nasogastric tube tip overlies the gastric antrum. Right femoral catheter remains in place. The bowel gas pattern is normal. No evidence of dilated bowel loops. IMPRESSION: Normal bowel gas pattern. Nasogastric tube in appropriate position. Electronically Signed   By: Earle Gell M.D.   On: 08/10/2018  08:34   Dg Abd 1 View  Result Date: 08/08/2018 CLINICAL DATA:  Abdominal distention. H/o Diabetes Type 2. Pt is Covid-19 Positive  EXAM: ABDOMEN - 1 VIEW COMPARISON:  None. FINDINGS: Enteric tube appears adequately positioned in the stomach. Paucity of bowel gas. No dilated large or small bowel loops seen. No evidence of free intraperitoneal air seen. No evidence of renal or ureteral calculi. Osseous structures are unremarkable. IMPRESSION: 1. Enteric tube adequately positioned in the stomach. 2. Paucity of bowel gas. No dilated large or small bowel loops seen. Electronically Signed   By: Franki Cabot M.D.   On: 08/08/2018 19:26   Dg Chest Port 1 View  Result Date: 08/19/2018 CLINICAL DATA:  53 year old male with pneumonia, sepsis, positive COVID-19. EXAM: PORTABLE CHEST 1 VIEW COMPARISON:  08/18/2018 and earlier. FINDINGS: Portable AP semi upright view at 0435 hours. Stable endotracheal tube tip at the level the clavicles. Enteric tube courses to the abdomen, tip not included. Stable right IJ central line. The patient is more rotated to the right. Stable cardiomegaly and mediastinal contours. Stable somewhat low lung volumes. Patchy in coarse bilateral pulmonary opacity has not significantly changed since 08/17/2018. No pneumothorax or pleural effusion. Paucity of bowel gas in the upper abdomen. IMPRESSION: 1. Stable lines and tubes. 2. Patchy bilateral pulmonary opacity due to COVID-19 pneumonia not significantly changed since 08/17/18. Electronically Signed   By: Genevie Ann M.D.   On: 08/19/2018 06:57   Dg Chest Port 1 View  Result Date: 08/18/2018 CLINICAL DATA:  Central line placement EXAM: PORTABLE CHEST 1 VIEW COMPARISON:  08/18/2018 FINDINGS: Endotracheal tube with the tip 4.4 cm above the carina. Nasogastric tube coursing below the diaphragm. Right jugular central venous catheter with the tip projecting over the SVC. Bilateral interstitial and alveolar airspace opacities diffusely throughout the lungs. No pleural effusion or pneumothorax. Stable cardiomegaly. No acute osseous abnormality. IMPRESSION: 1. Support lines and tubing  in satisfactory position. 2. Bilateral interstitial and alveolar airspace opacities which may reflect multilobar pneumonia versus pulmonary edema. Electronically Signed   By: Kathreen Devoid   On: 08/18/2018 10:06   Dg Chest Port 1 View  Result Date: 08/18/2018 CLINICAL DATA:  53 year old male with COVID-19. Respiratory failure. EXAM: PORTABLE CHEST 1 VIEW COMPARISON:  08/17/2018 and earlier. FINDINGS: Portable AP semi upright view at 0442 hours. Stable endotracheal tube and enteric tube. Coarse interstitial and patchy bilateral pulmonary opacity appears mildly regressed since yesterday, in addition to larger lung volumes. Opacity remains increased compared to 08/16/2018. No pneumothorax or pleural effusion. Paucity bowel gas in the upper abdomen. IMPRESSION: 1.  Stable lines and tubes. 2. Mildly larger lung volumes and improved bilateral ventilation since yesterday with continued widespread coarse and patchy pulmonary opacity. Electronically Signed   By: Genevie Ann M.D.   On: 08/18/2018 06:59   Dg Chest Port 1 View  Result Date: 08/17/2018 CLINICAL DATA:  53 year old male positive COVID-19. EXAM: PORTABLE CHEST 1 VIEW COMPARISON:  08/16/2018 and earlier. FINDINGS: Portable AP semi upright view at 0349 hours. Endotracheal tube tip at the level the clavicles. Enteric tube courses to the abdomen, tip not included. Worsening bilateral indistinct pulmonary opacity since 08/10/2018 with peripheral and lower lung predominance. Mediastinal contours appear stable. No associated pneumothorax. No pleural effusion is evident. Paucity of bowel gas in the upper abdomen. IMPRESSION: 1.  Stable lines and tubes. 2. Progressed bilateral indistinct pulmonary opacity suggesting progressed COVID-19 respiratory disease. Electronically Signed   By: Genevie Ann M.D.   On: 08/17/2018  06:26   Dg Chest Port 1 View  Result Date: 08/16/2018 CLINICAL DATA:  Per order- acute respiratory failure PT HX: DM, ex smoker, HTN EXAM: PORTABLE CHEST 1  VIEW COMPARISON:  Radiograph 08/12/2018 FINDINGS: Endotracheal tube and NG tube unchanged. Stable moderately large cardiac silhouette. Mild diffuse pulmonary parenchymal opacities unchanged from comparison exam. IMPRESSION: 1. Stable support apparatus. 2. No interval change. 3. Diffuse mild pulmonary parenchymal opacities again noted. Electronically Signed   By: Suzy Bouchard M.D.   On: 08/16/2018 06:10   Dg Chest Port 1 View  Result Date: 08/12/2018 CLINICAL DATA:  Acute respiratory failure. Endotracheally intubated. COVID-19. EXAM: PORTABLE CHEST 1 VIEW COMPARISON:  08/10/2018 FINDINGS: Endotracheal tube and nasogastric tube remain in appropriate position. Stable cardiomegaly. There has been interval improvement in mild bibasilar infiltrates since previous study. No evidence of pleural effusion. IMPRESSION: 1. Interval improvement in mild bibasilar infiltrates. 2. Stable cardiomegaly. Electronically Signed   By: Earle Gell M.D.   On: 08/12/2018 07:35   Dg Chest Port 1 View  Result Date: 08/10/2018 CLINICAL DATA:  COVID-19. Respiratory failure. Endotracheal tube present. EXAM: PORTABLE CHEST 1 VIEW COMPARISON:  08/07/2018 FINDINGS: Endotracheal tube and nasogastric tube remain in appropriate position. Heart size is stable. Low lung volumes are again seen. Bibasilar pulmonary infiltrates show no significant change. No pleural effusion visualized. IMPRESSION: 1. Stable low lung volumes and bibasilar pulmonary infiltrates. 2. Endotracheal tube and nasogastric tube remain in appropriate position. Electronically Signed   By: Earle Gell M.D.   On: 08/10/2018 08:33   Dg Chest Port 1 View  Result Date: 08/07/2018 CLINICAL DATA:  Respiratory distress. EXAM: PORTABLE CHEST 1 VIEW COMPARISON:  Radiograph 08/02/2018, CT 07/30/2018 FINDINGS: Endotracheal tube is not well visualized, tip tentatively identified above the thoracic inlet. Left internal jugular central line tip projects over the distal  brachiocephalic vein. Left upper extremity PICC tip projects over the brachiocephalic/SVC confluence. Cardiomegaly is unchanged. Worsening patchy opacity at the right lung base. Unchanged retrocardiac opacity with improved aeration of the left mid lung. No pneumothorax. IMPRESSION: 1. Worsening patchy opacity at the right lung base. 2. Unchanged retrocardiac opacity and cardiomegaly. 3. Endotracheal tube not well seen, tip possibly above the thoracic inlet. Electronically Signed   By: Keith Rake M.D.   On: 08/07/2018 03:30   Dg Chest Port 1 View  Result Date: 08/02/2018 CLINICAL DATA:  Central line placement. EXAM: PORTABLE CHEST 1 VIEW COMPARISON:  07/30/2018 FINDINGS: Left internal jugular central line tip is in the SVC at the azygos level. Endotracheal tube tip 4 cm above the carina. Orogastric or nasogastric tube enters the abdomen. Right lung appears improved with less edema. There is persistent and possibly worsened pneumonia on the left, particularly in the lower lobe. IMPRESSION: Left internal jugular central line tip in the SVC at the azygos level. No pneumothorax. Endotracheal tube and nasogastric or orogastric tube satisfactory. Improved aeration of the right lung. Persistent and possibly worsened pneumonia on the left. Electronically Signed   By: Nelson Chimes M.D.   On: 08/02/2018 16:46   Dg Chest Port 1 View  Result Date: 07/30/2018 CLINICAL DATA:  Intubation. EXAM: PORTABLE CHEST 1 VIEW COMPARISON:  CT and chest radiographs, 08/16/2018. FINDINGS: No endotracheal tube has its tip projecting 3.8 cm above the carina. Nasal/orogastric tube passes below the diaphragm into the stomach and below the included field of view. Hazy bilateral, primarily peripherally distributed areas of ground-glass lung opacity are without change. No new lung abnormalities. IMPRESSION: 1. Well-positioned endotracheal tube  and nasal/orogastric tube. 2. No change in the appearance of the lungs. Electronically Signed    By: Lajean Manes M.D.   On: 07/30/2018 15:56   Dg Abd Portable 1v  Result Date: 08/17/2018 CLINICAL DATA:  52 year old male with abdominal distension. EXAM: PORTABLE ABDOMEN - 1 VIEW COMPARISON:  Prior abdominal radiograph 08/10/2018 FINDINGS: Well-positioned gastric tube. The tip of the tube overlies the gastric antrum in unchanged position. No significant gaseous distension or evidence of obstruction. No acute osseous abnormality. Partially imaged right femoral non tunneled hemodialysis catheter. IMPRESSION: 1. Stable position of gastric tube with the tip overlying the gastric antrum. 2. No evidence of bowel obstruction. Electronically Signed   By: Jacqulynn Cadet M.D.   On: 08/17/2018 13:42   [place] Ekg Site Rite  Result Date: 07/30/2018 If Site Rite image not attached, placement could not be confirmed due to current cardiac rhythm.   Microbiology Recent Results (from the past 240 hour(s))  SARS Coronavirus 2 [facility] order, Performed in [place] hospital lab)     Status: Abnormal   Collection Time: 08/16/18  9:54 AM  Result Value Ref Range Status   SARS Coronavirus 2 POSITIVE (A) NEGATIVE Final    Comment: RESULT CALLED TO, READ BACK BY AND VERIFIED WITH: S FRANKLIN,RN 08/16/18 1309 RHOLMES (NOTE) If result is NEGATIVE SARS-CoV-2 target nucleic acids are NOT DETECTED. The SARS-CoV-2 RNA is generally detectable in upper and lower  respiratory specimens during the acute phase of infection. The lowest  concentration of SARS-CoV-2 viral copies this assay can detect is 250  copies / mL. A negative result does not preclude SARS-CoV-2 infection  and should not be used as the sole basis for treatment or other  patient management decisions.  A negative result may occur with  improper specimen collection / handling, submission of specimen other  than nasopharyngeal swab, presence of viral mutation(s) within the  areas targeted by this assay, and inadequate number of viral copies  (<250  copies / mL). A negative result must be combined with clinical  observations, patient history, and epidemiological information. If result is POSITIVE SARS-CoV-2 target nucleic acids are DETECTED. Th e SARS-CoV-2 RNA is generally detectable in upper and lower  respiratory specimens during the acute phase of infection.  Positive  results are indicative of active infection with SARS-CoV-2.  Clinical  correlation with patient history and other diagnostic information is  necessary to determine patient infection status.  Positive results do  not rule out bacterial infection or co-infection with other viruses. If result is PRESUMPTIVE POSTIVE SARS-CoV-2 nucleic acids MAY BE PRESENT.   A presumptive positive result was obtained on the submitted specimen  and confirmed on repeat testing.  While 2019 novel coronavirus  (SARS-CoV-2) nucleic acids may be present in the submitted sample  additional confirmatory testing may be necessary for epidemiological  and / or clinical management purposes  to differentiate between  SARS-CoV-2 and other Sarbecovirus currently known to infect humans.  If clinically indicated additional testing with an alternate test  methodology [phone]) is  advised. The SARS-CoV-2 RNA is generally  detectable in upper and lower respiratory specimens during the acute  phase of infection. The expected result is Negative. Fact Sheet for Patients:  StrictlyIdeas.no Fact Sheet for Healthcare Providers: BankingDealers.co.za This test is not yet approved or cleared by the [place] FDA and has been authorized for detection and/or diagnosis of SARS-CoV-2 by FDA under an Emergency Use Authorization (EUA).  This EUA will remain in effect (meaning this  test can be used) for the duration of the COVID-19 declaration under Section 564(b)(1) of the Act, 21 U.S.C. section 360bbb-3(b)(1), unless the authorization is terminated or revoked  sooner. Performed at [facility], [place] [address], [place], [place] [address]   Culture, respiratory (non-expectorated)     Status: None   Collection Time: 08/19/18 12:29 PM  Result Value Ref Range Status   Specimen Description   Final    TRACHEAL ASPIRATE Performed at [place] [address] [place], [place] [address]    Special Requests   Final    NONE Performed at [facility], [place] [address] [place], [place] [address]    Gram Stain   Final    NO WBC SEEN NO ORGANISMS SEEN Performed at [facility], [place] [address] [place], [place] [address]    Culture   Final    MODERATE CITROBACTER KOSERI MODERATE ESCHERICHIA COLI    Report Status 08/21/2018 FINAL  Final   Organism ID, Bacteria CITROBACTER KOSERI  Final   Organism ID, Bacteria ESCHERICHIA COLI  Final      Susceptibility   Citrobacter koseri - MIC*    CEFAZOLIN <=4 SENSITIVE Sensitive     CEFEPIME <=1 SENSITIVE Sensitive     CEFTAZIDIME 2 SENSITIVE Sensitive     CEFTRIAXONE <=1 SENSITIVE Sensitive     CIPROFLOXACIN <=0.25 SENSITIVE Sensitive     GENTAMICIN 2 SENSITIVE Sensitive     IMIPENEM <=0.25 SENSITIVE Sensitive     TRIMETH/SULFA <=20 SENSITIVE Sensitive     PIP/TAZO 64 INTERMEDIATE Intermediate     * MODERATE CITROBACTER KOSERI   Escherichia coli - MIC*    AMPICILLIN >=32 RESISTANT Resistant     CEFAZOLIN <=4 SENSITIVE Sensitive     CEFEPIME <=1 SENSITIVE Sensitive     CEFTAZIDIME <=1 SENSITIVE Sensitive     CEFTRIAXONE <=1 SENSITIVE Sensitive     CIPROFLOXACIN <=0.25 SENSITIVE Sensitive     GENTAMICIN <=1 SENSITIVE Sensitive     IMIPENEM <=0.25 SENSITIVE Sensitive     TRIMETH/SULFA <=20 SENSITIVE Sensitive     AMPICILLIN/SULBACTAM 16 INTERMEDIATE Intermediate     PIP/TAZO 8 SENSITIVE Sensitive     Extended ESBL NEGATIVE Sensitive     * MODERATE ESCHERICHIA COLI    Lab Basic Metabolic Panel: Recent Labs  Lab 08/18/18 0323   08/19/18 0559  08/20/18 0347  08/20/18 1606  08/21/18 0553  08/21/18 1607  Sep 12, 2018 0205  Sep 12, 2018 0428 09/12/18 0612 2018-09-12 0616 09/12/2018 0745 12-Sep-2018 0957  NA 136  137   < > 137   < > 136   < > 136   < > 138   < > 138   < > 138   < > 138 138 137 138 138  K 5.0  5.0   < > 4.3   < > 4.4   < > 4.3   < > 3.9   < > 3.8   < > 3.8   < > 3.7 3.8 3.9 3.7 3.6  CL 96*  97*   < > 97*   < > 94*  --  95*   < > 93*   < > 93*   < > 89*   < > 88* 88* 88* 88* 85*  CO2 28  28   < > 27   < > 30  --  30  --  32  --  32  --  36*  --   --   --   --   --   --  GLUCOSE 204*  207*   < > 231*   < > 246*  --  287*   < > 238*   < > 295*   < > 276*   < > 303* 316* 293* 342* 360*  BUN 55*  55*   < > 57*   < > 52*  --  52*   < > 55*   < > 53*   < > 50*   < > 37* 38* 44* 36* 37*  CREATININE 3.52*  3.53*   < > 2.96*   < > 2.59*  --  2.50*   < > 2.59*   < > 2.48*   < > 2.27*   < > 1.90* 1.90* 2.40* 1.90* 1.90*  CALCIUM 7.6*  7.6*   < > 8.0*   < > 7.9*  --  7.9*  --  8.5*  --  8.4*  --  8.6*  --   --   --   --   --   --   MG 2.1  --  2.2  --  1.9  --   --   --  2.1  --   --   --  2.0  --   --   --   --   --   --   PHOS 5.1*   < > 2.4*   < > 1.9*  --  1.9*  --  2.0*  --  2.3*  --  2.6  --   --   --   --   --   --    < > = values in this interval not displayed.   Liver Function Tests: Recent Labs  Lab 08/20/18 0347 08/20/18 1606 08/21/18 0553 08/21/18 1607 09/05/18 0205  ALBUMIN 1.7* 1.6* 1.6* 1.6* 1.6*   No results for input(s): LIPASE, AMYLASE in the last 168 hours. No results for input(s): AMMONIA in the last 168 hours. CBC: Recent Labs  Lab 08/18/18 2143  08/19/18 0559  08/20/18 0347  08/21/18 0553  Sep 05, 2018 0205  Sep 05, 2018 0428 09-05-18 0612 09-05-2018 0616 Sep 05, 2018 0745 09-05-18 0957  WBC 0.6*  --  0.5*  --  0.4*  --  0.4*  --  0.3*  --   --   --   --   --   --   HGB 6.6*   < > 7.6*   < > 7.0*   < > 7.0*   < > 7.1*   < > 7.5* 8.2* 7.5* 7.8* 7.8*  HCT 21.6*   < > 24.3*   < > 23.2*   < >  23.5*   < > 24.0*   < > 22.0* 24.0* 22.0* 23.0* 23.0*  MCV 96.4  --  94.2  --  93.9  --  94.8  --  96.4  --   --   --   --   --   --   PLT 129*  --  111*  --  97*  --  104*  --  93*  --   --   --   --   --   --    < > = values in this interval not displayed.   Cardiac Enzymes: No results for input(s): CKTOTAL, CKMB, CKMBINDEX, TROPONINI in the last 168 hours. Sepsis Labs: Recent Labs  Lab 08/18/18 0323  08/19/18 0559 08/20/18 0347 08/21/18 0553 09-05-18 0205  PROCALCITON 3.70  --   --   --   --   --  WBC 1.0*   < > 0.5* 0.4* 0.4* 0.3*   < > = values in this interval not displayed.    Procedures/Operations   ETT 4/2 >>  L IJ HD 4/6 >> 4/12 R Rad [place] 4/6 >>  4/12 right femoral HD catheter 24 cm>> Right IJ triple-lumen dialysis catheter 4/21>>>  [name] Long L Litsy Epting 08/24/2018, 1:54 PM

## 2018-08-28 NOTE — Procedures (Signed)
Extubation Procedure Note  Patient Details:   Name: Courvoisier Hamblen DOB: 10-Nov-1965 MRN: [id number]   Airway Documentation:  Airway 7.5 mm (Active)  Secured at (cm) 25 cm Sep 05, 2018  2:46 PM  Measured From Lips 09/05/2018  2:46 PM  Secured Location Right 05-Sep-2018  2:46 PM  Secured By [employer] 09/05/18  2:46 PM  Tube Holder Repositioned Yes Sep 05, 2018  2:46 PM  Cuff Pressure (cm H2O) 23 cm H2O 08/21/2018  9:14 PM  Site Condition Dry 09-05-2018  2:46 PM   Vent end date: (not recorded) Vent end time: (not recorded)   Evaluation  O2 sats: patient aystole before withdrawal of tube Complications: No apparent complications Patient did tolerate procedure well. Bilateral Breath Sounds: Diminished, Rhonchi   No  Leida Lauth 09/05/18, 6:36 PM

## 2018-08-28 NOTE — Progress Notes (Signed)
Spoke with [name] Long, Infection Prevention RN, regarding whether or not [name] Long could visit patient tomorrow for withdrawal of care. [name] Long has stated she has tested covid positive in the community but has finished her 14 day quarantine on 4/17. States she has been without any symptoms since 4/12. Per [name] Hammans RN it is okay for [name] Long to come up and see [name] Long as long as she wears a mask at all times and then be provided with appropriate PPE when she enters room.   Awaiting return call from [name] regarding if [name] Long [name] Long can have a second person with her when she visits. Once we have this information [name] Long [name] Long is to be called and informed.  Passed this information along to oncoming RN who will relay the message to the [name] Long once we have all information.

## 2018-08-28 NOTE — Progress Notes (Signed)
NAME:  [name] Long, MRN:  [id number], DOB:  [birth year], LOS: 24 ADMISSION DATE:  08/21/2018, CONSULTATION DATE:  07/30/2018 REFERRING MD:  07/30/2018, CHIEF COMPLAINT:   Brief History   53 yo male presented to [facility] with several days of abdominal pain from diverticulitis with retained perforation.  Developed pneumonia with hypoxia from [place].  Transferred to [facility]. Actemra not given due to diverticulitis  Past Medical History  DM2, HTN, Former smoker, OSA, A fib  [facility] Events   4/01 admission for diverticulitis 4/02 PCCM consulted for worsening dyspnea, hypoxemia 4/02 Proned  4/03 Proned  4/05 Improved oxygenation however ABG with respiratory and metabolic acidosis. Poor UOP 4/06 Supine; completed plaquenil 4/07 CVVHD, difficulty with filters clotting  4/08 CVVHD ongoing, AFwRVR afternoon of 4/8, started on amio gtt   4/12 hemodialysis catheter changed to right femoral location; brief episode of PEA resolved sponateously 08/11/2018 hemodialysis catheter and filter for CRRT machine or clotting off. 08/11/2018 started on heparin drip for atrial fibrillation in filter clotting on CRRT machine 4/18- Was off sedation transiently 4/17, then had to be started on Precedex. He has tolerated pressure support 8 very well, used PRVC auto mode for the rest of the day yesterday 4/17 Had some agitation requiring increased fentanyl, sedation overnight.  Resultant hypotension and low-dose norepinephrine started.  Now weaned to off.  4/19: had episode of bradycardia, hypotension, hypoxia. Etiology unclear. Started back on pressors and deep sedation.  4/20: X-ray looks worse.  Added HCAP coverage on 4/19, still requiring high PEEP and FiO2 4/21:peep and fio2 down, remains heavily sedated, still on pressors.  Placed new dialysis catheter as femoral had been clotting off 4/22: Weaning off pressors.  Again we will taper back sedation once again.  Still unresponsive from aggressive sedation, did get 1  unit of blood 4/23: Spoke to wife over the phone in regards to goals of care.  Starting to entertain withdrawal.  No significant changes overnight. 4/24: Holding off on escalation of care.  Discussion for withdrawal later when family can no be available  Consults:  Renal GI signed off Surgery signed off  Procedures:  ETT 4/2 >>  L IJ HD 4/6 >> 4/12 R Rad Aline 4/6 >>  4/12 right femoral HD catheter 24 cm>> Right IJ triple-lumen dialysis catheter 4/21>>>  Significant Diagnostic Tests:  CT chest 4/01 [name]) >> b/l GGO in periphery CT abd/pelvis 4/01 [name]) >> perforated diverticulum lower descending colon, fatty liver with ?cirrhosis, splenomegaly 18.4 cm  Micro Data:  COVID 3/30 (PCP office) >> DETECTED Blood 4/12 >> neg COVID at [facility] 08/16/2018 (repeat for clearance) - POSITIVE 4/22 respiratory culture: ecoli and citrobacter (with resistance to zosyn and unasyn)  Antimicrobials:   Zosyn 4/1 >> 4/7 Zoysn 4/12 >>4/17 Zosyn 4/19 >4/23 Meropenem 4/34>>>4/24 Cefepime 4/24>>> Vancomycin 4/19 > 4/23  Interim history/subjective:  No issues overnight. Discussed care with nursing as well as nephrology. Plans to stop CRRT today.   Objective   Blood pressure (!) 120/54, pulse (!) 107, temperature 99 F (37.2 C), temperature source Axillary, resp. rate 12, height 5\' 10"  (1.778 m), weight 126.2 kg, SpO2 100 %. CVP:  [10 mmHg-17 mmHg] 16 mmHg  Vent Mode: PRVC FiO2 (%):  [50 %] 50 % Set Rate:  [20 bmp] 20 bmp Vt Set:  [510 mL] 510 mL PEEP:  [10 cmH20] 10 cmH20 Plateau Pressure:  [16 cmH20-21 cmH20] 16 cmH20   Intake/Output Summary (Last 24 hours) at 2018/08/30 1153 Last data filed at 08/30/2018 1100 Gross per  24 hour  Intake 4363.84 ml  Output 5227 ml  Net -863.16 ml   Filed Weights   08/20/18 0426 08/21/18 0500 2018-08-31 0500  Weight: 132.3 kg 126.8 kg 126.2 kg   Physical exam    General: middle aged male, resting in bed, no distress  HEENT: NCAT, ETT in place   Pulmonary: diminished BL breaths  Cardiac: RRR, no MRG  Abdomen: soft, nt nd  GU: foley  Extremities: BL UE and LE edema  Neuro: sedated on vent, not responsive to voice, does withdraw to pain   Resolved Hospital Problem list   Mixed Acidosis, ARDS from COVID-19 Septic shock resolved 4/22  Assessment & Plan:   Acute hypoxemic respiratory failure due to  COVID-19 infection (repeat test for clearance 4/19 remains positive) complicated by presumed ventilator associated pneumonia and ARDS -No significant change with FiO2 and PEEP requirements Plan Continue ARDS Protocol  Weaning from vent as tolerated  VAP ppx  Volume removal with crrt, but plans to stop today per nephrology  Continue ABX However further discussions with family per documentation they plan for comfort care measures tomorrow   Acute metabolic encephalopathy Plan Continue PAD protocol, dilaudid and versed for sedation   Atrial fibrillation with RVR, self converted to sinus  Plan On amiodarone  Holding systemic ac   Acute renal failure; now w/ HD access issues as of 4/21 Plan CRRT stopped per nephrology today   Anemia critical illness now maybe a component of acute blood loss as well. Some bloody plug in ET suction and melenic stools. Occult blood testing positive.  -hgb transfused 4/21, hemoglobin drifting down again Plan Hold any additional blood products  No escalation of care   Leukopenia in setting of sepsis Plan Trend cbc  DM Plan ssi   MSK/DERM Plan Ostomy nurse recs   Best practice:  Diet: NPO, tube feedings trickle DVT prophylaxis: SCDs GI prophylaxis: pantoprazole Mobility: bed rest Code Status: No CPR or defibrillation but full medical care Family Communication: I will attempt to call wife. I do understand per nursing that the family is planning to withdrawal tomorrow and are currently in a funeral service for a different family member today.  Disposition: ICU,plans for withdrawal of  care 4/26  No escalation of care per documentation from [facility] team yesterday.    Garner Nash, DO [facility] 08/31/18 11:53 AM  Personal pager: [phone] If unanswered, please page CCM On-call: [phone]

## 2018-08-28 NOTE — Progress Notes (Signed)
Wife called again, given the number of bed placement to call when she is ready with funeral home name. Pt's belongings: Cell phone, charger, shirt, pants and shoes are with the body. Wife is aware.  Diladid 27ml and Versed 32ml were wasted and witnessed by Kris Mouton, RN

## 2018-08-28 NOTE — Progress Notes (Addendum)
PCCM:  I called and spoke with the wife regarding updates for her husband.   She is agrees to no escalation of care. If he looks like he is going to pass she would like to be notified.   They are making arrangements on being here tomorrow morning to withdraw care.   I did inform her of his ongoing hypotension and that if he continues to decline then he may pass before tomorrow. She understands and would like for [place] to keep him comfortable until tomorrow when she can be here if possible.   Garner Nash, DO [facility] 09-10-18 4:09 PM     Notified by RT that ET tube is kinking and they cannot get it straightened out. Patient is not being adequately ventilated.   I called and explained the situation to the patients wife. She would like to move forward with palliative extubation this evening.   I have called the floor nursing staff and requested they call the patients wife to document two person verbal consent via phone for palliative withdrawal.  Comfort medications with dilaudid and versed.   Extubation orders placed.  [facility] 09-10-18 6:02 PM

## 2018-08-28 NOTE — Progress Notes (Signed)
Patient ID: [name] Long, male   DOB: 16-Jul-1965, 53 y.o.   MRN: [id number] S: PCCM discussions with family and planning on withdrawal of care tomorrow after family arrives. O:BP (!) 120/54   Pulse (!) 110   Temp 99.8 F (37.7 C) (Axillary)   Resp 15   Ht '5\' 10"'  (1.778 m)   Wt 126.2 kg   SpO2 100%   BMI 39.92 kg/m   Intake/Output Summary (Last 24 hours) at 09/12/18 1104 Last data filed at 09/12/18 1000 Gross per 24 hour  Intake 4205.28 ml  Output 5027 ml  Net -821.72 ml   Intake/Output: I/O last 3 completed shifts: In: 6644.3 [P.O.:170; I.V.:3303.2; Other:250; NG/GT:2520; IV Piggyback:401] Out: 0737 [TGGYI:9485; Stool:740]  Intake/Output this shift:  Total I/O In: 576 [I.V.:281; NG/GT:295] Out: 651 [Other:651] Weight change: -0.6 kg Gen: intubated and sedated Direct physical contact was not performed due to covid-19 infection.  Case discussed with PCCM staff to aid in clinical decision making.  Recent Labs  Lab 08/17/18 0430  08/19/18 0559  08/19/18 2030  08/20/18 0347  08/20/18 1606  08/21/18 0553  08/21/18 1607  12-Sep-2018 0205 12-Sep-2018 0213 09/12/18 0218 09-12-2018 0420 09-12-2018 0428 09-12-18 0612 09/12/2018 0616 09/12/2018 0745  NA 136   < > 137   < > 137   < > 136   < > 136   < > 138   < > 138   < > 138 137 139 137 138 138 137 138  K 4.0   < > 4.3   < > 4.0   < > 4.4   < > 4.3   < > 3.9   < > 3.8   < > 3.8 3.8 3.8 4.1 3.7 3.8 3.9 3.7  CL 100   < > 97*  --  96*  --  94*  --  95*   < > 93*   < > 93*   < > 89* 89* 87* 89* 88* 88* 88* 88*  CO2 25   < > 27  --  28  --  30  --  30  --  32  --  32  --  36*  --   --   --   --   --   --   --   GLUCOSE 245*   < > 231*  --  203*  --  246*  --  287*   < > 238*   < > 295*   < > 276* 287* 315* 275* 303* 316* 293* 342*  BUN 37*   < > 57*  --  54*  --  52*  --  52*   < > 55*   < > 53*   < > 50* 42* 37* 57* 37* 38* 44* 36*  CREATININE 2.68*   < > 2.96*  --  2.78*  --  2.59*  --  2.50*   < > 2.59*   < > 2.48*   < > 2.27* 2.20* 1.90*  2.40* 1.90* 1.90* 2.40* 1.90*  ALBUMIN 2.0*  2.0*   < > 2.1*  --  1.9*  --  1.7*  --  1.6*  --  1.6*  --  1.6*  --  1.6*  --   --   --   --   --   --   --   CALCIUM 6.9*   < > 8.0*  --  7.7*  --  7.9*  --  7.9*  --  8.5*  --  8.4*  --  8.6*  --   --   --   --   --   --   --   PHOS 3.7   < > 2.4*  --  1.4*  --  1.9*  --  1.9*  --  2.0*  --  2.3*  --  2.6  --   --   --   --   --   --   --   AST 15  --   --   --   --   --   --   --   --   --   --   --   --   --   --   --   --   --   --   --   --   --   ALT 25  --   --   --   --   --   --   --   --   --   --   --   --   --   --   --   --   --   --   --   --   --    < > = values in this interval not displayed.   Liver Function Tests: Recent Labs  Lab 08/17/18 0430  08/21/18 0553 08/21/18 1607 10-Sep-2018 0205  AST 15  --   --   --   --   ALT 25  --   --   --   --   ALKPHOS 59  --   --   --   --   BILITOT 1.3*  --   --   --   --   PROT 5.5*  --   --   --   --   ALBUMIN 2.0*  2.0*   < > 1.6* 1.6* 1.6*   < > = values in this interval not displayed.   No results for input(s): LIPASE, AMYLASE in the last 168 hours. No results for input(s): AMMONIA in the last 168 hours. CBC: Recent Labs  Lab 08/18/18 2143  08/19/18 0559  08/20/18 0347  08/21/18 0553  2018/09/10 0205  10-Sep-2018 0612 2018/09/10 0616 Sep 10, 2018 0745  WBC 0.6*  --  0.5*  --  0.4*  --  0.4*  --  0.3*  --   --   --   --   HGB 6.6*   < > 7.6*   < > 7.0*   < > 7.0*   < > 7.1*   < > 8.2* 7.5* 7.8*  HCT 21.6*   < > 24.3*   < > 23.2*   < > 23.5*   < > 24.0*   < > 24.0* 22.0* 23.0*  MCV 96.4  --  94.2  --  93.9  --  94.8  --  96.4  --   --   --   --   PLT 129*  --  111*  --  97*  --  104*  --  93*  --   --   --   --    < > = values in this interval not displayed.   Cardiac Enzymes: No results for input(s): CKTOTAL, CKMB, CKMBINDEX, TROPONINI in the last 168 hours. CBG: Recent Labs  Lab 08/21/18 1614 08/21/18 1942 08/21/18 2230 Sep 10, 2018 0413 Sep 10, 2018 0740  GLUCAP 273* 271* 267*  243* 282*    Iron Studies: No results for input(s):  IRON, TIBC, TRANSFERRIN, FERRITIN in the last 72 hours. Studies/Results: No results found. . chlorhexidine gluconate (MEDLINE KIT)  15 mL Mouth Rinse BID  . Chlorhexidine Gluconate Cloth  6 each Topical Daily  . feeding supplement (PRO-STAT SUGAR FREE 64)  60 mL Per Tube BID  . heparin injection (subcutaneous)  5,000 Units Subcutaneous Q12H  . insulin aspart  0-9 Units Subcutaneous Q4H  . mouth rinse  15 mL Mouth Rinse 10 times per day  . pantoprazole (PROTONIX) IV  40 mg Intravenous Q24H  . sodium chloride flush  10-40 mL Intracatheter Q12H    BMET    Component Value Date/Time   NA 138 Sep 20, 2018 0745   K 3.7 Sep 20, 2018 0745   CL 88 (L) 20-Sep-2018 0745   CO2 36 (H) 09-20-2018 0205   GLUCOSE 342 (H) 09/20/18 0745   BUN 36 (H) 09/20/2018 0745   CREATININE 1.90 (H) 2018-09-20 0745   CALCIUM 8.6 (L) Sep 20, 2018 0205   GFRNONAA 32 (L) 09/20/2018 0205   GFRAA 37 (L) 09/20/2018 0205   CBC    Component Value Date/Time   WBC 0.3 (LL) 09-20-2018 0205   RBC 2.49 (L) 09/20/2018 0205   HGB 7.8 (L) 20-Sep-2018 0745   HCT 23.0 (L) 20-Sep-2018 0745   PLT 93 (L) 09-20-2018 0205   MCV 96.4 20-Sep-2018 0205   MCH 28.5 Sep 20, 2018 0205   MCHC 29.6 (L) 09/20/18 0205   RDW 19.3 (H) 20-Sep-2018 0205   LYMPHSABS 0.6 (L) 08/13/2018 0420   MONOABS 0.4 08/13/2018 0420   EOSABS 0.1 08/13/2018 0420   BASOSABS 0.0 08/13/2018 0420    Assessment/Plan:  1. ARF in setting of coronavirus sepsis. Presumably due to ischemic ATN although unclear if there is direct viral injury with covid-19. He has been receiving CVVHD since 08/02/18 in setting of worsening respiratory failure and metabolic acidosis.  1. HD catheter was changed to right fem site on 08/09/18 andagainon4/21/20 due to poor function and frequent clotting. 2. Given plans to withdrawal tomorrow will stop CVVHD today as his electrolytes are stable  3. Will sign off for now.  Please call  with any questions or concerns or if plans change.  2. Coronavirus PNA with sepsis and VDRF- intubated since 08/02/18.  3. ARDS- concern for possible HCAP on top of covid-19. Now on zosyn and vancomycin per PCCM. Will likely require prone ventilation again. 4. Septic shock- vasopressin added due to Afib with RVR related to levophed. Per PCCM added HCAP coverage started 4/19 5. ABLA- off of heparin.  6. Hypophosphatemia- replete as needed 7. Disposition- poor overall prognosis.Agree with starting discussion with family about withdrawing care and transitioning to comfort measures 08/23/18. 8. Perforated diverticulum  Donetta Potts, MD [facility] [phone]

## 2018-08-28 NOTE — Progress Notes (Signed)
Vent alarm. RT called and at the bedside. Suction catheter cannot get through. Per RT, the only reintubation will help at this time. MD notified. Wife called and updated and decided to keep him comfortable, remove tube. Emotional support given. Will use phone on speaker to speak to her husband

## 2018-08-28 NOTE — Progress Notes (Signed)
Called to bedside by RN. Vent alarming and cannot pass suction catheter at this time. Lavaged with saline and tried to pass catheter but would stop at a certain point and not go through ET tube. Patient's sats 100% but tidal volumes were half of what he was set at. Second RT came in and also tried to pass catheter with no luck. Patient supposed to be a withdrawal tomorrow- MD called and wanted to call wife to see if we needed to change tube or we could withdraw at this time. Wife wanted [place] to extubate. Withdrew care at 1820.

## 2018-08-28 DEATH — deceased

## 2018-09-03 ENCOUNTER — Telehealth: Payer: Self-pay | Admitting: *Deleted

## 2018-09-03 NOTE — Telephone Encounter (Signed)
Received original D/C from [facility]-D/C forwarded to [facility] [place] Fore) to sign for Dr.Icard.

## 2018-09-04 NOTE — Telephone Encounter (Signed)
Correction-D/C Mailed to [facility] vital records as requested

## 2018-09-04 NOTE — Telephone Encounter (Signed)
Received original Signed D/C-funeral home notified for pick up.

## 2018-11-30 LAB — BLOOD GAS, ARTERIAL
Drawn by: 441261
FIO2: 45
MECHVT: 510 mL
O2 Saturation: 93.3 %
PEEP: 15 cmH2O
Patient temperature: 37
RATE: 35 resp/min
pCO2 arterial: 57.4 mmHg — ABNORMAL HIGH (ref 32.0–48.0)
pH, Arterial: 7.187 — CL (ref 7.350–7.450)
pO2, Arterial: 77.2 mmHg — ABNORMAL LOW (ref 83.0–108.0)

## 2020-05-08 IMAGING — DX PORTABLE CHEST - 1 VIEW
2 series · 2 of 2 positions shown · non-contrast
Comparison: 07/30/2018

CLINICAL DATA: Central line placement.

EXAM:
PORTABLE CHEST 1 VIEW

[chest ap (1 of 2)]
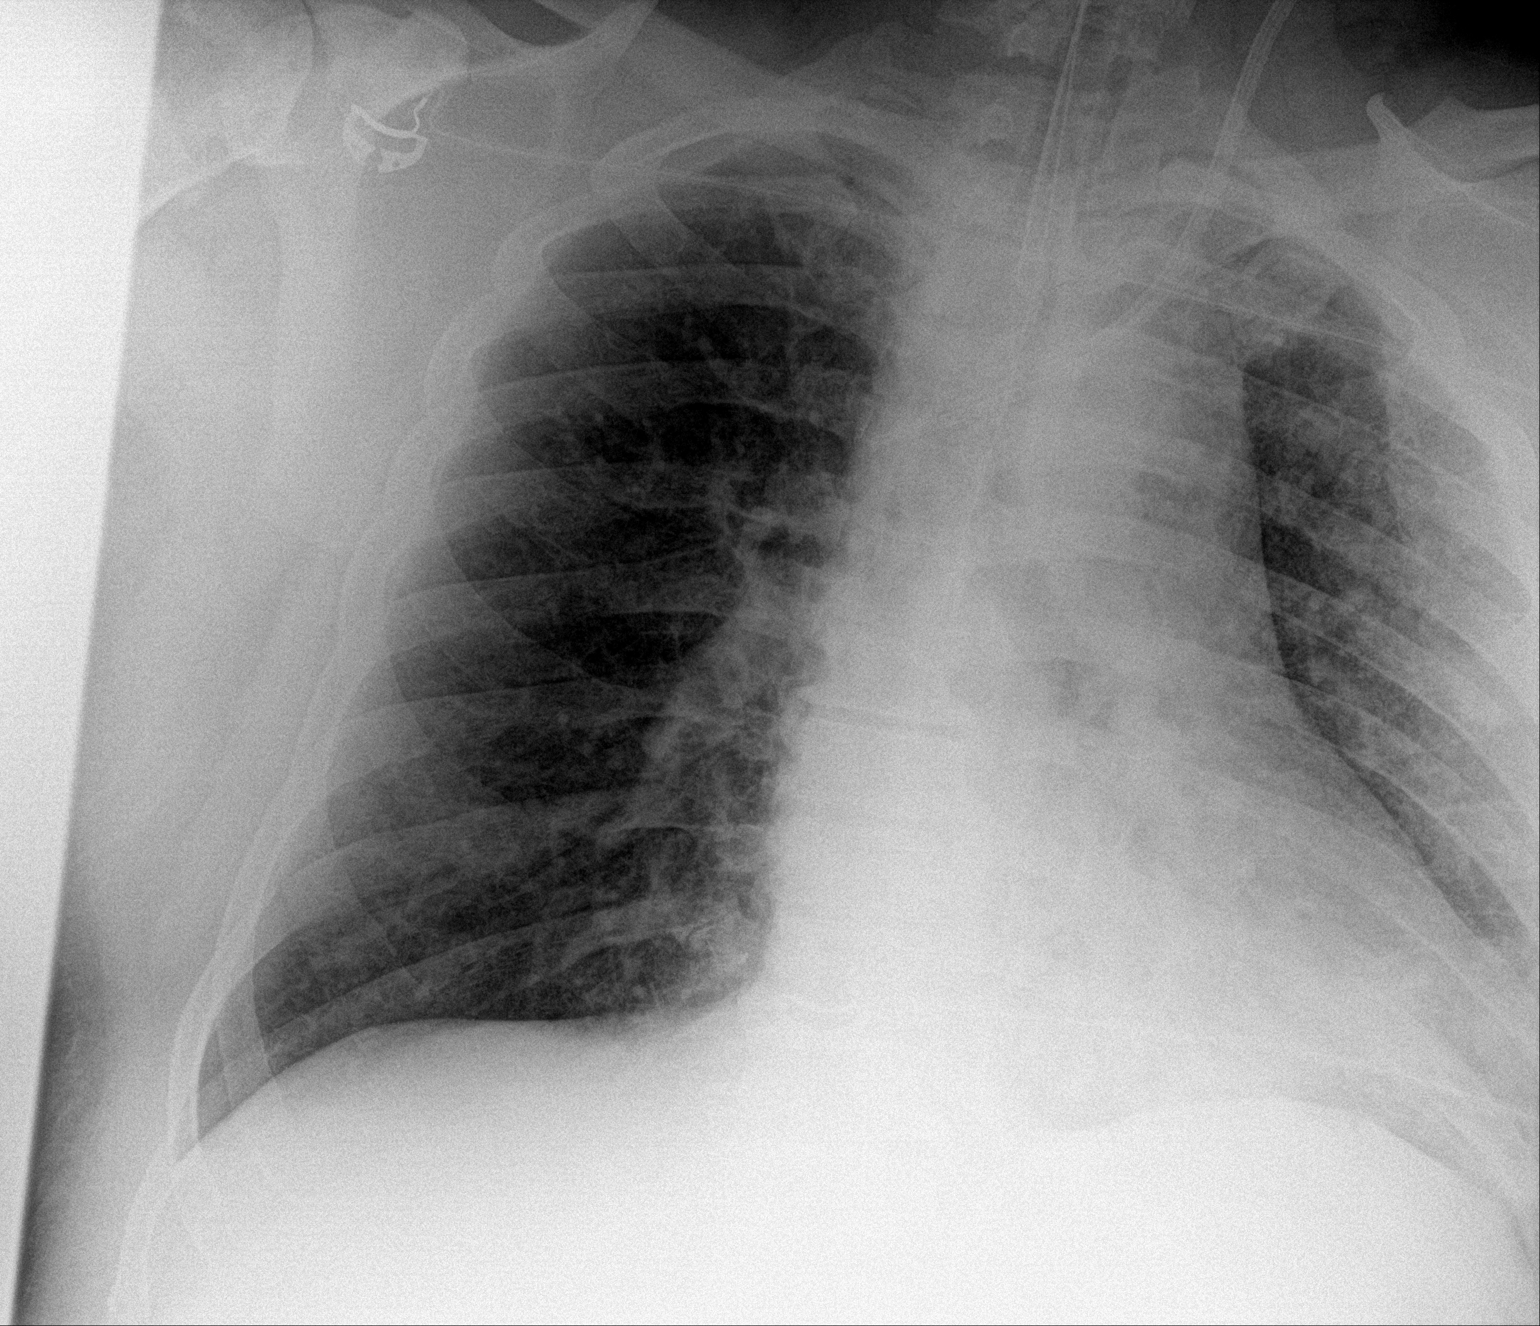

[chest ap (2 of 2)]
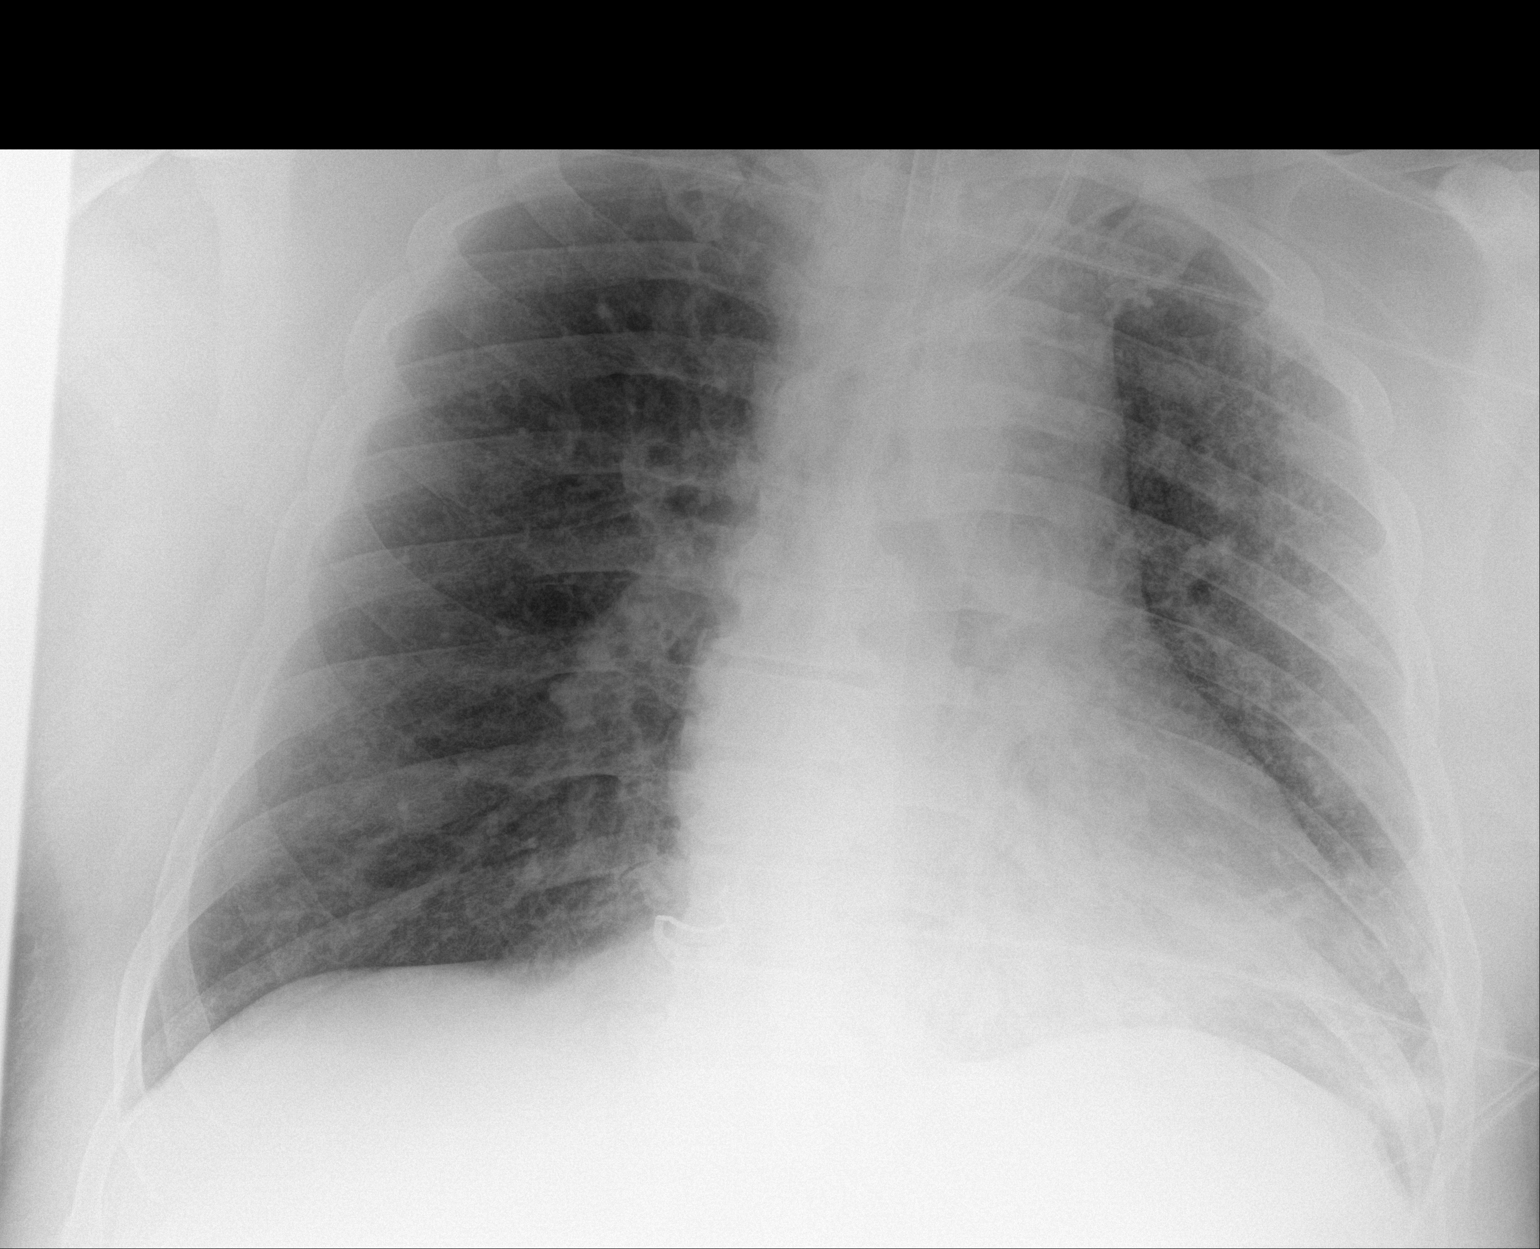

[2 of 2 positions shown; findings below may reference images not displayed]

FINDINGS: Left internal jugular central line tip is in the SVC at the azygos
level. Endotracheal tube tip 4 cm above the carina. Orogastric or
nasogastric tube enters the abdomen. Right lung appears improved
with less edema. There is persistent and possibly worsened pneumonia
on the left, particularly in the lower lobe.
IMPRESSION: Left internal jugular central line tip in the SVC at the azygos
level. No pneumothorax.

Endotracheal tube and nasogastric or orogastric tube satisfactory.

Improved aeration of the right lung. Persistent and possibly
worsened pneumonia on the left.

## 2020-05-14 IMAGING — DX ABDOMEN - 1 VIEW
3 series · 3 of 3 positions shown · non-contrast
Comparison: None.

CLINICAL DATA: Abdominal distention. H/o Diabetes Type 2. Pt is
5ovid-EH Positive

EXAM:
ABDOMEN - 1 VIEW

[abdomen kub (1 of 3)]
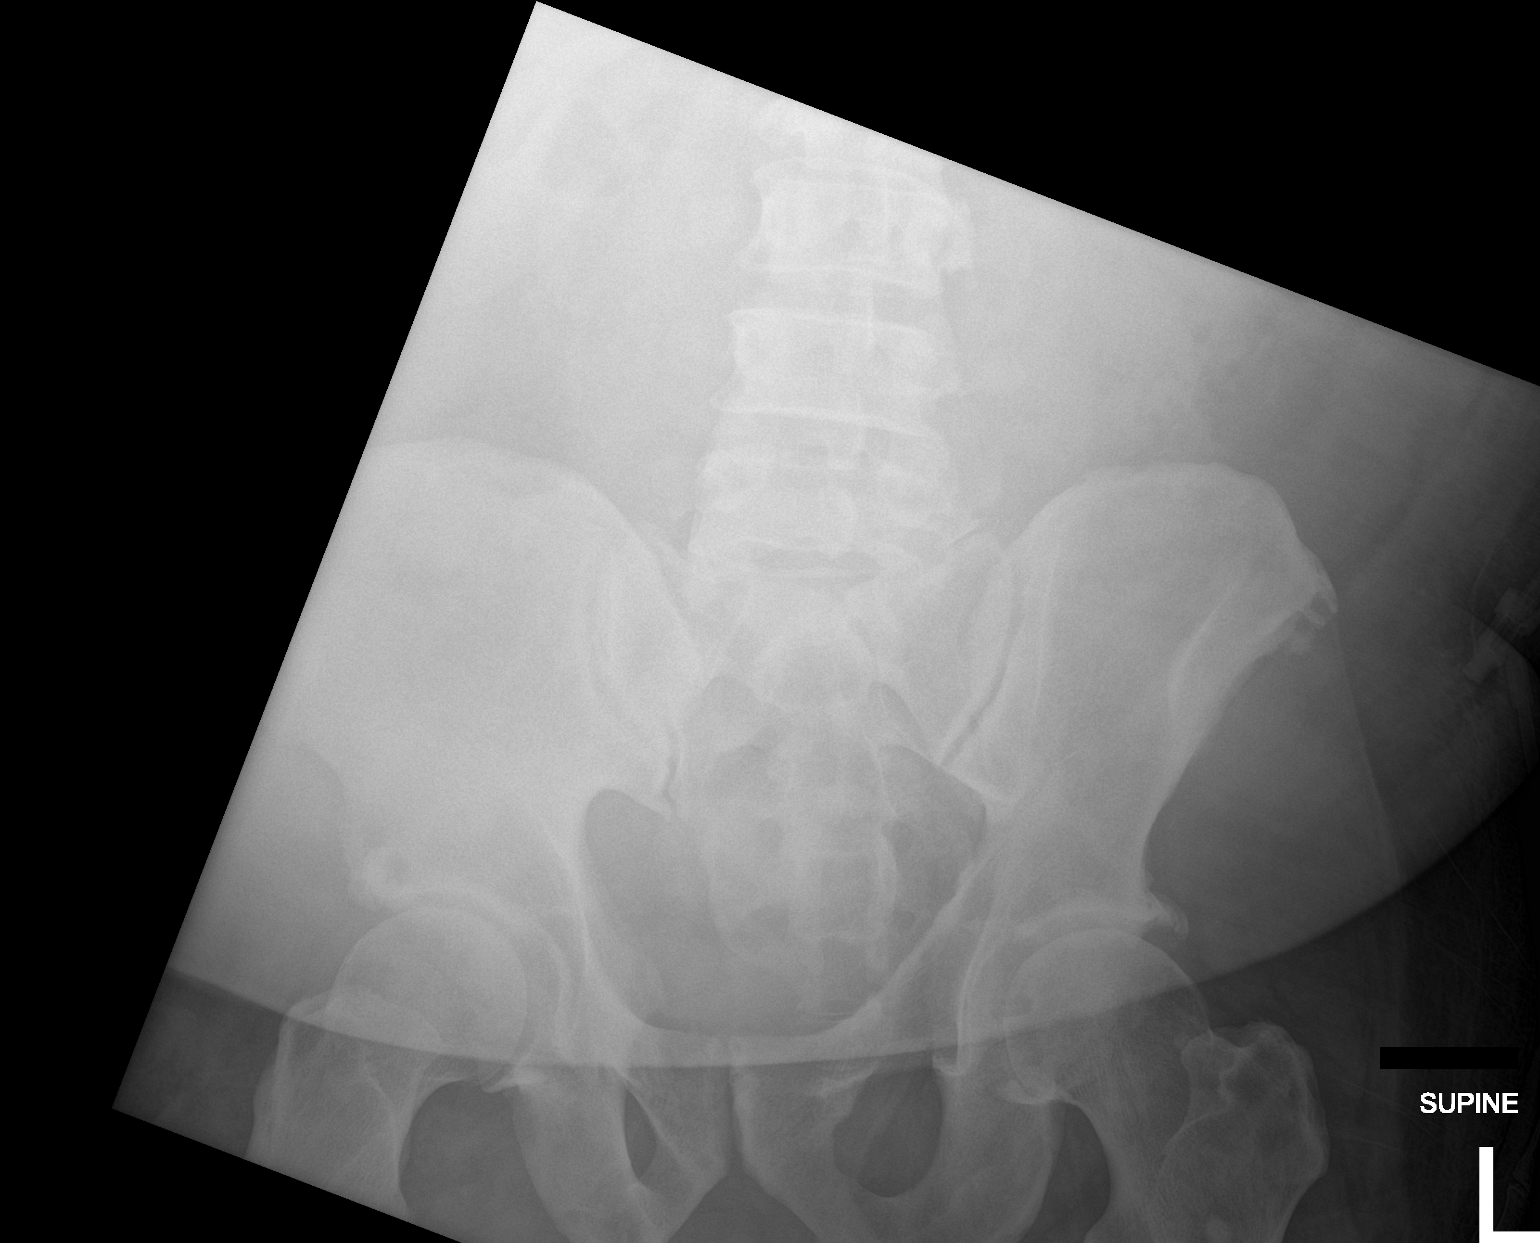

[abdomen kub (2 of 3)]
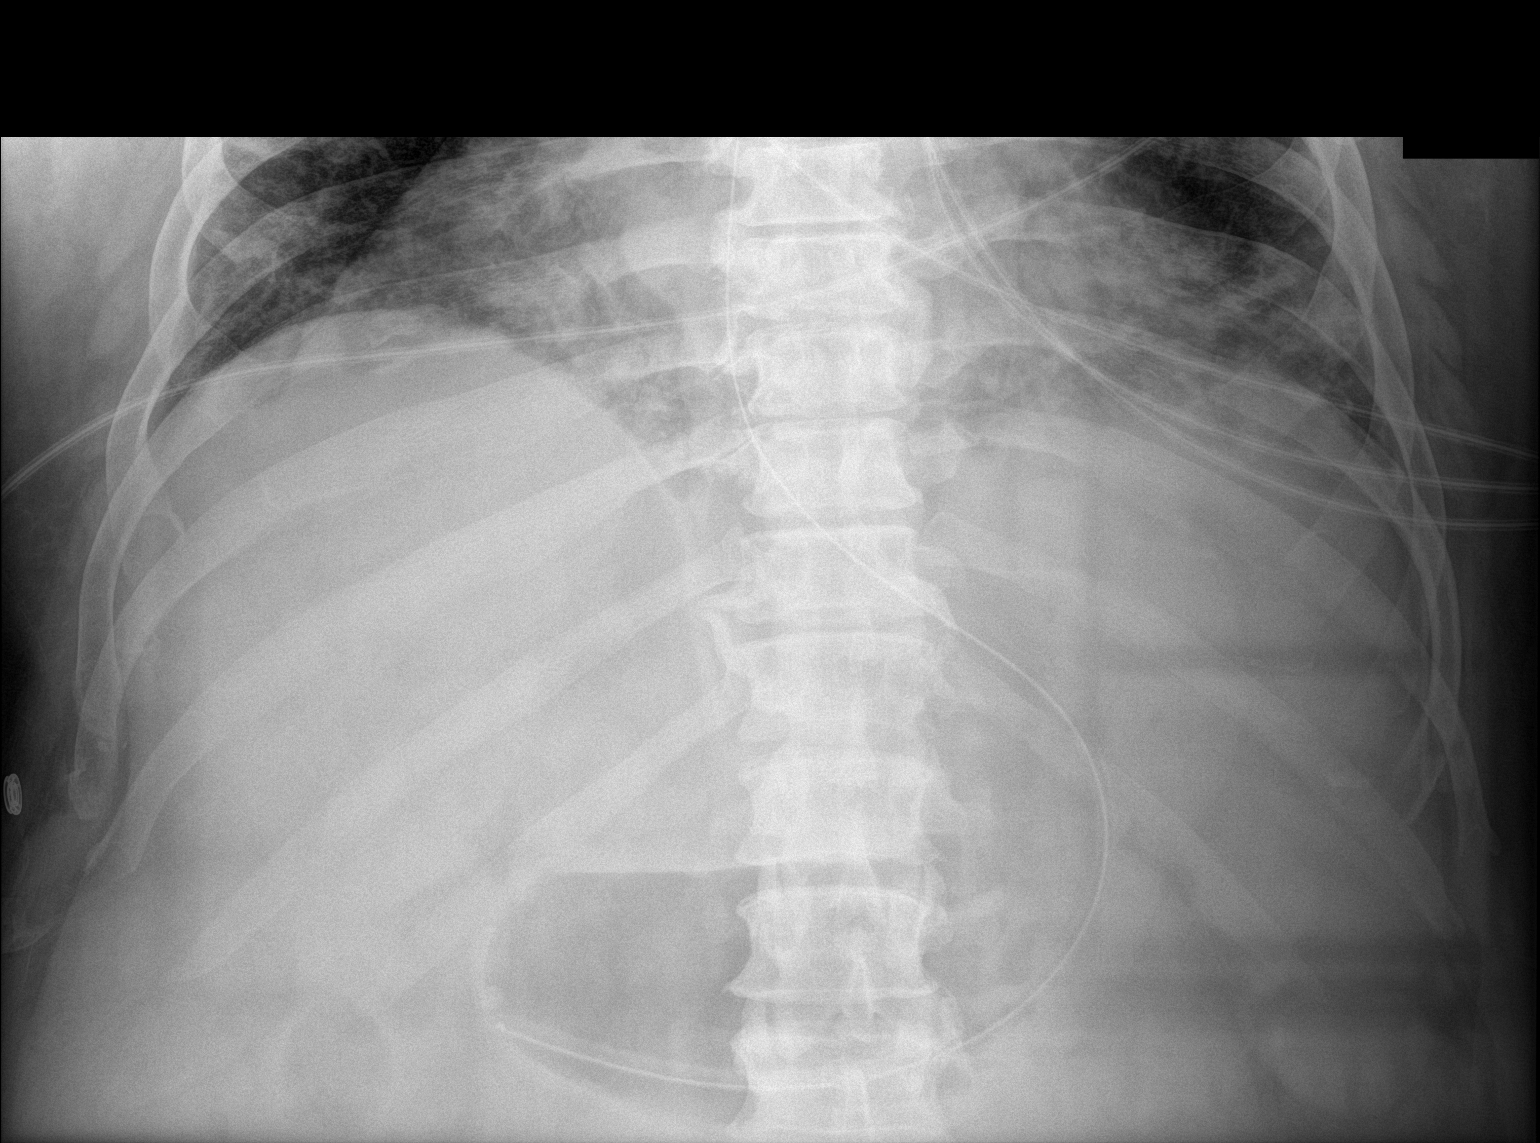

[abdomen kub (3 of 3)]
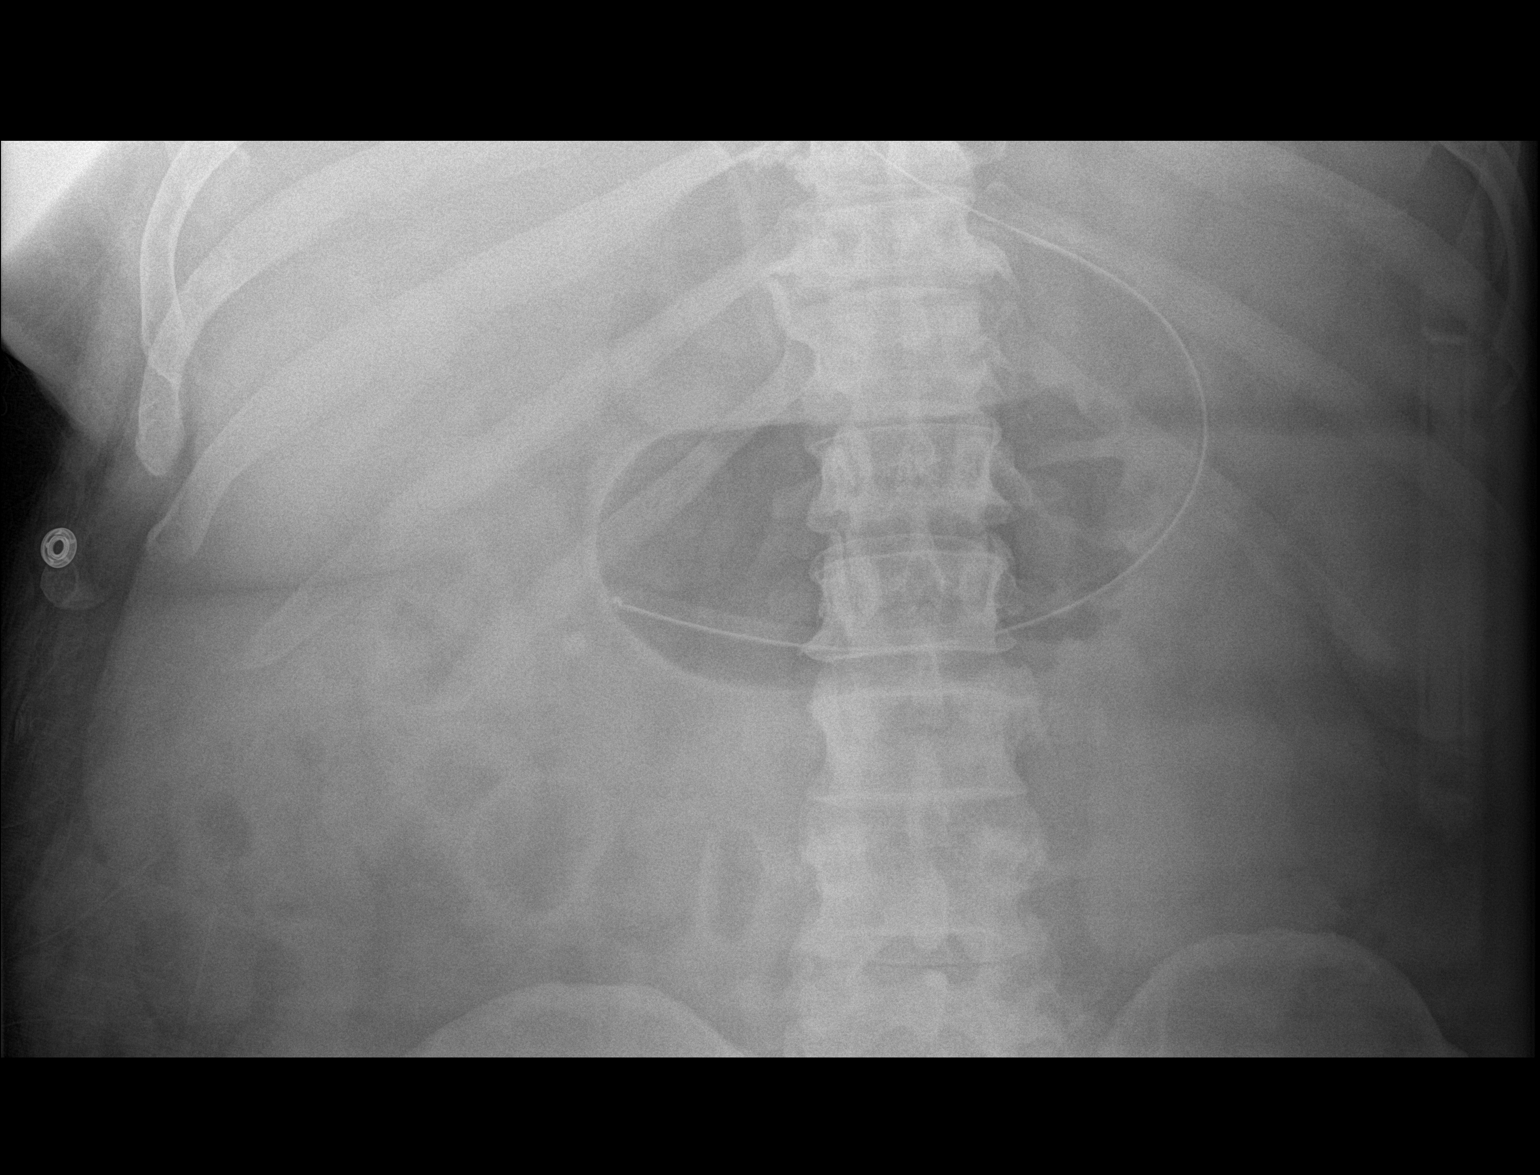

[3 of 3 positions shown; findings below may reference images not displayed]

FINDINGS: Enteric tube appears adequately positioned in the stomach. Paucity
of bowel gas. No dilated large or small bowel loops seen. No
evidence of free intraperitoneal air seen. No evidence of renal or
ureteral calculi. Osseous structures are unremarkable.
IMPRESSION: 1. Enteric tube adequately positioned in the stomach.
2. Paucity of bowel gas. No dilated large or small bowel loops seen.
